# Patient Record
Sex: Male | Born: 1990 | Race: White | Hispanic: No | Marital: Married | Smoking: Current every day smoker
Health system: Southern US, Community
[De-identification: ages and names within clinical notes are randomized; demographics above are authoritative.]

## PROBLEM LIST (undated history)

## (undated) ENCOUNTER — Ambulatory Visit: Admission: EM | Payer: Medicaid Other | Source: Home / Self Care

## (undated) ENCOUNTER — Ambulatory Visit: Discharge status: Absent without leave | Payer: Self-pay

## (undated) DIAGNOSIS — M549 Dorsalgia, unspecified: Secondary | ICD-10-CM

## (undated) DIAGNOSIS — R0781 Pleurodynia: Secondary | ICD-10-CM

## (undated) DIAGNOSIS — F4310 Post-traumatic stress disorder, unspecified: Secondary | ICD-10-CM

## (undated) DIAGNOSIS — E119 Type 2 diabetes mellitus without complications: Secondary | ICD-10-CM

## (undated) DIAGNOSIS — J45909 Unspecified asthma, uncomplicated: Secondary | ICD-10-CM

## (undated) DIAGNOSIS — F419 Anxiety disorder, unspecified: Secondary | ICD-10-CM

## (undated) HISTORY — PX: TONSILLECTOMY: SUR1361

## (undated) HISTORY — PX: ADENOIDECTOMY: SUR15

---
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
## 2009-06-18 ENCOUNTER — Inpatient Hospital Stay (HOSPITAL_COMMUNITY): Admission: EM | Admit: 2009-06-18 | Discharge: 2009-06-20 | Payer: Self-pay | Admitting: Emergency Medicine

## 2009-10-10 ENCOUNTER — Emergency Department (HOSPITAL_COMMUNITY): Admission: EM | Admit: 2009-10-10 | Discharge: 2009-10-11 | Payer: Self-pay | Admitting: Emergency Medicine

## 2009-10-29 ENCOUNTER — Emergency Department (HOSPITAL_COMMUNITY): Admission: EM | Admit: 2009-10-29 | Discharge: 2009-10-29 | Payer: Self-pay | Admitting: Emergency Medicine

## 2010-04-08 ENCOUNTER — Emergency Department (HOSPITAL_COMMUNITY)
Admission: EM | Admit: 2010-04-08 | Discharge: 2010-04-08 | Payer: Self-pay | Source: Home / Self Care | Admitting: Emergency Medicine

## 2010-04-09 ENCOUNTER — Emergency Department (HOSPITAL_COMMUNITY): Admission: EM | Admit: 2010-04-09 | Discharge: 2010-03-17 | Payer: Self-pay | Admitting: Emergency Medicine

## 2010-04-09 ENCOUNTER — Emergency Department (HOSPITAL_COMMUNITY): Admission: EM | Admit: 2010-04-09 | Discharge: 2010-03-06 | Payer: Self-pay | Admitting: Emergency Medicine

## 2010-06-19 ENCOUNTER — Emergency Department (HOSPITAL_COMMUNITY)
Admission: EM | Admit: 2010-06-19 | Discharge: 2010-06-19 | Disposition: A | Discharge status: Home / Self Care | Payer: Self-pay | Attending: Emergency Medicine | Admitting: Emergency Medicine

## 2010-06-19 DIAGNOSIS — R197 Diarrhea, unspecified: Secondary | ICD-10-CM | POA: Insufficient documentation

## 2010-06-19 DIAGNOSIS — R112 Nausea with vomiting, unspecified: Secondary | ICD-10-CM | POA: Insufficient documentation

## 2010-06-24 ENCOUNTER — Emergency Department (HOSPITAL_COMMUNITY)
Admission: EM | Admit: 2010-06-24 | Discharge: 2010-06-24 | Disposition: A | Discharge status: Home / Self Care | Payer: Self-pay | Attending: Emergency Medicine | Admitting: Emergency Medicine

## 2010-06-24 DIAGNOSIS — W260XXA Contact with knife, initial encounter: Secondary | ICD-10-CM | POA: Insufficient documentation

## 2010-06-24 DIAGNOSIS — Y92009 Unspecified place in unspecified non-institutional (private) residence as the place of occurrence of the external cause: Secondary | ICD-10-CM | POA: Insufficient documentation

## 2010-06-24 DIAGNOSIS — S61409A Unspecified open wound of unspecified hand, initial encounter: Secondary | ICD-10-CM | POA: Insufficient documentation

## 2010-06-26 ENCOUNTER — Emergency Department (HOSPITAL_COMMUNITY)
Admission: EM | Admit: 2010-06-26 | Discharge: 2010-06-26 | Disposition: A | Discharge status: Home / Self Care | Payer: Self-pay | Attending: Emergency Medicine | Admitting: Emergency Medicine

## 2010-06-26 DIAGNOSIS — Z4801 Encounter for change or removal of surgical wound dressing: Secondary | ICD-10-CM | POA: Insufficient documentation

## 2010-07-10 ENCOUNTER — Emergency Department (HOSPITAL_COMMUNITY)
Admission: EM | Admit: 2010-07-10 | Discharge: 2010-07-10 | Discharge status: Against Medical Advice | Payer: Self-pay | Attending: Emergency Medicine | Admitting: Emergency Medicine

## 2010-07-10 DIAGNOSIS — M549 Dorsalgia, unspecified: Secondary | ICD-10-CM | POA: Insufficient documentation

## 2010-07-10 DIAGNOSIS — R109 Unspecified abdominal pain: Secondary | ICD-10-CM | POA: Insufficient documentation

## 2010-07-14 LAB — CBC
MCV: 89.3 fL (ref 78.0–100.0)
Platelets: 192 10*3/uL (ref 150–400)
RDW: 13.6 % (ref 11.5–15.5)
WBC: 9.5 10*3/uL (ref 4.0–10.5)

## 2010-07-14 LAB — BASIC METABOLIC PANEL
BUN: 9 mg/dL (ref 6–23)
Calcium: 9.5 mg/dL (ref 8.4–10.5)
Chloride: 104 mEq/L (ref 96–112)
Creatinine, Ser: 1 mg/dL (ref 0.4–1.5)
GFR calc non Af Amer: >60 mL/min (ref >60)

## 2010-07-14 LAB — URINALYSIS, ROUTINE W REFLEX MICROSCOPIC
Ketones, ur: NEGATIVE mg/dL
Nitrite: NEGATIVE
Protein, ur: NEGATIVE mg/dL
pH: 5.5 (ref 5.0–8.0)

## 2010-07-14 LAB — ETHANOL: Alcohol, Ethyl (B): <5 mg/dL (ref 0–10)

## 2010-07-14 LAB — DIFFERENTIAL
Basophils Absolute: 0 10*3/uL (ref 0.0–0.1)
Eosinophils Relative: 3 % (ref 0–5)
Lymphocytes Relative: 18 % (ref 12–46)
Neutrophils Relative %: 71 % (ref 43–77)

## 2010-07-14 LAB — RAPID URINE DRUG SCREEN, HOSP PERFORMED: Cocaine: NOT DETECTED

## 2010-07-20 LAB — URINALYSIS, ROUTINE W REFLEX MICROSCOPIC
Glucose, UA: NEGATIVE mg/dL
pH: 6 (ref 5.0–8.0)

## 2010-07-20 LAB — POCT I-STAT, CHEM 8
BUN: 12 mg/dL (ref 6–23)
Chloride: 105 mEq/L (ref 96–112)
Sodium: 141 mEq/L (ref 135–145)

## 2010-07-20 LAB — CK: Total CK: 118 U/L (ref 7–232)

## 2010-07-22 LAB — CULTURE, BLOOD (ROUTINE X 2): Report Status: 2212011

## 2010-07-22 LAB — CBC
HCT: 40.5 % (ref 39.0–52.0)
HCT: 46.8 % (ref 39.0–52.0)
Hemoglobin: 13.9 g/dL (ref 13.0–17.0)
MCHC: 34.2 g/dL (ref 30.0–36.0)
MCHC: 34.4 g/dL (ref 30.0–36.0)
MCV: 88.3 fL (ref 78.0–100.0)
MCV: 89.3 fL (ref 78.0–100.0)
Platelets: 224 10*3/uL (ref 150–400)
RBC: 4.59 MIL/uL (ref 4.22–5.81)
RBC: 5.25 MIL/uL (ref 4.22–5.81)
WBC: 14.4 10*3/uL — ABNORMAL HIGH (ref 4.0–10.5)

## 2010-07-22 LAB — URINALYSIS, ROUTINE W REFLEX MICROSCOPIC
Specific Gravity, Urine: 1.025 (ref 1.005–1.030)
Urobilinogen, UA: 0.2 mg/dL (ref 0.0–1.0)
pH: 5.5 (ref 5.0–8.0)

## 2010-07-22 LAB — DIFFERENTIAL
Basophils Relative: 0 % (ref 0–1)
Basophils Relative: 0 % (ref 0–1)
Eosinophils Absolute: 0.2 10*3/uL (ref 0.0–0.7)
Eosinophils Absolute: 0.4 10*3/uL (ref 0.0–0.7)
Eosinophils Relative: 1 % (ref 0–5)
Lymphs Abs: 1.2 10*3/uL (ref 0.7–4.0)
Monocytes Absolute: 1.3 10*3/uL — ABNORMAL HIGH (ref 0.1–1.0)
Monocytes Relative: 12 % (ref 3–12)
Monocytes Relative: 8 % (ref 3–12)

## 2010-07-22 LAB — COMPREHENSIVE METABOLIC PANEL
Alkaline Phosphatase: 84 U/L (ref 39–117)
BUN: 12 mg/dL (ref 6–23)
CO2: 29 mEq/L (ref 19–32)
Calcium: 8.9 mg/dL (ref 8.4–10.5)
GFR calc non Af Amer: >60 mL/min (ref >60)
Glucose, Bld: 111 mg/dL — ABNORMAL HIGH (ref 70–99)
Total Protein: 6.3 g/dL (ref 6.0–8.3)

## 2010-07-22 LAB — BASIC METABOLIC PANEL
BUN: 11 mg/dL (ref 6–23)
CO2: 30 mEq/L (ref 19–32)
Chloride: 96 mEq/L (ref 96–112)
Creatinine, Ser: 0.83 mg/dL (ref 0.4–1.5)
Potassium: 3.9 mEq/L (ref 3.5–5.1)

## 2010-07-22 LAB — VANCOMYCIN, TROUGH: Vancomycin Tr: 6.9 ug/mL — ABNORMAL LOW (ref 10.0–20.0)

## 2010-07-22 LAB — MAGNESIUM: Magnesium: 2.1 mg/dL (ref 1.5–2.5)

## 2010-07-22 LAB — URINE MICROSCOPIC-ADD ON
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
## 2010-11-10 ENCOUNTER — Encounter: Payer: Self-pay | Admitting: *Deleted

## 2010-11-10 ENCOUNTER — Emergency Department (HOSPITAL_COMMUNITY)
Admission: EM | Admit: 2010-11-10 | Discharge: 2010-11-10 | Disposition: A | Discharge status: Home / Self Care | Payer: Self-pay | Attending: Emergency Medicine | Admitting: Emergency Medicine

## 2010-11-10 ENCOUNTER — Other Ambulatory Visit: Payer: Self-pay

## 2010-11-10 DIAGNOSIS — F17200 Nicotine dependence, unspecified, uncomplicated: Secondary | ICD-10-CM | POA: Insufficient documentation

## 2010-11-10 DIAGNOSIS — R079 Chest pain, unspecified: Secondary | ICD-10-CM | POA: Insufficient documentation

## 2010-11-10 MED ORDER — IBUPROFEN 800 MG PO TABS: 800.0000 mg | ORAL_TABLET | Freq: Three times a day (TID) | ORAL | Status: AC

## 2010-11-10 MED ORDER — HYDROCODONE-ACETAMINOPHEN 5-325 MG PO TABS: 1.0000 | ORAL_TABLET | ORAL | Status: AC | PRN

## 2010-11-10 MED ORDER — HYDROCODONE-ACETAMINOPHEN 5-325 MG PO TABS
1.0000 | ORAL_TABLET | Freq: Once | ORAL | Status: AC
  Administered 2010-11-10: 1 via ORAL
  Filled 2010-11-10: qty 1

## 2010-11-10 MED ORDER — IBUPROFEN 800 MG PO TABS
800.0000 mg | ORAL_TABLET | Freq: Once | ORAL | Status: AC
  Administered 2010-11-10: 800 mg via ORAL
  Filled 2010-11-10: qty 1

## 2010-11-10 NOTE — ED Notes (Signed)
Pt was walking up 29 when started having a pressure in his chest. Some SOB & N/V

## 2010-11-10 NOTE — ED Provider Notes (Addendum)
History     Chief Complaint  Patient presents with  . Chest Pain  . Back Pain   HPI Comments: Patient was walking [place] on [address] when he began to experience sharp stabbing pains in the left side of his chest. He felt slightly nauseated. Pain seemed to increase as he walked.   Patient is a 20 y.o. male presenting with chest pain.  Chest Pain The chest pain began 3 - 5 hours ago. Chest pain occurs intermittently. The chest pain is improving. The pain is associated with exertion. At its most intense, the pain is at 7/10. The pain is currently at 6/10. The quality of the pain is described as aching, pleuritic and sharp. The pain does not radiate.  Associated symptoms include weakness.  Pertinent negatives for associated symptoms include no claudication, no diaphoresis and no near-syncope. He tried nothing for the symptoms.     History reviewed. No pertinent past medical history.  Past Surgical History  Procedure Date  . Tonsillectomy     History reviewed. No pertinent family history.  History  Substance Use Topics  . Smoking status: Current Everyday Smoker -- 0.5 packs/day for 10 years    Types: Cigarettes  . Smokeless tobacco: Current User    Types: Snuff  . Alcohol Use: Yes      Review of Systems  Constitutional: Negative for diaphoresis.  Cardiovascular: Positive for chest pain. Negative for claudication and near-syncope.  Neurological: Positive for weakness.  All other systems reviewed and are negative.    Physical Exam  BP 134/80  Pulse 80  Temp(Src) 98.5 F (36.9 C) (Oral)  Resp 16  Ht 5\' 7"  (1.702 m)  Wt 200 lb (90.719 kg)  BMI 31.32 kg/m2  SpO2 100%  Physical Exam  Constitutional: He is oriented to person, place, and time. He appears well-developed and well-nourished. No distress.  HENT:  Head: Normocephalic and atraumatic.  Right Ear: External ear normal.  Left Ear: External ear normal.  Eyes: Pupils are equal, round, and reactive to light.    Neck: Normal range of motion. Neck supple.  Cardiovascular: Normal rate, regular rhythm, normal heart sounds and intact distal pulses.   Pulmonary/Chest: Effort normal and breath sounds normal.  Musculoskeletal: Normal range of motion.  Neurological: He is alert and oriented to person, place, and time.  Skin: Skin is warm and dry.  Psychiatric: He has a normal mood and affect.     ED Course  Procedures EKG:   MDM  Date: 11/10/2010  0308  Rate:64  Rhythm: normal sinus rhythm and sinus arrhythmia  QRS Axis: normal  Intervals: normal  ST/T Wave abnormalities: normal  Conduction Disutrbances:none  Narrative Interpretation:   Old EKG Reviewed: changes noted compared with EKG of 03/06/05 non specific T wave changes no longer seen        Nicoletta Dress. Colon Branch, MD 11/10/10 0740  Nicoletta Dress. Colon Branch, MD 12/24/10 [id number]

## 2010-12-25 ENCOUNTER — Encounter (HOSPITAL_COMMUNITY): Payer: Self-pay

## 2010-12-25 ENCOUNTER — Emergency Department (HOSPITAL_COMMUNITY)
Admission: EM | Admit: 2010-12-25 | Discharge: 2010-12-25 | Disposition: A | Discharge status: Home / Self Care | Payer: Self-pay | Attending: Emergency Medicine | Admitting: Emergency Medicine

## 2010-12-25 ENCOUNTER — Emergency Department (HOSPITAL_COMMUNITY): Payer: Self-pay

## 2010-12-25 DIAGNOSIS — F17200 Nicotine dependence, unspecified, uncomplicated: Secondary | ICD-10-CM | POA: Insufficient documentation

## 2010-12-25 DIAGNOSIS — R531 Weakness: Secondary | ICD-10-CM

## 2010-12-25 DIAGNOSIS — R5383 Other fatigue: Secondary | ICD-10-CM | POA: Insufficient documentation

## 2010-12-25 DIAGNOSIS — R109 Unspecified abdominal pain: Secondary | ICD-10-CM | POA: Insufficient documentation

## 2010-12-25 DIAGNOSIS — R0789 Other chest pain: Secondary | ICD-10-CM | POA: Insufficient documentation

## 2010-12-25 DIAGNOSIS — M549 Dorsalgia, unspecified: Secondary | ICD-10-CM | POA: Insufficient documentation

## 2010-12-25 DIAGNOSIS — R51 Headache: Secondary | ICD-10-CM | POA: Insufficient documentation

## 2010-12-25 DIAGNOSIS — R5381 Other malaise: Secondary | ICD-10-CM | POA: Insufficient documentation

## 2010-12-25 DIAGNOSIS — I10 Essential (primary) hypertension: Secondary | ICD-10-CM | POA: Insufficient documentation

## 2010-12-25 DIAGNOSIS — R112 Nausea with vomiting, unspecified: Secondary | ICD-10-CM | POA: Insufficient documentation

## 2010-12-25 DIAGNOSIS — R55 Syncope and collapse: Secondary | ICD-10-CM | POA: Insufficient documentation

## 2010-12-25 HISTORY — DX: Pleurodynia: R07.81

## 2010-12-25 HISTORY — DX: Dorsalgia, unspecified: M54.9

## 2010-12-25 LAB — CBC
HCT: 47.5 % (ref 39.0–52.0)
Hemoglobin: 16.1 g/dL (ref 13.0–17.0)
MCH: 30.4 pg (ref 26.0–34.0)
MCV: 89.6 fL (ref 78.0–100.0)
Platelets: 164 10*3/uL (ref 150–400)
RBC: 5.3 MIL/uL (ref 4.22–5.81)
WBC: 11.6 10*3/uL — ABNORMAL HIGH (ref 4.0–10.5)

## 2010-12-25 LAB — COMPREHENSIVE METABOLIC PANEL
AST: 23 U/L (ref 0–37)
Albumin: 4.3 g/dL (ref 3.5–5.2)
Chloride: 105 mEq/L (ref 96–112)
Creatinine, Ser: 0.8 mg/dL (ref 0.50–1.35)
Potassium: 3.9 mEq/L (ref 3.5–5.1)
Total Bilirubin: 0.6 mg/dL (ref 0.3–1.2)
Total Protein: 7 g/dL (ref 6.0–8.3)

## 2010-12-25 LAB — DIFFERENTIAL
Eosinophils Absolute: 0.3 10*3/uL (ref 0.0–0.7)
Eosinophils Relative: 3 % (ref 0–5)
Lymphocytes Relative: 19 % (ref 12–46)
Lymphs Abs: 2.2 10*3/uL (ref 0.7–4.0)
Monocytes Absolute: 1.1 10*3/uL — ABNORMAL HIGH (ref 0.1–1.0)
Monocytes Relative: 9 % (ref 3–12)

## 2010-12-25 MED ORDER — SODIUM CHLORIDE 0.9 % IV SOLN
Freq: Once | INTRAVENOUS | Status: AC
  Administered 2010-12-25: 08:00:00 via INTRAVENOUS

## 2010-12-25 MED ORDER — SODIUM CHLORIDE 0.9 % IV BOLUS (SEPSIS)
1000.0000 mL | Freq: Once | INTRAVENOUS | Status: AC
  Administered 2010-12-25: 1000 mL via INTRAVENOUS

## 2010-12-25 MED ORDER — ONDANSETRON HCL 4 MG/2ML IJ SOLN
4.0000 mg | Freq: Once | INTRAMUSCULAR | Status: AC
  Administered 2010-12-25: 4 mg via INTRAVENOUS
  Filled 2010-12-25: qty 2
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
## 2010-12-25 NOTE — ED Notes (Signed)
Asked pt to give urine sample.  Pt says he will try.

## 2010-12-30 ENCOUNTER — Emergency Department (HOSPITAL_COMMUNITY)
Admission: EM | Admit: 2010-12-30 | Discharge: 2010-12-30 | Disposition: A | Discharge status: Home / Self Care | Payer: Self-pay | Attending: Emergency Medicine | Admitting: Emergency Medicine

## 2010-12-30 DIAGNOSIS — T6391XA Toxic effect of contact with unspecified venomous animal, accidental (unintentional), initial encounter: Secondary | ICD-10-CM | POA: Insufficient documentation

## 2010-12-30 DIAGNOSIS — T63461A Toxic effect of venom of wasps, accidental (unintentional), initial encounter: Secondary | ICD-10-CM | POA: Insufficient documentation

## 2010-12-31 ENCOUNTER — Emergency Department (HOSPITAL_BASED_OUTPATIENT_CLINIC_OR_DEPARTMENT_OTHER)
Admission: EM | Admit: 2010-12-31 | Discharge: 2010-12-31 | Disposition: A | Discharge status: Home / Self Care | Payer: Self-pay | Attending: Emergency Medicine | Admitting: Emergency Medicine

## 2010-12-31 ENCOUNTER — Encounter (HOSPITAL_BASED_OUTPATIENT_CLINIC_OR_DEPARTMENT_OTHER): Payer: Self-pay | Admitting: *Deleted

## 2010-12-31 DIAGNOSIS — M549 Dorsalgia, unspecified: Secondary | ICD-10-CM | POA: Insufficient documentation

## 2010-12-31 DIAGNOSIS — I10 Essential (primary) hypertension: Secondary | ICD-10-CM | POA: Insufficient documentation

## 2010-12-31 DIAGNOSIS — R079 Chest pain, unspecified: Secondary | ICD-10-CM | POA: Insufficient documentation

## 2010-12-31 MED ORDER — KETOROLAC TROMETHAMINE 30 MG/ML IJ SOLN
30.0000 mg | Freq: Once | INTRAMUSCULAR | Status: AC
  Administered 2010-12-31: 30 mg via INTRAMUSCULAR
  Filled 2010-12-31: qty 1
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
## 2011-01-01 ENCOUNTER — Encounter (HOSPITAL_COMMUNITY): Payer: Self-pay

## 2011-01-01 ENCOUNTER — Emergency Department (HOSPITAL_COMMUNITY)
Admission: EM | Admit: 2011-01-01 | Discharge: 2011-01-02 | Disposition: A | Discharge status: Home / Self Care | Payer: Self-pay | Attending: Emergency Medicine | Admitting: Emergency Medicine

## 2011-01-01 DIAGNOSIS — S0010XA Contusion of unspecified eyelid and periocular area, initial encounter: Secondary | ICD-10-CM | POA: Insufficient documentation

## 2011-01-01 DIAGNOSIS — I10 Essential (primary) hypertension: Secondary | ICD-10-CM | POA: Insufficient documentation

## 2011-01-01 DIAGNOSIS — M25552 Pain in left hip: Secondary | ICD-10-CM

## 2011-01-01 DIAGNOSIS — S0511XA Contusion of eyeball and orbital tissues, right eye, initial encounter: Secondary | ICD-10-CM

## 2011-01-01 DIAGNOSIS — M25559 Pain in unspecified hip: Secondary | ICD-10-CM | POA: Insufficient documentation

## 2011-01-01 DIAGNOSIS — R51 Headache: Secondary | ICD-10-CM | POA: Insufficient documentation

## 2011-01-01 DIAGNOSIS — S8390XA Sprain of unspecified site of unspecified knee, initial encounter: Secondary | ICD-10-CM

## 2011-01-01 DIAGNOSIS — IMO0002 Reserved for concepts with insufficient information to code with codable children: Secondary | ICD-10-CM | POA: Insufficient documentation

## 2011-01-01 DIAGNOSIS — F17200 Nicotine dependence, unspecified, uncomplicated: Secondary | ICD-10-CM | POA: Insufficient documentation
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
## 2011-01-02 ENCOUNTER — Emergency Department (HOSPITAL_COMMUNITY): Payer: Self-pay

## 2011-01-02 MED ORDER — HYDROCODONE-ACETAMINOPHEN 5-325 MG PO TABS: 2.0000 | ORAL_TABLET | ORAL | Status: AC | PRN

## 2011-01-02 MED ORDER — IBUPROFEN 800 MG PO TABS
800.0000 mg | ORAL_TABLET | Freq: Once | ORAL | Status: AC
  Administered 2011-01-02: 800 mg via ORAL
  Filled 2011-01-02: qty 1

## 2011-01-02 MED ORDER — IBUPROFEN 800 MG PO TABS: 800.0000 mg | ORAL_TABLET | Freq: Three times a day (TID) | ORAL | Status: AC | PRN

## 2011-01-02 MED ORDER — HYDROCODONE-ACETAMINOPHEN 5-325 MG PO TABS
2.0000 | ORAL_TABLET | Freq: Once | ORAL | Status: AC
  Administered 2011-01-02: 2 via ORAL
  Filled 2011-01-02: qty 2
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
## 2011-01-28 LAB — URINALYSIS, ROUTINE W REFLEX MICROSCOPIC
Bilirubin Urine: NEGATIVE
Glucose, UA: NEGATIVE
Hgb urine dipstick: NEGATIVE
Ketones, ur: NEGATIVE
Protein, ur: NEGATIVE

## 2011-01-28 LAB — RAPID URINE DRUG SCREEN, HOSP PERFORMED
Amphetamines: POSITIVE — AB
Barbiturates: NOT DETECTED
Benzodiazepines: NOT DETECTED
Tetrahydrocannabinol: NOT DETECTED

## 2012-10-09 ENCOUNTER — Encounter (HOSPITAL_COMMUNITY): Payer: Self-pay | Admitting: *Deleted

## 2012-10-09 ENCOUNTER — Emergency Department (HOSPITAL_COMMUNITY)
Admission: EM | Admit: 2012-10-09 | Discharge: 2012-10-10 | Disposition: A | Discharge status: Home / Self Care | Payer: Self-pay | Attending: Emergency Medicine | Admitting: Emergency Medicine

## 2012-10-09 DIAGNOSIS — L02419 Cutaneous abscess of limb, unspecified: Secondary | ICD-10-CM | POA: Insufficient documentation

## 2012-10-09 DIAGNOSIS — L089 Local infection of the skin and subcutaneous tissue, unspecified: Secondary | ICD-10-CM | POA: Insufficient documentation

## 2012-10-09 DIAGNOSIS — I10 Essential (primary) hypertension: Secondary | ICD-10-CM | POA: Insufficient documentation

## 2012-10-09 DIAGNOSIS — Y9319 Activity, other involving water and watercraft: Secondary | ICD-10-CM | POA: Insufficient documentation

## 2012-10-09 DIAGNOSIS — Z8739 Personal history of other diseases of the musculoskeletal system and connective tissue: Secondary | ICD-10-CM | POA: Insufficient documentation

## 2012-10-09 DIAGNOSIS — S80869A Insect bite (nonvenomous), unspecified lower leg, initial encounter: Secondary | ICD-10-CM | POA: Insufficient documentation

## 2012-10-09 DIAGNOSIS — Y929 Unspecified place or not applicable: Secondary | ICD-10-CM | POA: Insufficient documentation

## 2012-10-09 DIAGNOSIS — Z880 Allergy status to penicillin: Secondary | ICD-10-CM | POA: Insufficient documentation

## 2012-10-09 DIAGNOSIS — F17200 Nicotine dependence, unspecified, uncomplicated: Secondary | ICD-10-CM | POA: Insufficient documentation

## 2012-10-09 NOTE — ED Notes (Signed)
Pt states insect bite to the left calf, started feeling nausea tonight. Reports pain to the head also.

## 2012-10-10 MED ORDER — IBUPROFEN 800 MG PO TABS
800.0000 mg | ORAL_TABLET | Freq: Once | ORAL | Status: AC
  Administered 2012-10-10: 800 mg via ORAL
  Filled 2012-10-10: qty 1

## 2012-10-10 MED ORDER — SULFAMETHOXAZOLE-TRIMETHOPRIM 800-160 MG PO TABS: 1.0000 | ORAL_TABLET | Freq: Two times a day (BID) | ORAL | Status: DC

## 2012-10-10 MED ORDER — IBUPROFEN 600 MG PO TABS: 600.0000 mg | ORAL_TABLET | Freq: Three times a day (TID) | ORAL | Status: DC | PRN

## 2012-10-10 MED ORDER — LIDOCAINE HCL (PF) 1 % IJ SOLN
INTRAMUSCULAR | Status: AC
  Administered 2012-10-10: 01:00:00
  Filled 2012-10-10: qty 5

## 2012-10-10 MED ORDER — SULFAMETHOXAZOLE-TMP DS 800-160 MG PO TABS
1.0000 | ORAL_TABLET | Freq: Once | ORAL | Status: AC
  Administered 2012-10-10: 1 via ORAL
  Filled 2012-10-10: qty 1

## 2012-10-10 MED ORDER — ONDANSETRON 8 MG PO TBDP
8.0000 mg | ORAL_TABLET | Freq: Once | ORAL | Status: AC
  Administered 2012-10-10: 8 mg via ORAL
  Filled 2012-10-10: qty 1

## 2012-10-10 NOTE — ED Provider Notes (Signed)
History     CSN: [id number]  Arrival date & time 10/09/12  2253   First MD Initiated Contact with Patient 10/10/12 0024      Chief Complaint  Patient presents with  . Insect Bite    (Conside The history is provided by the patient.  suspected insect bite to left posterior calf several days ago. No with redness and was able to express a small amount of pus at home. Nausea and chills tonight. No abd pain. No fever. No hx of abscesses in the past. Symptoms are mild in severity. No other complaints  Past Medical History  Diagnosis Date  . Back pain   . Hypertension   . Rib pain     Past Surgical History  Procedure Laterality Date  . Tonsillectomy    . Adenoidectomy      Family History  Problem Relation Age of Onset  . Alcohol abuse Father   . Drug abuse Father     History  Substance Use Topics  . Smoking status: Current Every Day Smoker -- 0.50 packs/day for 10 years    Types: Cigarettes  . Smokeless tobacco: Current User    Types: Snuff  . Alcohol Use: No      Review of Systems  All other systems reviewed and are negative.    Allergies  Penicillins  Home Medications   Current Outpatient Rx  Name  Route  Sig  Dispense  Refill  . ibuprofen (ADVIL,MOTRIN) 600 MG tablet   Oral   Take 1 tablet (600 mg total) by mouth every 8 (eight) hours as needed for pain.   15 tablet   0   . sulfamethoxazole-trimethoprim (SEPTRA DS) 800-160 MG per tablet   Oral   Take 1 tablet by mouth every 12 (twelve) hours.   10 tablet   0     BP 145/97  Pulse 85  Temp(Src) 97.8 F (36.6 C) (Oral)  Resp 24  Ht 5\' 7"  (1.702 m)  Wt 229 lb 12.8 oz (104.237 kg)  BMI 35.98 kg/m2  SpO2 98%  Physical Exam  Nursing note and vitals reviewed. Constitutional: He is oriented to person, place, and time. He appears well-developed and well-nourished.  HENT:  Head: Normocephalic.  Eyes: EOM are normal.  Neck: Normal range of motion.  Pulmonary/Chest: Effort normal.  Abdominal: He  exhibits no distension.  Musculoskeletal: Normal range of motion.  Posterior left calf with small abscess and surrounding induration with cellulitis. 3cm in diameter. No drainage  Neurological: He is alert and oriented to person, place, and time.  Psychiatric: He has a normal mood and affect.    ED Course  Procedures (including critical care time) INCISION AND DRAINAGE Performed by: Lyanne Co Consent: Verbal consent obtained. Risks and benefits: risks, benefits and alternatives were discussed Time out performed prior to procedure Type: abscess Body area: left posterior calf Anesthesia: local infiltration Incision was made with a scalpel. Local anesthetic: lidocaine 1% without epinephrine Anesthetic total: 5 ml Complexity: complex Blunt dissection to break up loculations Drainage: purulent Drainage amount: small Packing material: none Patient tolerance: Patient tolerated the procedure well with no immediate complications.    Labs Reviewed - No data to display No results found.   1. Abscess of lower leg       MDM  Simple abscess with incision and drainage. abx for small surrounding cellulitis. Vitals normal        Lyanne Co, MD 10/10/12 [phone]

## 2012-10-10 NOTE — ED Notes (Signed)
Pt alert & oriented x4, stable gait. Patient given discharge instructions, paperwork & prescription(s). Patient  instructed to stop at the registration desk to finish any additional paperwork. Patient verbalized understanding. Pt left department w/ no further questions. 

## 2012-11-19 ENCOUNTER — Emergency Department (HOSPITAL_COMMUNITY)
Admission: EM | Admit: 2012-11-19 | Discharge: 2012-11-19 | Disposition: A | Discharge status: Home / Self Care | Payer: Self-pay | Attending: Emergency Medicine | Admitting: Emergency Medicine

## 2012-11-19 ENCOUNTER — Emergency Department (HOSPITAL_COMMUNITY): Payer: Self-pay

## 2012-11-19 ENCOUNTER — Encounter (HOSPITAL_COMMUNITY): Payer: Self-pay | Admitting: Emergency Medicine

## 2012-11-19 DIAGNOSIS — L0201 Cutaneous abscess of face: Secondary | ICD-10-CM | POA: Insufficient documentation

## 2012-11-19 DIAGNOSIS — Z8739 Personal history of other diseases of the musculoskeletal system and connective tissue: Secondary | ICD-10-CM | POA: Insufficient documentation

## 2012-11-19 DIAGNOSIS — M549 Dorsalgia, unspecified: Secondary | ICD-10-CM | POA: Insufficient documentation

## 2012-11-19 DIAGNOSIS — K047 Periapical abscess without sinus: Secondary | ICD-10-CM

## 2012-11-19 DIAGNOSIS — L03211 Cellulitis of face: Secondary | ICD-10-CM

## 2012-11-19 DIAGNOSIS — R000 Tachycardia, unspecified: Secondary | ICD-10-CM | POA: Insufficient documentation

## 2012-11-19 DIAGNOSIS — Z880 Allergy status to penicillin: Secondary | ICD-10-CM | POA: Insufficient documentation

## 2012-11-19 MED ORDER — FENTANYL CITRATE 0.05 MG/ML IJ SOLN
50.0000 ug | Freq: Once | INTRAMUSCULAR | Status: AC
  Administered 2012-11-19: 50 ug via INTRAVENOUS
  Filled 2012-11-19: qty 2

## 2012-11-19 MED ORDER — CLINDAMYCIN HCL 150 MG PO CAPS: ORAL_CAPSULE | ORAL | Status: DC

## 2012-11-19 MED ORDER — ONDANSETRON HCL 4 MG/2ML IJ SOLN
4.0000 mg | Freq: Once | INTRAMUSCULAR | Status: AC
  Administered 2012-11-19: 4 mg via INTRAVENOUS
  Filled 2012-11-19: qty 2

## 2012-11-19 MED ORDER — DEXTROSE 5 % IV SOLN: 900.0000 mg | Freq: Once | INTRAVENOUS | Status: DC

## 2012-11-19 MED ORDER — HYDROCODONE-ACETAMINOPHEN 7.5-325 MG PO TABS: 1.0000 | ORAL_TABLET | ORAL | Status: DC | PRN

## 2012-11-19 MED ORDER — SODIUM CHLORIDE 0.9 % IV SOLN
Freq: Once | INTRAVENOUS | Status: AC
  Administered 2012-11-19: 09:00:00 via INTRAVENOUS

## 2012-11-19 MED ORDER — CLINDAMYCIN PHOSPHATE 900 MG/50ML IV SOLN
900.0000 mg | Freq: Once | INTRAVENOUS | Status: AC
  Administered 2012-11-19: 900 mg via INTRAVENOUS
  Filled 2012-11-19: qty 50

## 2012-11-19 MED ORDER — MORPHINE SULFATE 4 MG/ML IJ SOLN
4.0000 mg | Freq: Once | INTRAMUSCULAR | Status: AC
  Administered 2012-11-19: 4 mg via INTRAVENOUS
  Filled 2012-11-19: qty 1

## 2012-11-19 MED ORDER — PROMETHAZINE HCL 25 MG/ML IJ SOLN
12.5000 mg | Freq: Once | INTRAMUSCULAR | Status: AC
  Administered 2012-11-19: 12.5 mg via INTRAVENOUS
  Filled 2012-11-19: qty 1

## 2012-11-19 MED ORDER — IOHEXOL 300 MG/ML  SOLN
80.0000 mL | Freq: Once | INTRAMUSCULAR | Status: AC | PRN
  Administered 2012-11-19: 80 mL via INTRAVENOUS

## 2012-11-19 NOTE — ED Provider Notes (Signed)
Medical screening examination/treatment/procedure(s) were performed by non-physician practitioner and as supervising physician I was immediately available for consultation/collaboration.   Benny Lennert, MD 11/19/12 [phone]

## 2012-11-19 NOTE — ED Notes (Signed)
Pt c/o dental pain, right side facial swelling that started this am,

## 2012-11-19 NOTE — ED Notes (Signed)
Pt states that the pain is starting to return, [place] PA notified,

## 2012-11-19 NOTE — ED Notes (Addendum)
Pt eating and broke upper right tooth into pieces 2 days ago. Swelling started yesterday. Pt awoke this am with right side facial swelling and had to open r eye with hand due to swelling. Moderate amount of swelling noted to r face from lower jaw to under right eye and slight swelling noted to top of right eye. Hard to swallow.

## 2012-11-19 NOTE — ED Provider Notes (Signed)
History    CSN: [id number] Arrival date & time 11/19/12  3474  First MD Initiated Contact with Patient 11/19/12 [phone]     Chief Complaint  Patient presents with  . Facial Swelling  . Dental Pain   (Consider location/radiation/quality/duration/timing/severity/associated sxs/prior Treatment) Patient is a 22 y.o. male presenting with tooth pain. The history is provided by the patient.  Dental Pain Location:  Upper Quality:  Aching, throbbing and shooting Severity:  Severe Onset quality:  Sudden Duration:  3 days Timing:  Constant Progression:  Worsening Chronicity:  New Context: dental caries, dental fracture and poor dentition   Relieved by:  Nothing Worsened by:  Nothing tried Ineffective treatments:  Acetaminophen and topical anesthetic gel Associated symptoms: facial pain and facial swelling   Associated symptoms: no neck pain, no neck swelling and no trismus   Risk factors: smoking   Risk factors comment:  Snuff use  Past Medical History  Diagnosis Date  . Back pain   . Rib pain    Past Surgical History  Procedure Laterality Date  . Tonsillectomy    . Adenoidectomy     Family History  Problem Relation Age of Onset  . Alcohol abuse Father   . Drug abuse Father    History  Substance Use Topics  . Smoking status: Current Every Day Smoker -- 0.50 packs/day for 10 years    Types: Cigarettes  . Smokeless tobacco: Current User    Types: Snuff  . Alcohol Use: No    Review of Systems  Constitutional: Negative for activity change.       All ROS Neg except as noted in HPI  HENT: Positive for facial swelling and dental problem. Negative for nosebleeds and neck pain.   Eyes: Negative for photophobia and discharge.  Respiratory: Negative for cough, shortness of breath and wheezing.   Cardiovascular: Negative for chest pain and palpitations.  Gastrointestinal: Negative for abdominal pain and blood in stool.  Genitourinary: Negative for dysuria, frequency and  hematuria.  Musculoskeletal: Positive for back pain. Negative for arthralgias.  Skin: Negative.   Neurological: Negative for dizziness, seizures and speech difficulty.  Psychiatric/Behavioral: Negative for hallucinations and confusion.    Allergies  Penicillins  Home Medications   Current Outpatient Rx  Name  Route  Sig  Dispense  Refill  . ibuprofen (ADVIL,MOTRIN) 600 MG tablet   Oral   Take 1 tablet (600 mg total) by mouth every 8 (eight) hours as needed for pain.   15 tablet   0   . sulfamethoxazole-trimethoprim (SEPTRA DS) 800-160 MG per tablet   Oral   Take 1 tablet by mouth every 12 (twelve) hours.   10 tablet   0    BP 133/74  Pulse 114  Temp(Src) 99.1 F (37.3 C) (Oral)  Resp 19  SpO2 96% Physical Exam  Nursing note and vitals reviewed. Constitutional: He is oriented to person, place, and time. He appears well-developed and well-nourished.  Non-toxic appearance.  HENT:  Head: Normocephalic.  Right Ear: Tympanic membrane and external ear normal.  Left Ear: Tympanic membrane and external ear normal.  There is swelling of the right face from the lower angle of the jaw to the right eye. There is tenderness to palpation around the upper and lower gums. Gums are swollen. There multiple dental caries appreciated. Right side more than left. There is no swelling under the tongue. The airway is patent. The patient speaks in complete sentences.  Eyes: EOM and lids are normal. Pupils are  equal, round, and reactive to light.  The upper and lower lid of the right eye are swollen the lower lid is swollen more than the upper lid. The extraocular movements are intact. The anterior chamber is clear.  Neck: Normal range of motion. Neck supple. Carotid bruit is not present.  Cardiovascular: Regular rhythm, normal heart sounds, intact distal pulses and normal pulses.  Tachycardia present.   Pulmonary/Chest: Breath sounds normal. No respiratory distress.  Abdominal: Soft. Bowel sounds  are normal. There is no tenderness. There is no guarding.  Musculoskeletal: Normal range of motion.  Lymphadenopathy:       Head (right side): No submandibular adenopathy present.       Head (left side): No submandibular adenopathy present.    He has no cervical adenopathy.  Neurological: He is alert and oriented to person, place, and time. He has normal strength. No cranial nerve deficit or sensory deficit.  Skin: Skin is warm and dry.  Psychiatric: He has a normal mood and affect. His speech is normal.    ED Course  Procedures (including critical care time) Labs Reviewed - No data to display No results found. No diagnosis found.  MDM  *I have reviewed nursing notes, vital signs, and all appropriate lab and imaging results for this patient.** Patient presents to the emergency department with a tachycardia of 114 beats a minute, right facial pain, and some mild temperature elevation of 99.1. He has swelling of the gums, and evidence of poor dentition.  A CT maxillofacial scan was obtained and reveals a small right upper second bicuspid abscess. It is also cellulitis involving the right face with marked right-sided facial swelling. The patient was treated in the emergency department with IV clindamycin. He was also treated for pain with fentanyl. The patient feels much better at the time of discharge.  Prescription given for clindamycin, ibuprofen 3 times daily, and Norco 7.5 mg one every 4 hours as needed for pain #20 tablets. Patient strongly encouraged to see a dentist as sone as possible. Dental resources packet when given to the patient.  Kathie Dike, PA-C 11/19/12 1236

## 2013-01-21 ENCOUNTER — Encounter (HOSPITAL_COMMUNITY): Payer: Self-pay | Admitting: *Deleted

## 2013-01-21 ENCOUNTER — Emergency Department (HOSPITAL_COMMUNITY)
Admission: EM | Admit: 2013-01-21 | Discharge: 2013-01-21 | Disposition: A | Discharge status: Home / Self Care | Payer: Self-pay | Attending: Emergency Medicine | Admitting: Emergency Medicine

## 2013-01-21 DIAGNOSIS — Y9389 Activity, other specified: Secondary | ICD-10-CM | POA: Insufficient documentation

## 2013-01-21 DIAGNOSIS — Y929 Unspecified place or not applicable: Secondary | ICD-10-CM | POA: Insufficient documentation

## 2013-01-21 DIAGNOSIS — L089 Local infection of the skin and subcutaneous tissue, unspecified: Secondary | ICD-10-CM | POA: Insufficient documentation

## 2013-01-21 DIAGNOSIS — Z23 Encounter for immunization: Secondary | ICD-10-CM | POA: Insufficient documentation

## 2013-01-21 DIAGNOSIS — T63391A Toxic effect of venom of other spider, accidental (unintentional), initial encounter: Secondary | ICD-10-CM | POA: Insufficient documentation

## 2013-01-21 DIAGNOSIS — F17200 Nicotine dependence, unspecified, uncomplicated: Secondary | ICD-10-CM | POA: Insufficient documentation

## 2013-01-21 DIAGNOSIS — IMO0002 Reserved for concepts with insufficient information to code with codable children: Secondary | ICD-10-CM | POA: Insufficient documentation

## 2013-01-21 DIAGNOSIS — Z880 Allergy status to penicillin: Secondary | ICD-10-CM | POA: Insufficient documentation

## 2013-01-21 MED ORDER — TETANUS-DIPHTH-ACELL PERTUSSIS 5-2.5-18.5 LF-MCG/0.5 IM SUSP
0.5000 mL | Freq: Once | INTRAMUSCULAR | Status: AC
  Administered 2013-01-21: 0.5 mL via INTRAMUSCULAR
  Filled 2013-01-21: qty 0.5

## 2013-01-21 MED ORDER — CIPROFLOXACIN HCL 250 MG PO TABS
750.0000 mg | ORAL_TABLET | Freq: Once | ORAL | Status: AC
  Administered 2013-01-21: 750 mg via ORAL
  Filled 2013-01-21: qty 3

## 2013-01-21 MED ORDER — HYDROCODONE-ACETAMINOPHEN 7.5-325 MG PO TABS: 1.0000 | ORAL_TABLET | ORAL | Status: DC | PRN

## 2013-01-21 MED ORDER — ONDANSETRON HCL 4 MG PO TABS
4.0000 mg | ORAL_TABLET | Freq: Once | ORAL | Status: AC
  Administered 2013-01-21: 4 mg via ORAL
  Filled 2013-01-21: qty 1

## 2013-01-21 MED ORDER — LIDOCAINE HCL (PF) 1 % IJ SOLN: 5.0000 mL | Freq: Once | INTRAMUSCULAR | Status: DC

## 2013-01-21 MED ORDER — LIDOCAINE HCL (PF) 1 % IJ SOLN
INTRAMUSCULAR | Status: AC
  Administered 2013-01-21: 17:00:00
  Filled 2013-01-21: qty 5

## 2013-01-21 MED ORDER — CIPROFLOXACIN HCL 500 MG PO TABS: 500.0000 mg | ORAL_TABLET | Freq: Two times a day (BID) | ORAL | Status: DC

## 2013-01-21 MED ORDER — KETOROLAC TROMETHAMINE 10 MG PO TABS
10.0000 mg | ORAL_TABLET | Freq: Once | ORAL | Status: AC
  Administered 2013-01-21: 10 mg via ORAL
  Filled 2013-01-21: qty 1

## 2013-01-21 NOTE — ED Notes (Signed)
Pt states that he was bitten by a spider while cleaning out an outside building, pt has open wound noted to left forearm area with redness noted around the area,.

## 2013-01-21 NOTE — ED Provider Notes (Signed)
CSN: [id number]     Arrival date & time 01/21/13  1437 History   First MD Initiated Contact with Patient 01/21/13 1555     Chief Complaint  Patient presents with  . Insect Bite   (Consider location/radiation/quality/duration/timing/severity/associated sxs/prior Treatment) HPI Comments: The patient states that on Monday, September 15 he was cleaning out a outside building when he felt a sustained, followed by seeing a spider trying to go up his arm. On the next day he noticed increased redness and some drainage from the center of the sting area. Since that time he's been noticing a scab in the center and drainage and increased redness around the scab. The problem seems to be getting progressively worse, and the patient is concerned for a brown recluse spider bite. The patient is unsure of the date of his last tetanus. He has not had any high fever. He states however he feels a fullness in his wrist and forearm when he flexes his fingers and wrist. Patient presents at this time for evaluation of this problem.  The history is provided by the patient.    Past Medical History  Diagnosis Date  . Back pain   . Rib pain    Past Surgical History  Procedure Laterality Date  . Tonsillectomy    . Adenoidectomy     Family History  Problem Relation Age of Onset  . Alcohol abuse Father   . Drug abuse Father    History  Substance Use Topics  . Smoking status: Current Every Day Smoker -- 0.50 packs/day for 10 years    Types: Cigarettes  . Smokeless tobacco: Current User    Types: Snuff  . Alcohol Use: No    Review of Systems  Constitutional: Negative for activity change.       All ROS Neg except as noted in HPI  HENT: Negative for nosebleeds and neck pain.   Eyes: Negative for photophobia and discharge.  Respiratory: Negative for cough, shortness of breath and wheezing.   Cardiovascular: Negative for chest pain and palpitations.  Gastrointestinal: Negative for abdominal pain and blood in  stool.  Genitourinary: Negative for dysuria, frequency and hematuria.  Musculoskeletal: Positive for back pain. Negative for arthralgias.  Skin: Positive for wound.  Neurological: Negative for dizziness, seizures and speech difficulty.  Psychiatric/Behavioral: Negative for hallucinations and confusion.    Allergies  Amoxicillin and Penicillins  Home Medications   Current Outpatient Rx  Name  Route  Sig  Dispense  Refill  . acetaminophen (TYLENOL) 500 MG tablet   Oral   Take 1,000 mg by mouth every 6 (six) hours as needed for pain.          BP 136/80  Pulse 86  Temp(Src) 98.2 F (36.8 C) (Oral)  Resp 20  Wt 227 lb 4 oz (103.08 kg)  BMI 35.58 kg/m2  SpO2 99% Physical Exam  Nursing note and vitals reviewed. Constitutional: He is oriented to person, place, and time. He appears well-developed and well-nourished.  Non-toxic appearance.  HENT:  Head: Normocephalic.  Right Ear: Tympanic membrane and external ear normal.  Left Ear: Tympanic membrane and external ear normal.  Eyes: EOM and lids are normal. Pupils are equal, round, and reactive to light.  Neck: Normal range of motion. Neck supple. Carotid bruit is not present.  Cardiovascular: Normal rate, regular rhythm, normal heart sounds, intact distal pulses and normal pulses.   Pulmonary/Chest: Breath sounds normal. No respiratory distress.  Abdominal: Soft. Bowel sounds are normal. There is no  tenderness. There is no guarding.  Musculoskeletal: Normal range of motion.  There is a 1 cm scabbed area of the ulnar surface of the left forearm. This is surrounded by increasing redness. There is minimal drainage from the scabbed area. There is no red streaking going up the arm. There is full range of motion of the fingers and wrist of the left hand. There is full range of motion of the elbow and shoulders of the left upper extremity. There no palpable nodes of the axilla area.  Lymphadenopathy:       Head (right side): No  submandibular adenopathy present.       Head (left side): No submandibular adenopathy present.    He has no cervical adenopathy.  Neurological: He is alert and oriented to person, place, and time. He has normal strength. No cranial nerve deficit or sensory deficit.  Skin: Skin is warm and dry.  Psychiatric: He has a normal mood and affect. His speech is normal.    ED Course  Procedures - DBRIDEMENT OF LEFT FOREARM WOUND - patient identified by arm band. Permission for the procedure given by the patient. Procedural time out taken before debridement of wound on the left forearm.  The procedure was explained to the patient in terms which he understands.  The upper portion of the left forearm was painted with Betadine. The affected site was infiltrated with 1% plain lidocaine. After adequate anesthetic, the patient was draped in the usual sterile manner. Using a 15 scalpel, the scabbed and the edges and at the inner aspect of the wound were scraped. Mild debris was removed. There was no necrotic tissue appreciated. There was minimal pus like material present. A culture was obtained and sent to the lab. The area was irrigated with sterile saline. Following this a sterile dressing was applied by me.  Following the procedure the patient had full range of motion of the fingers. Capillary refill is less than 2 seconds. Patient tolerated the procedure without problem.  Labs Review Labs Reviewed - No data to display Imaging Review No results found.  MDM  No diagnosis found. *I have reviewed nursing notes, vital signs, and all appropriate lab and imaging results for this patient.** Patient sustained what he believes to be a spider bite a few days ago and now he has a wound that seems to be getting deeper and also has a scab on it with some mild drainage present. The patient has increased redness around the wound site. He has some tight feeling when he moves his fingers and when he moves his wrists at  times.   a culture of the wound has been obtained. I have placed the patient on Cipro 500 mg twice a day, ibuprofen 3 times daily, and a prescription for Norco is also been given every 4 hours. The patient is to see his primary physician or return to the emergency department on Wednesday, September 24 for recheck of his wound.  Kathie Dike, PA-C 01/21/13 1722

## 2013-01-21 NOTE — ED Notes (Signed)
Pt says spider bite to lt wrist 6 days ago.area of redness size of .50 cent piece and ulceration in center

## 2013-01-22 NOTE — ED Provider Notes (Signed)
Medical screening examination/treatment/procedure(s) were performed by non-physician practitioner and as supervising physician I was immediately available for consultation/collaboration.   Hurman Horn, MD 01/22/13 [phone]

## 2013-01-24 ENCOUNTER — Encounter (HOSPITAL_COMMUNITY): Payer: Self-pay | Admitting: Emergency Medicine

## 2013-01-24 ENCOUNTER — Emergency Department (HOSPITAL_COMMUNITY)
Admission: EM | Admit: 2013-01-24 | Discharge: 2013-01-24 | Disposition: A | Discharge status: Home / Self Care | Payer: Self-pay | Attending: Emergency Medicine | Admitting: Emergency Medicine

## 2013-01-24 DIAGNOSIS — Z5189 Encounter for other specified aftercare: Secondary | ICD-10-CM

## 2013-01-24 DIAGNOSIS — Z4800 Encounter for change or removal of nonsurgical wound dressing: Secondary | ICD-10-CM | POA: Insufficient documentation

## 2013-01-24 DIAGNOSIS — Z792 Long term (current) use of antibiotics: Secondary | ICD-10-CM | POA: Insufficient documentation

## 2013-01-24 DIAGNOSIS — Z880 Allergy status to penicillin: Secondary | ICD-10-CM | POA: Insufficient documentation

## 2013-01-24 DIAGNOSIS — F17200 Nicotine dependence, unspecified, uncomplicated: Secondary | ICD-10-CM | POA: Insufficient documentation

## 2013-01-24 LAB — WOUND CULTURE

## 2013-01-24 NOTE — ED Provider Notes (Signed)
CSN: [id number]     Arrival date & time 01/24/13  7829 History   First MD Initiated Contact with Patient 01/24/13 [phone]     Chief Complaint  Patient presents with  . Wound Check   (Consider location/radiation/quality/duration/timing/severity/associated sxs/prior Treatment) HPI Comments: Charles [name] is a 22 y.o. Male presenting for a wound check.  He was bit by a brown recluse one week ago and was treated here for an ulcerated wound infection with ciprofloxacin which he is still taking. He reports vast improvement in the pain and swelling at the wound site.  He still has an ulceration which does drain a small amount of bloody discharge but is starting to get smaller.  He denies radiation of pain and has had no fevers, chills, nausea, abdominal pain or cramping.  He does report a prior history of mrsa.  His wound culture obtained last week grew mrsa which is sensitive to bactrim,  But also to levaquin.     The history is provided by the patient.    Past Medical History  Diagnosis Date  . Back pain   . Rib pain    Past Surgical History  Procedure Laterality Date  . Tonsillectomy    . Adenoidectomy     Family History  Problem Relation Age of Onset  . Alcohol abuse Father   . Drug abuse Father    History  Substance Use Topics  . Smoking status: Current Every Day Smoker -- 0.50 packs/day for 10 years    Types: Cigarettes  . Smokeless tobacco: Current User    Types: Snuff  . Alcohol Use: No    Review of Systems  Constitutional: Negative for fever and chills.  HENT: Negative for facial swelling.   Respiratory: Negative for shortness of breath and wheezing.   Skin: Positive for wound.  Neurological: Negative for numbness.    Allergies  Amoxicillin and Penicillins  Home Medications   Current Outpatient Rx  Name  Route  Sig  Dispense  Refill  . acetaminophen (TYLENOL) 500 MG tablet   Oral   Take 1,000 mg by mouth every 6 (six) hours as needed for pain.         .  ciprofloxacin (CIPRO) 500 MG tablet   Oral   Take 1 tablet (500 mg total) by mouth 2 (two) times daily.   14 tablet   0   . HYDROcodone-acetaminophen (NORCO) 7.5-325 MG per tablet   Oral   Take 1 tablet by mouth every 4 (four) hours as needed for pain.   20 tablet   0    BP 133/82  Pulse 79  Temp(Src) 98.6 F (37 C) (Oral)  Resp 16  Ht 5\' 7"  (1.702 m)  Wt 227 lb (102.967 kg)  BMI 35.55 kg/m2  SpO2 96% Physical Exam  Constitutional: He appears well-developed and well-nourished. No distress.  HENT:  Head: Normocephalic.  Neck: Neck supple.  Cardiovascular: Normal rate.   Pulmonary/Chest: Effort normal. He has no wheezes.  Musculoskeletal: Normal range of motion. He exhibits no edema.  Skin:  Quarter sized ulcer right forearm,  No surrounding edema or erythema.  Wound is clean with pink,  Margins,  Granulomatous wound edge changes.  No purulent drainage.  No red streaking.    ED Course  Procedures (including critical care time) Labs Review Labs Reviewed - No data to display Imaging Review No results found.  MDM   1. Encounter for wound re-check    Culture reviewed and mrsa sensitive to  quinolones.  No abx added as pt is improved.  Discussed with Dr Adriana Simas prior to dc.  Wash bid,  Keep covered.  Recheck one week if this does not continue to heal/ ulcer not getting smaller.    Burgess Amor, PA-C 01/24/13 1015

## 2013-01-24 NOTE — ED Provider Notes (Signed)
Medical screening examination/treatment/procedure(s) were conducted as a shared visit with non-physician practitioner(s) and myself.  I personally evaluated the patient during the encounter.  Good wound granulation tissue. Cellulitis is improving  Donnetta Hutching, MD 01/24/13 1521

## 2013-01-24 NOTE — ED Notes (Signed)
Patient here for recheck. Per patient bite by brown recluse on left forearm. Patient states he was seen here last week and told to come back today. Denies any fevers. Per patient area looks better-reports scant amount of blood with changing dressings.. Wound dressed with gauze wrap and medical tape.

## 2013-01-24 NOTE — ED Notes (Signed)
+   [facility]SA Patient treated with cipro-STS-Patient returned for recheck.

## 2013-01-26 ENCOUNTER — Telehealth (HOSPITAL_COMMUNITY): Payer: Self-pay | Admitting: Emergency Medicine

## 2013-01-26 NOTE — ED Notes (Signed)
Post ED Visit - Positive Culture Follow-up  Culture report reviewed by antimicrobial stewardship pharmacist: []  Wes Dulaney, Pharm.D., BCPS [x]  Celedonio Miyamoto, Pharm.D., BCPS []  Georgina Pillion, Pharm.D., BCPS []  [place], [place].D., BCPS, AAHIVP []  Estella Husk, Pharm.D., BCPS, AAHIVP  Positive wound culture Treated with Cipro, organism sensitive to the same and no further patient follow-up is required at this time.  Kylie A Holland 01/26/2013, 1:02 PM

## 2014-07-14 ENCOUNTER — Emergency Department (HOSPITAL_COMMUNITY)
Admission: EM | Admit: 2014-07-14 | Discharge: 2014-07-14 | Disposition: A | Discharge status: Home / Self Care | Payer: Self-pay | Attending: Emergency Medicine | Admitting: Emergency Medicine

## 2014-07-14 ENCOUNTER — Emergency Department (HOSPITAL_COMMUNITY): Payer: Self-pay

## 2014-07-14 ENCOUNTER — Encounter (HOSPITAL_COMMUNITY): Payer: Self-pay | Admitting: Emergency Medicine

## 2014-07-14 DIAGNOSIS — Z792 Long term (current) use of antibiotics: Secondary | ICD-10-CM | POA: Insufficient documentation

## 2014-07-14 DIAGNOSIS — Z880 Allergy status to penicillin: Secondary | ICD-10-CM | POA: Insufficient documentation

## 2014-07-14 DIAGNOSIS — Z720 Tobacco use: Secondary | ICD-10-CM | POA: Insufficient documentation

## 2014-07-14 DIAGNOSIS — L02221 Furuncle of abdominal wall: Secondary | ICD-10-CM | POA: Insufficient documentation

## 2014-07-14 DIAGNOSIS — R1031 Right lower quadrant pain: Secondary | ICD-10-CM | POA: Insufficient documentation

## 2014-07-14 LAB — COMPREHENSIVE METABOLIC PANEL
ALT: 35 U/L (ref 0–53)
ANION GAP: 6 (ref 5–15)
AST: 30 U/L (ref 0–37)
Albumin: 4.4 g/dL (ref 3.5–5.2)
Alkaline Phosphatase: 77 U/L (ref 39–117)
BUN: 17 mg/dL (ref 6–23)
CALCIUM: 9.4 mg/dL (ref 8.4–10.5)
CHLORIDE: 102 mmol/L (ref 96–112)
CO2: 29 mmol/L (ref 19–32)
CREATININE: 0.99 mg/dL (ref 0.50–1.35)
GFR calc non Af Amer: >90 mL/min (ref >90)
Glucose, Bld: 93 mg/dL (ref 70–99)
Potassium: 4.4 mmol/L (ref 3.5–5.1)
Sodium: 137 mmol/L (ref 135–145)
Total Bilirubin: 0.6 mg/dL (ref 0.3–1.2)
Total Protein: 7.5 g/dL (ref 6.0–8.3)

## 2014-07-14 LAB — CBC WITH DIFFERENTIAL/PLATELET
BASOS PCT: 0 % (ref 0–1)
Basophils Absolute: 0 10*3/uL (ref 0.0–0.1)
EOS ABS: 0.3 10*3/uL (ref 0.0–0.7)
Eosinophils Relative: 2 % (ref 0–5)
HCT: 48.3 % (ref 39.0–52.0)
HEMOGLOBIN: 16.7 g/dL (ref 13.0–17.0)
LYMPHS PCT: 4 % — AB (ref 12–46)
Lymphs Abs: 0.6 10*3/uL — ABNORMAL LOW (ref 0.7–4.0)
MCH: 31 pg (ref 26.0–34.0)
MCHC: 34.6 g/dL (ref 30.0–36.0)
MCV: 89.8 fL (ref 78.0–100.0)
MONO ABS: 0.8 10*3/uL (ref 0.1–1.0)
Monocytes Relative: 5 % (ref 3–12)
NEUTROS ABS: 15.1 10*3/uL — AB (ref 1.7–7.7)
Neutrophils Relative %: 89 % — ABNORMAL HIGH (ref 43–77)
PLATELETS: 177 10*3/uL (ref 150–400)
RBC: 5.38 MIL/uL (ref 4.22–5.81)
RDW: 12.6 % (ref 11.5–15.5)
WBC: 16.8 10*3/uL — AB (ref 4.0–10.5)

## 2014-07-14 LAB — URINALYSIS, ROUTINE W REFLEX MICROSCOPIC
Bilirubin Urine: NEGATIVE
Glucose, UA: NEGATIVE mg/dL
HGB URINE DIPSTICK: NEGATIVE
KETONES UR: NEGATIVE mg/dL
Leukocytes, UA: NEGATIVE
Nitrite: NEGATIVE
Protein, ur: NEGATIVE mg/dL
Specific Gravity, Urine: 1.025 (ref 1.005–1.030)
Urobilinogen, UA: 0.2 mg/dL (ref 0.0–1.0)
pH: 5.5 (ref 5.0–8.0)

## 2014-07-14 LAB — LACTIC ACID, PLASMA: Lactic Acid, Venous: 1.1 mmol/L (ref 0.5–2.0)

## 2014-07-14 MED ORDER — IBUPROFEN 400 MG PO TABS
600.0000 mg | ORAL_TABLET | Freq: Once | ORAL | Status: AC
  Administered 2014-07-14: 600 mg via ORAL
  Filled 2014-07-14: qty 2

## 2014-07-14 MED ORDER — IOHEXOL 300 MG/ML  SOLN
100.0000 mL | Freq: Once | INTRAMUSCULAR | Status: AC | PRN
  Administered 2014-07-14: 100 mL via INTRAVENOUS

## 2014-07-14 MED ORDER — MORPHINE SULFATE 4 MG/ML IJ SOLN
6.0000 mg | Freq: Once | INTRAMUSCULAR | Status: AC
  Administered 2014-07-14: 6 mg via INTRAVENOUS
  Filled 2014-07-14: qty 2

## 2014-07-14 MED ORDER — SODIUM CHLORIDE 0.9 % IV BOLUS (SEPSIS)
1000.0000 mL | Freq: Once | INTRAVENOUS | Status: AC
  Administered 2014-07-14: 1000 mL via INTRAVENOUS

## 2014-07-14 NOTE — ED Notes (Signed)
Pt c/o right lower quad abd pain that started this am, denies any n/v/d, last ate this morning chicken around 10-11am, Dr Jodi MourningZavitz in prior to RN, see edp assessment for further,

## 2014-07-14 NOTE — Discharge Instructions (Signed)
Take ibuprofen and Tylenol for pain. Return for fevers, worsening pain or new concerns. If you were given medicines take as directed.  If you are on coumadin or contraceptives realize their levels and effectiveness is altered by many different medicines.  If you have any reaction (rash, tongues swelling, other) to the medicines stop taking and see a physician.   Please follow up as directed and return to the ER or see a physician for new or worsening symptoms.  Thank you. Filed Vitals:   07/14/14 1305  BP: 140/86  Pulse: 97  Temp: 98.7 F (37.1 C)  TempSrc: Oral  Resp: 18  Height: 5\' 7"  (1.702 m)  Weight: 236 lb (107.049 kg)  SpO2: 100%

## 2014-07-14 NOTE — ED Notes (Signed)
Pt reports RLQ pain that started this am. NO emesis or diarrhea.

## 2014-07-14 NOTE — ED Notes (Addendum)
Pt requesting something for pain, Dr Jodi MourningZavitz notified, additional orders given

## 2014-07-14 NOTE — ED Provider Notes (Signed)
CSN: [id number]     Arrival date & time 07/14/14  1301 History  This chart was scribed for Blane Ohara, MD by Ronney Lion, ED Scribe. This patient was seen in room APA09/APA09 and the patient's care was started at 2:06 PM.    Chief Complaint  Patient presents with  . Abdominal Pain   Patient is a 24 y.o. male presenting with abdominal pain. The history is provided by the patient and a parent. No language interpreter was used.  Abdominal Pain Pain location:  RLQ Pain quality: sharp   Pain radiates to:  Does not radiate Pain severity:  Severe Onset quality:  Sudden Duration:  4 hours Timing:  Constant Progression:  Worsening Chronicity:  New Context: awakening from sleep   Relieved by:  Nothing Worsened by:  Nothing tried Ineffective treatments:  None tried Associated symptoms: no chest pain, no diarrhea, no shortness of breath and no vomiting      HPI Comments: Charles [name] is a 24 y.o. male who presents to the Emergency Department complaining of sudden onset, sharp abdominal pain that began when patient woke up about 4 hours ago. Patient endorses a subjective fever, but denies measuring an actual fever over 100.4. Patient's mom reports patient has boils on his abdomen and wonders if they may be causing his symptoms. Movement and bumps in the road exacerbate the pain.  Patient has a history of tonsillectomy and adenoidectomy. He denies vomiting or diarrhea.    Past Medical History  Diagnosis Date  . Back pain   . Rib pain    Past Surgical History  Procedure Laterality Date  . Tonsillectomy    . Adenoidectomy     Family History  Problem Relation Age of Onset  . Alcohol abuse Father   . Drug abuse Father    History  Substance Use Topics  . Smoking status: Current Every Day Smoker -- 0.25 packs/day for 10 years    Types: Cigarettes  . Smokeless tobacco: Former Neurosurgeon    Types: Snuff  . Alcohol Use: No    Review of Systems  Respiratory: Negative for shortness of breath.    Cardiovascular: Negative for chest pain.  Gastrointestinal: Positive for abdominal pain. Negative for vomiting and diarrhea.  All other systems reviewed and are negative.  Allergies  Amoxicillin and Penicillins  Home Medications   Prior to Admission medications   Medication Sig Start Date End Date Taking? Authorizing Provider  ciprofloxacin (CIPRO) 500 MG tablet Take 1 tablet (500 mg total) by mouth 2 (two) times daily. Patient not taking: Reported on 07/14/2014 01/21/13   Ivery Quale, PA-C  HYDROcodone-acetaminophen (NORCO) 7.5-325 MG per tablet Take 1 tablet by mouth every 4 (four) hours as needed for pain. 01/21/13   Ivery Quale, PA-C   BP 140/86 mmHg  Pulse 97  Temp(Src) 98.7 F (37.1 C) (Oral)  Resp 18  Ht  (1.702 m)  Wt 236 lb (107.049 kg)  BMI 36.95 kg/m2  SpO2 100% Physical Exam  Constitutional: He is oriented to person, place, and time. He appears well-developed and well-nourished. No distress.  HENT:  Head: Normocephalic and atraumatic.  Mild dry mm.  Eyes: Conjunctivae and EOM are normal. Pupils are equal, round, and reactive to light.  No scleral icterus.  Neck: Neck supple. No tracheal deviation present.  Cardiovascular: Normal rate.   Pulmonary/Chest: Effort normal and breath sounds normal. No respiratory distress.  Abdominal: There is tenderness.  RLQ tenderness. 0.5 cm superficial boil in mid abdomen. No  surround induration, warmth, or erythema.  Musculoskeletal: Normal range of motion.  Neurological: He is alert and oriented to person, place, and time.  Skin: Skin is warm and dry.  Psychiatric: He has a normal mood and affect. His behavior is normal.  Nursing note and vitals reviewed.   ED Course  Procedures (including critical care time)  DIAGNOSTIC STUDIES: Oxygen Saturation is 100% on room air, normal by my interpretation.    COORDINATION OF CARE: 2:08 PM - Discussed treatment plan with pt at bedside which includes test for appendicitis  and pain medication, and pt agreed to plan.   Labs Review Labs Reviewed  CBC WITH DIFFERENTIAL/PLATELET - Abnormal; Notable for the following:    WBC 16.8 (*)    Neutrophils Relative % 89 (*)    Neutro Abs 15.1 (*)    Lymphocytes Relative 4 (*)    Lymphs Abs 0.6 (*)    All other components within normal limits  COMPREHENSIVE METABOLIC PANEL  LACTIC ACID, PLASMA  URINALYSIS, ROUTINE W REFLEX MICROSCOPIC  LACTIC ACID, PLASMA    Imaging Review Ct Abdomen Pelvis W Contrast  07/14/2014   CLINICAL DATA:  Right lower quadrant pain for hours. No vomiting or diarrhea.  EXAM: CT ABDOMEN AND PELVIS WITH CONTRAST  TECHNIQUE: Multidetector CT imaging of the abdomen and pelvis was performed using the standard protocol following bolus administration of intravenous contrast.  CONTRAST:  100mL OMNIPAQUE IOHEXOL 300 MG/ML  SOLN  COMPARISON:  Acute abdominal series 01/27/2011  FINDINGS: Lung bases demonstrate several small bilateral pulmonary nodules the largest which measures 7 mm over the right lower lobe. Small calcified node over the right hilar region.  Abdominal images demonstrate a normal liver, spleen, pancreas, gallbladder and adrenal glands. Kidneys and ureters are normal. The appendix is normal. Vascular structures and mesentery are normal. Large small bowel are normal.  Pelvic images demonstrate the bladder, prostate and rectum to be normal. Remaining bones and soft tissues are unremarkable.  IMPRESSION: No acute findings in the abdomen/pelvis.  Several small bilateral pulmonary nodules within the lung bases with the largest measuring 7 mm over the right lower lobe. Small calcified right hilar lymph node. 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 Recommend a follow-up noncontrast chest CT in 3 months. This recommendation follows the consensus statement: Guidelines for Management of Small Pulmonary Nodules Detected on CT Scans: A Statement from the Fleischner Society as published in Radiology 2005;  237:395-400. Online at: DietDisorder.[email or web address]://[email or web address].[employer].edu/rad/res/Fleischner-nodule.htm.   Electronically Signed   By: Elberta Fortisaniel  Boyle M.D.   On: 07/14/2014 15:39     EKG Interpretation None      MDM   Final diagnoses:  Right lower quadrant abdominal pain   I personally performed the services described in this documentation, which was scribed in my presence. The recorded information has been reviewed and is accurate. Very small boil, too small to lance at this time, no cellulitis.  Patient presents with gradually worsening right lower quadrant abdominal pain, discussed differential diagnosis including colitis, appendicitis, enteritis, other.  No peritonitis, CT scan results reviewed appendix normal per radiology.  Pain improved in ER, discussed close outpatient follow-up.  Results and differential diagnosis were discussed with the patient/parent/guardian. Close follow up outpatient was discussed, comfortable with the plan.   Medications  sodium chloride 0.9 % bolus 1,000 mL (1,000 mLs Intravenous New Bag/Given 07/14/14 1434)  morphine 4 MG/ML injection 6 mg (6 mg Intravenous Given 07/14/14 1434)  iohexol (OMNIPAQUE) 300 MG/ML solution 100 mL (100 mLs Intravenous Contrast Given  07/14/14 1511)    Filed Vitals:   07/14/14 1305  BP: 140/86  Pulse: 97  Temp: 98.7 F (37.1 C)  TempSrc: Oral  Resp: 18  Height:  (1.702 m)  Weight: 236 lb (107.049 kg)  SpO2: 100%    Final diagnoses:  Right lower quadrant abdominal pain      Blane Ohara, MD 07/14/14 1549

## 2014-07-19 ENCOUNTER — Encounter (HOSPITAL_COMMUNITY): Payer: Self-pay | Admitting: Emergency Medicine

## 2014-07-19 ENCOUNTER — Emergency Department (HOSPITAL_COMMUNITY)
Admission: EM | Admit: 2014-07-19 | Discharge: 2014-07-19 | Disposition: A | Discharge status: Home / Self Care | Payer: Self-pay | Attending: Emergency Medicine | Admitting: Emergency Medicine

## 2014-07-19 DIAGNOSIS — Z720 Tobacco use: Secondary | ICD-10-CM | POA: Insufficient documentation

## 2014-07-19 DIAGNOSIS — K047 Periapical abscess without sinus: Secondary | ICD-10-CM | POA: Insufficient documentation

## 2014-07-19 DIAGNOSIS — Z8739 Personal history of other diseases of the musculoskeletal system and connective tissue: Secondary | ICD-10-CM | POA: Insufficient documentation

## 2014-07-19 MED ORDER — CLINDAMYCIN HCL 150 MG PO CAPS
300.0000 mg | ORAL_CAPSULE | Freq: Once | ORAL | Status: AC
  Administered 2014-07-19: 300 mg via ORAL
  Filled 2014-07-19: qty 2

## 2014-07-19 MED ORDER — IBUPROFEN 400 MG PO TABS
600.0000 mg | ORAL_TABLET | Freq: Once | ORAL | Status: AC
  Administered 2014-07-19: 600 mg via ORAL
  Filled 2014-07-19: qty 2

## 2014-07-19 MED ORDER — IBUPROFEN 600 MG PO TABS: 600.0000 mg | ORAL_TABLET | Freq: Four times a day (QID) | ORAL | Status: DC | PRN

## 2014-07-19 NOTE — Discharge Instructions (Signed)

## 2014-07-19 NOTE — ED Provider Notes (Signed)
CSN: [id number]     Arrival date & time 07/19/14  1010 History  This chart was scribed for Shon Batonourtney F Horton, MD by Tonye RoyaltyJoshua Chen, ED Scribe. This patient was seen in room APA04/APA04 and the patient's care was started at 10:42 AM.    Chief Complaint  Patient presents with  . Wound Infection   The history is provided by the patient. No language interpreter was used.    HPI Comments: Charles [name] is a 24 y.o. male who presents to the Emergency Department for left dental problem, referred here from his dentist. He states he saw his dentist this morning where he had cleaning and x-rays, then it was noted that he might have an abscess or cellulitis in his mouth and states he was referred here for IV antibiotics, though he was also prescribed antibiotics. He states pain is intermittent. He denies fever or difficulty swallowing.  Past Medical History  Diagnosis Date  . Back pain   . Rib pain    Past Surgical History  Procedure Laterality Date  . Tonsillectomy    . Adenoidectomy     Family History  Problem Relation Age of Onset  . Alcohol abuse Father   . Drug abuse Father    History  Substance Use Topics  . Smoking status: Current Every Day Smoker -- 0.25 packs/day for 10 years    Types: Cigarettes  . Smokeless tobacco: Former NeurosurgeonUser    Types: Snuff  . Alcohol Use: No    Review of Systems  Constitutional: Negative for fever.  HENT: Positive for dental problem. Negative for sore throat and trouble swallowing.   All other systems reviewed and are negative.     Allergies  Amoxicillin and Penicillins  Home Medications   Prior to Admission medications   Medication Sig Start Date End Date Taking? Authorizing Provider  ciprofloxacin (CIPRO) 500 MG tablet Take 1 tablet (500 mg total) by mouth 2 (two) times daily. Patient not taking: Reported on 07/14/2014 01/21/13   Ivery QualeHobson Bryant, PA-C  HYDROcodone-acetaminophen (NORCO) 7.5-325 MG per tablet Take 1 tablet by mouth every 4 (four)  hours as needed for pain. Patient not taking: Reported on 07/14/2014 01/21/13   Ivery QualeHobson Bryant, PA-C  ibuprofen (ADVIL,MOTRIN) 600 MG tablet Take 1 tablet (600 mg total) by mouth every 6 (six) hours as needed. 07/19/14   Shon Batonourtney F Horton, MD   BP 133/77 mmHg  Pulse 65  Temp(Src) 98.9 F (37.2 C) (Oral)  Resp 20  Ht 5\' 7"  (1.702 m)  Wt 234 lb 12.8 oz (106.505 kg)  BMI 36.77 kg/m2  SpO2 98% Physical Exam  Constitutional: He is oriented to person, place, and time. He appears well-developed and well-nourished. No distress.  HENT:  Head: Normocephalic and atraumatic.  Tenderness to palpation over tooth 14 at the gumline, no obvious abscess, no facial swelling or tenderness noted, no trismus  Neck: Neck supple.  Cardiovascular: Normal rate and regular rhythm.   Pulmonary/Chest: Effort normal. No respiratory distress.  Neurological: He is alert and oriented to person, place, and time.  Skin: Skin is warm and dry.  Psychiatric: He has a normal mood and affect.  Nursing note and vitals reviewed.   ED Course  Procedures (including critical care time)  DIAGNOSTIC STUDIES: Oxygen Saturation is 98% on room air, normal by my interpretation.    COORDINATION OF CARE: 10:45 AM Discussed treatment plan with patient at beside, the patient agrees with the plan and has no further questions at this time.  Labs Review Labs Reviewed - No data to display  Imaging Review No results found.   EKG Interpretation None      MDM   Final diagnoses:  Dental abscess    Patient presents following a referral from his dentist with concern for dental abscess. On exam there is no drainable abscess but patient does have tenderness to palpation over the gumline and tooth 14. Patient states that he was referred here for IV antibiotics. There are no skin changes over the cheek and no obvious swelling of the face. Discussed with dentist, E.  Demorais, who states that she was concerned that he may need IV  antibiotics. I discussed with her that this is not standard of care for possible dental infections. He has no signs of Ludwigs or deep space infection.  Patient to begin course of clindamycin prescribed by dentist. Given ibuprofen 600 mg every 6 hours for pain.  After history, exam, and medical workup I feel the patient has been appropriately medically screened and is safe for discharge home. Pertinent diagnoses were discussed with the patient. Patient was given return precautions.  I personally performed the services described in this documentation, which was scribed in my presence. The recorded information has been reviewed and is accurate.   Shon Baton, MD 07/19/14 1106

## 2014-07-19 NOTE — ED Notes (Signed)
Pt reports he went to the dentist this am, was sent to ED for eval for possible cellulitis.

## 2015-12-10 ENCOUNTER — Emergency Department (HOSPITAL_COMMUNITY)
Admission: EM | Admit: 2015-12-10 | Discharge: 2015-12-10 | Disposition: A | Discharge status: Home / Self Care | Payer: Medicaid Other | Attending: Dermatology | Admitting: Dermatology

## 2015-12-10 ENCOUNTER — Encounter (HOSPITAL_COMMUNITY): Payer: Self-pay

## 2015-12-10 DIAGNOSIS — F17210 Nicotine dependence, cigarettes, uncomplicated: Secondary | ICD-10-CM | POA: Diagnosis not present

## 2015-12-10 DIAGNOSIS — Y939 Activity, unspecified: Secondary | ICD-10-CM | POA: Diagnosis not present

## 2015-12-10 DIAGNOSIS — Y92009 Unspecified place in unspecified non-institutional (private) residence as the place of occurrence of the external cause: Secondary | ICD-10-CM | POA: Diagnosis not present

## 2015-12-10 DIAGNOSIS — S91312A Laceration without foreign body, left foot, initial encounter: Secondary | ICD-10-CM | POA: Insufficient documentation

## 2015-12-10 DIAGNOSIS — Z5321 Procedure and treatment not carried out due to patient leaving prior to being seen by health care provider: Secondary | ICD-10-CM | POA: Diagnosis not present

## 2015-12-10 DIAGNOSIS — Y999 Unspecified external cause status: Secondary | ICD-10-CM | POA: Diagnosis not present

## 2015-12-10 DIAGNOSIS — W228XXA Striking against or struck by other objects, initial encounter: Secondary | ICD-10-CM | POA: Insufficient documentation

## 2015-12-10 NOTE — ED Notes (Signed)
Pt sts bleeding has stopped and he is heading home. Does not want to be evaluated by provider. Pt. In NAD. Risks and benefits discussed with patient.

## 2015-12-10 NOTE — ED Triage Notes (Addendum)
Per Pt, Pt is coming from house where he stepped on some metal that went through his flip flop and into her left foot. Bleeding is decently controlled. Pt reports some tingling noted in his toes and foot. Pulses noted to be stable, and cap refill to be noted to be <3 Seconds.

## 2015-12-26 ENCOUNTER — Encounter (HOSPITAL_COMMUNITY): Payer: Self-pay | Admitting: Emergency Medicine

## 2015-12-26 ENCOUNTER — Emergency Department (HOSPITAL_COMMUNITY)
Admission: EM | Admit: 2015-12-26 | Discharge: 2015-12-26 | Disposition: A | Discharge status: Home / Self Care | Payer: Medicaid Other | Attending: Emergency Medicine | Admitting: Emergency Medicine

## 2015-12-26 DIAGNOSIS — K047 Periapical abscess without sinus: Secondary | ICD-10-CM | POA: Diagnosis not present

## 2015-12-26 DIAGNOSIS — F17210 Nicotine dependence, cigarettes, uncomplicated: Secondary | ICD-10-CM | POA: Insufficient documentation

## 2015-12-26 DIAGNOSIS — Z791 Long term (current) use of non-steroidal anti-inflammatories (NSAID): Secondary | ICD-10-CM | POA: Diagnosis not present

## 2015-12-26 DIAGNOSIS — K0889 Other specified disorders of teeth and supporting structures: Secondary | ICD-10-CM | POA: Diagnosis present

## 2015-12-26 MED ORDER — IBUPROFEN 600 MG PO TABS: 600.0000 mg | ORAL_TABLET | Freq: Four times a day (QID) | ORAL | 0 refills | Status: DC | PRN

## 2015-12-26 MED ORDER — CLINDAMYCIN HCL 150 MG PO CAPS
150.0000 mg | ORAL_CAPSULE | Freq: Once | ORAL | Status: AC
  Administered 2015-12-26: 150 mg via ORAL
  Filled 2015-12-26: qty 1

## 2015-12-26 MED ORDER — TRAMADOL HCL 50 MG PO TABS
50.0000 mg | ORAL_TABLET | Freq: Once | ORAL | Status: AC
  Administered 2015-12-26: 50 mg via ORAL
  Filled 2015-12-26: qty 1

## 2015-12-26 MED ORDER — CLINDAMYCIN HCL 150 MG PO CAPS: 150.0000 mg | ORAL_CAPSULE | Freq: Four times a day (QID) | ORAL | 0 refills | Status: DC

## 2015-12-26 MED ORDER — TRAMADOL HCL 50 MG PO TABS: 50.0000 mg | ORAL_TABLET | Freq: Four times a day (QID) | ORAL | 0 refills | Status: DC | PRN

## 2015-12-26 MED ORDER — IBUPROFEN 200 MG PO TABS: 200.0000 mg | ORAL_TABLET | Freq: Four times a day (QID) | ORAL | 0 refills | Status: DC | PRN

## 2015-12-26 NOTE — ED Triage Notes (Signed)
PT c/o right lower dental recurrent pain that started this am.

## 2015-12-26 NOTE — Discharge Instructions (Signed)
Complete your entire course of antibiotics as prescribed.  You  may use the tramadol for pain relief but do not drive within 4 hours of taking as this will make you drowsy.  Avoid applying heat or ice to this abscess area which can worsen your symptoms.  You may use warm salt water swish and spit treatment or half peroxide and water swish and spit after meals to keep this area clean as discussed.  Call the dentist listed above for further management of your symptoms. ° ° °

## 2015-12-26 NOTE — ED Provider Notes (Signed)
[facility]-EMERGENCY DEPT Provider Note   CSN: [id number] Arrival date & time: 12/26/15  1553     History   Chief Complaint Chief Complaint  Patient presents with  . Dental Pain    HPI Charles [name] is a 25 y.o. male presenting with a one day history of dental pain and gingival swelling.   The patient has a history of a fractured tooth which broke in April but has not caused him any pain or other problems until today.  There has been no fevers, chills, nausea or vomiting, also no complaint of difficulty swallowing, although chewing makes pain worse.  The patient has tried tylenol without relief of symptoms.      The history is provided by the patient.    Past Medical History:  Diagnosis Date  . Back pain   . Rib pain     There are no active problems to display for this patient.   Past Surgical History:  Procedure Laterality Date  . ADENOIDECTOMY    . TONSILLECTOMY         Home Medications    Prior to Admission medications   Medication Sig Start Date End Date Taking? Authorizing Provider  ibuprofen (ADVIL,MOTRIN) 200 MG tablet Take 200 mg by mouth every 6 (six) hours as needed for mild pain or moderate pain.   Yes Historical Provider, MD    Family History Family History  Problem Relation Age of Onset  . Alcohol abuse Father   . Drug abuse Father     Social History Social History  Substance Use Topics  . Smoking status: Current Every Day Smoker    Packs/day: 0.25    Years: 10.00    Types: Cigarettes  . Smokeless tobacco: Former NeurosurgeonUser    Types: Snuff  . Alcohol use No     Allergies   Amoxicillin and Penicillins   Review of Systems Review of Systems  Constitutional: Negative for fever.  HENT: Positive for dental problem. Negative for facial swelling and sore throat.   Respiratory: Negative for shortness of breath.   Musculoskeletal: Negative for neck pain and neck stiffness.     Physical Exam Updated Vital Signs BP 148/91 (BP Location: Left Arm)    Pulse 72   Temp 98.7 F (37.1 C) (Oral)   Resp 20   Ht 5\' 5"  (1.651 m)   Wt 102.1 kg   SpO2 100%   BMI 37.44 kg/m   Physical Exam  Constitutional: He is oriented to person, place, and time. He appears well-developed and well-nourished. No distress.  HENT:  Head: Normocephalic and atraumatic.  Right Ear: Tympanic membrane and external ear normal.  Left Ear: Tympanic membrane and external ear normal.  Mouth/Throat: Oropharynx is clear and moist and mucous membranes are normal. No oral lesions. No trismus in the jaw. Dental abscesses present. No uvula swelling.    Eyes: Conjunctivae are normal.  Neck: Normal range of motion. Neck supple.  Cardiovascular: Normal rate and normal heart sounds.   Pulmonary/Chest: Effort normal.  Abdominal: He exhibits no distension.  Musculoskeletal: Normal range of motion.  Lymphadenopathy:    He has no cervical adenopathy.  Neurological: He is alert and oriented to person, place, and time.  Skin: Skin is warm and dry. No erythema.  Psychiatric: He has a normal mood and affect.     ED Treatments / Results  Labs (all labs ordered are listed, but only abnormal results are displayed) Labs Reviewed - No data to display  EKG  EKG  Interpretation None       Radiology No results found.  Procedures Procedures (including critical care time)  Medications Ordered in ED Medications - No data to display   Initial Impression / Assessment and Plan / ED Course  I have reviewed the triage vital signs and the nursing notes.  Pertinent labs & imaging results that were available during my care of the patient were reviewed by me and considered in my medical decision making (see chart for details).  Clinical Course    Clindamycin, tramadol, dental referrals given.  No dental absess at this time.   Final Clinical Impressions(s) / ED Diagnoses   Final diagnoses:  None    New Prescriptions New Prescriptions   No medications on file       Burgess Amor, PA-C 12/26/15 1718    Blane Ohara, MD 12/27/15 [phone]

## 2016-04-10 ENCOUNTER — Encounter (HOSPITAL_COMMUNITY): Payer: Self-pay | Admitting: *Deleted

## 2016-04-10 ENCOUNTER — Emergency Department (HOSPITAL_COMMUNITY)
Admission: EM | Admit: 2016-04-10 | Discharge: 2016-04-11 | Disposition: A | Discharge status: Home / Self Care | Payer: Medicaid Other | Attending: Emergency Medicine | Admitting: Emergency Medicine

## 2016-04-10 DIAGNOSIS — Y9389 Activity, other specified: Secondary | ICD-10-CM | POA: Insufficient documentation

## 2016-04-10 DIAGNOSIS — M549 Dorsalgia, unspecified: Secondary | ICD-10-CM | POA: Insufficient documentation

## 2016-04-10 DIAGNOSIS — S20212A Contusion of left front wall of thorax, initial encounter: Secondary | ICD-10-CM

## 2016-04-10 DIAGNOSIS — Z791 Long term (current) use of non-steroidal anti-inflammatories (NSAID): Secondary | ICD-10-CM | POA: Insufficient documentation

## 2016-04-10 DIAGNOSIS — S299XXA Unspecified injury of thorax, initial encounter: Secondary | ICD-10-CM | POA: Diagnosis present

## 2016-04-10 DIAGNOSIS — F17210 Nicotine dependence, cigarettes, uncomplicated: Secondary | ICD-10-CM | POA: Diagnosis not present

## 2016-04-10 DIAGNOSIS — W2107XA Struck by softball, initial encounter: Secondary | ICD-10-CM | POA: Insufficient documentation

## 2016-04-10 DIAGNOSIS — Y929 Unspecified place or not applicable: Secondary | ICD-10-CM | POA: Insufficient documentation

## 2016-04-10 DIAGNOSIS — Y999 Unspecified external cause status: Secondary | ICD-10-CM | POA: Diagnosis not present
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
## 2016-04-11 ENCOUNTER — Emergency Department (HOSPITAL_COMMUNITY): Payer: Medicaid Other

## 2016-04-11 DIAGNOSIS — F17210 Nicotine dependence, cigarettes, uncomplicated: Secondary | ICD-10-CM | POA: Diagnosis not present

## 2016-04-11 DIAGNOSIS — M549 Dorsalgia, unspecified: Secondary | ICD-10-CM | POA: Diagnosis not present

## 2016-04-11 DIAGNOSIS — S299XXA Unspecified injury of thorax, initial encounter: Secondary | ICD-10-CM | POA: Diagnosis not present

## 2016-04-11 DIAGNOSIS — Z791 Long term (current) use of non-steroidal anti-inflammatories (NSAID): Secondary | ICD-10-CM | POA: Diagnosis not present

## 2016-04-11 DIAGNOSIS — S20212A Contusion of left front wall of thorax, initial encounter: Secondary | ICD-10-CM | POA: Diagnosis not present

## 2016-04-11 LAB — URINALYSIS, ROUTINE W REFLEX MICROSCOPIC
Bilirubin Urine: NEGATIVE
Glucose, UA: NEGATIVE mg/dL
Hgb urine dipstick: NEGATIVE
KETONES UR: NEGATIVE mg/dL
LEUKOCYTES UA: NEGATIVE
NITRITE: NEGATIVE
PROTEIN: NEGATIVE mg/dL
Specific Gravity, Urine: 1.019 (ref 1.005–1.030)
pH: 6 (ref 5.0–8.0)

## 2016-04-11 MED ORDER — METHOCARBAMOL 500 MG PO TABS
1000.0000 mg | ORAL_TABLET | Freq: Three times a day (TID) | ORAL | Status: DC
  Administered 2016-04-11: 1000 mg via ORAL
  Filled 2016-04-11: qty 2

## 2016-04-11 MED ORDER — CYCLOBENZAPRINE HCL 5 MG PO TABS: 5.0000 mg | ORAL_TABLET | Freq: Three times a day (TID) | ORAL | 0 refills | Status: DC | PRN

## 2016-04-11 MED ORDER — NAPROXEN 500 MG PO TABS: ORAL_TABLET | ORAL | 0 refills | Status: DC

## 2016-04-11 MED ORDER — KETOROLAC TROMETHAMINE 30 MG/ML IJ SOLN
60.0000 mg | Freq: Once | INTRAMUSCULAR | Status: AC
  Administered 2016-04-11: 60 mg via INTRAMUSCULAR
  Filled 2016-04-11: qty 2

## 2016-04-11 NOTE — Discharge Instructions (Signed)
Use ice packs over the painful area. Take the medications as prescribed. Recheck if you get a fever, cough, get short of breath or the pain seems to be getting worse.

## 2016-06-19 ENCOUNTER — Emergency Department (HOSPITAL_COMMUNITY)
Admission: EM | Admit: 2016-06-19 | Discharge: 2016-06-19 | Disposition: A | Discharge status: Home / Self Care | Payer: Medicaid Other | Attending: Emergency Medicine | Admitting: Emergency Medicine

## 2016-06-19 NOTE — ED Notes (Signed)
No answer in lobby.

## 2016-07-08 ENCOUNTER — Encounter (HOSPITAL_COMMUNITY): Payer: Self-pay | Admitting: Emergency Medicine

## 2016-07-08 ENCOUNTER — Emergency Department (HOSPITAL_COMMUNITY)
Admission: EM | Admit: 2016-07-08 | Discharge: 2016-07-08 | Disposition: A | Discharge status: Home / Self Care | Payer: Medicaid Other | Attending: Emergency Medicine | Admitting: Emergency Medicine

## 2016-07-08 ENCOUNTER — Emergency Department (HOSPITAL_COMMUNITY): Payer: Medicaid Other

## 2016-07-08 DIAGNOSIS — Z791 Long term (current) use of non-steroidal anti-inflammatories (NSAID): Secondary | ICD-10-CM | POA: Diagnosis not present

## 2016-07-08 DIAGNOSIS — Y9389 Activity, other specified: Secondary | ICD-10-CM | POA: Insufficient documentation

## 2016-07-08 DIAGNOSIS — F17210 Nicotine dependence, cigarettes, uncomplicated: Secondary | ICD-10-CM | POA: Insufficient documentation

## 2016-07-08 DIAGNOSIS — S161XXA Strain of muscle, fascia and tendon at neck level, initial encounter: Secondary | ICD-10-CM | POA: Insufficient documentation

## 2016-07-08 DIAGNOSIS — Y929 Unspecified place or not applicable: Secondary | ICD-10-CM | POA: Diagnosis not present

## 2016-07-08 DIAGNOSIS — W1809XA Striking against other object with subsequent fall, initial encounter: Secondary | ICD-10-CM | POA: Diagnosis not present

## 2016-07-08 DIAGNOSIS — S0990XA Unspecified injury of head, initial encounter: Secondary | ICD-10-CM

## 2016-07-08 DIAGNOSIS — Y999 Unspecified external cause status: Secondary | ICD-10-CM | POA: Insufficient documentation

## 2016-07-08 MED ORDER — DIPHENHYDRAMINE HCL 25 MG PO CAPS
25.0000 mg | ORAL_CAPSULE | Freq: Once | ORAL | Status: AC
  Administered 2016-07-08: 25 mg via ORAL
  Filled 2016-07-08: qty 1

## 2016-07-08 MED ORDER — TRAMADOL HCL 50 MG PO TABS
100.0000 mg | ORAL_TABLET | Freq: Once | ORAL | Status: AC
  Administered 2016-07-08: 100 mg via ORAL
  Filled 2016-07-08: qty 2

## 2016-07-08 MED ORDER — IBUPROFEN 600 MG PO TABS: 600.0000 mg | ORAL_TABLET | Freq: Four times a day (QID) | ORAL | 0 refills | Status: DC | PRN

## 2016-07-08 MED ORDER — BUTALBITAL-APAP-CAFFEINE 50-325-40 MG PO TABS: 1.0000 | ORAL_TABLET | Freq: Four times a day (QID) | ORAL | 0 refills | Status: DC | PRN

## 2016-07-08 MED ORDER — PROMETHAZINE HCL 12.5 MG PO TABS
25.0000 mg | ORAL_TABLET | Freq: Once | ORAL | Status: AC
  Administered 2016-07-08: 25 mg via ORAL
  Filled 2016-07-08: qty 2

## 2016-07-08 NOTE — Discharge Instructions (Signed)
Your vital signs have been reviewed. Your oxygen level is 95% on room air. The CT scan of your head is negative for skull fracture, injury to your brain, or other acute problem. The CT scan of your cervical spine is negative for fracture or dislocation. There does appear to be some mild spasming involving your muscles with in the neck or cervical spine. Please monitor your symptoms for change in headache, balance problems, memory problems, dizziness that will not resolve, excessive drowsiness, and difficulty concentrating. These could be signs of a mild concussion. Please discuss these with your Medicaid access physician or your primary care physician. Please return to the emergency department if any emergent changes, problems, or concerns.Use Fioricet and 600mg  of ibuprofen for headache if needed. This medication may cause drowsiness, please do not drive, operate machinery, or participated in activities requiring concentration when taking this medication.
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
## 2016-07-14 ENCOUNTER — Encounter (HOSPITAL_COMMUNITY): Payer: Self-pay | Admitting: Emergency Medicine

## 2016-07-14 ENCOUNTER — Emergency Department (HOSPITAL_COMMUNITY): Payer: Medicaid Other

## 2016-07-14 ENCOUNTER — Emergency Department (HOSPITAL_COMMUNITY)
Admission: EM | Admit: 2016-07-14 | Discharge: 2016-07-14 | Disposition: A | Discharge status: Home / Self Care | Payer: Medicaid Other | Attending: Emergency Medicine | Admitting: Emergency Medicine

## 2016-07-14 DIAGNOSIS — R0789 Other chest pain: Secondary | ICD-10-CM

## 2016-07-14 DIAGNOSIS — Z791 Long term (current) use of non-steroidal anti-inflammatories (NSAID): Secondary | ICD-10-CM | POA: Insufficient documentation

## 2016-07-14 DIAGNOSIS — Z79899 Other long term (current) drug therapy: Secondary | ICD-10-CM | POA: Insufficient documentation

## 2016-07-14 DIAGNOSIS — F17210 Nicotine dependence, cigarettes, uncomplicated: Secondary | ICD-10-CM | POA: Diagnosis not present

## 2016-07-14 DIAGNOSIS — R079 Chest pain, unspecified: Secondary | ICD-10-CM | POA: Diagnosis present

## 2016-07-14 DIAGNOSIS — F430 Acute stress reaction: Secondary | ICD-10-CM

## 2016-07-14 DIAGNOSIS — F439 Reaction to severe stress, unspecified: Secondary | ICD-10-CM | POA: Diagnosis not present

## 2016-07-14 LAB — BASIC METABOLIC PANEL
Anion gap: 6 (ref 5–15)
BUN: 10 mg/dL (ref 6–20)
CHLORIDE: 105 mmol/L (ref 101–111)
CO2: 26 mmol/L (ref 22–32)
Calcium: 9.2 mg/dL (ref 8.9–10.3)
Creatinine, Ser: 0.87 mg/dL (ref 0.61–1.24)
GFR calc non Af Amer: >60 mL/min (ref >60)
Glucose, Bld: 111 mg/dL — ABNORMAL HIGH (ref 65–99)
POTASSIUM: 3.2 mmol/L — AB (ref 3.5–5.1)
Sodium: 137 mmol/L (ref 135–145)

## 2016-07-14 LAB — I-STAT TROPONIN, ED: TROPONIN I, POC: 0 ng/mL (ref 0.00–0.08)

## 2016-07-14 LAB — CBC WITH DIFFERENTIAL/PLATELET
BASOS PCT: 0 %
Basophils Absolute: 0 10*3/uL (ref 0.0–0.1)
EOS ABS: 0.2 10*3/uL (ref 0.0–0.7)
Eosinophils Relative: 3 %
HEMATOCRIT: 47.1 % (ref 39.0–52.0)
HEMOGLOBIN: 16.6 g/dL (ref 13.0–17.0)
LYMPHS PCT: 24 %
Lymphs Abs: 2 10*3/uL (ref 0.7–4.0)
MCH: 30.9 pg (ref 26.0–34.0)
MCHC: 35.2 g/dL (ref 30.0–36.0)
MCV: 87.7 fL (ref 78.0–100.0)
Monocytes Absolute: 0.7 10*3/uL (ref 0.1–1.0)
Monocytes Relative: 9 %
NEUTROS PCT: 64 %
Neutro Abs: 5.4 10*3/uL (ref 1.7–7.7)
Platelets: 200 10*3/uL (ref 150–400)
RBC: 5.37 MIL/uL (ref 4.22–5.81)
RDW: 12.8 % (ref 11.5–15.5)
WBC: 8.4 10*3/uL (ref 4.0–10.5)

## 2016-07-14 MED ORDER — LORAZEPAM 1 MG PO TABS
1.0000 mg | ORAL_TABLET | Freq: Once | ORAL | Status: DC
  Filled 2016-07-14: qty 1

## 2016-07-14 NOTE — ED Notes (Signed)
Went in to give Ativan as ordered.  Pt says he is very drowsy and has gotten that way in last 10-15 minutes.  Dr Joni FearsLui informed, instructed to hold Ativan.  When asked if pt has been drinking or taking any medication that would cause him to be drowsy, pt and visitor says "no".
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
## 2016-07-14 NOTE — ED Provider Notes (Signed)
[facility] DEPT Provider Note   CSN: [id number] Arrival date & time: 07/14/16  1552     History   Chief Complaint Chief Complaint  Patient presents with  . Chest Pain    HPI Charles [name] is a 26 y.o. male.  The history is provided by the patient.  Chest Pain   This is a new problem. The current episode started 1 to 2 hours ago. The problem occurs rarely. The problem has not changed since onset.The pain is associated with an emotional upset. The pain is present in the lateral region. The pain is severe. The quality of the pain is described as sharp. Radiates to: across chest. Exacerbated by: nothing. Associated symptoms include cough, nausea, syncope and vomiting. Pertinent negatives include no leg pain and no lower extremity edema. He has tried nothing for the symptoms. The treatment provided no relief. Risk factors include male gender and smoking/tobacco exposure.  Pertinent negatives for past medical history include no DVT and no PE.  Pertinent negatives for family medical history include: no CAD and no PE.   Presents with chest pain, cough, n/v/d. Cough and n/v/d over past few days w/ subjective fever and chills. Today under significant emotional stress an hour prior to arrival. He states he is married, but got another girl pregnant, and is sleeping with the girl's mother. States chest pain, sharp, shooting, a/w shortness of breath, palpitations. He laid down and thinks he may have blacked out. States he has had many panick attacks in the past that states that this is the same.   Past Medical History:  Diagnosis Date  . Back pain   . Rib pain     There are no active problems to display for this patient.   Past Surgical History:  Procedure Laterality Date  . ADENOIDECTOMY    . TONSILLECTOMY         Home Medications    Prior to Admission medications   Medication Sig Start Date End Date Taking? Authorizing Provider  butalbital-acetaminophen-caffeine (FIORICET,  ESGIC) 50-325-40 MG tablet Take 1-2 tablets by mouth every 6 (six) hours as needed for headache. 07/08/16   Ivery Quale, PA-C  clindamycin (CLEOCIN) 150 MG capsule Take 1 capsule (150 mg total) by mouth every 6 (six) hours. 12/26/15   Burgess Amor, PA-C  cyclobenzaprine (FLEXERIL) 5 MG tablet Take 1 tablet (5 mg total) by mouth 3 (three) times daily as needed. 04/11/16   Devoria Albe, MD  ibuprofen (ADVIL,MOTRIN) 600 MG tablet Take 1 tablet (600 mg total) by mouth every 6 (six) hours as needed. 07/08/16   Ivery Quale, PA-C  naproxen (NAPROSYN) 500 MG tablet Take 1 po BID with food prn pain 04/11/16   Devoria Albe, MD  traMADol (ULTRAM) 50 MG tablet Take 1 tablet (50 mg total) by mouth every 6 (six) hours as needed. 12/26/15   Burgess Amor, PA-C    Family History Family History  Problem Relation Age of Onset  . Alcohol abuse Father   . Drug abuse Father     Social History Social History  Substance Use Topics  . Smoking status: Current Every Day Smoker    Packs/day: 1.00    Years: 10.00    Types: Cigarettes  . Smokeless tobacco: Former Neurosurgeon    Types: Snuff  . Alcohol use Yes     Comment: occ     Allergies   Amoxicillin; Codeine; Penicillins; and Latex   Review of Systems Review of Systems  Constitutional: Positive for fatigue.  HENT: Negative for congestion.   Respiratory: Positive for cough.   Cardiovascular: Positive for chest pain and syncope.  Gastrointestinal: Positive for nausea and vomiting.  Genitourinary: Negative for difficulty urinating.  Allergic/Immunologic: Negative for immunocompromised state.  Neurological: Positive for light-headedness.  Hematological: Does not bruise/bleed easily.  Psychiatric/Behavioral: Negative for confusion.     Physical Exam Updated Vital Signs BP 141/92   Pulse 71   Temp 97.6 F (36.4 C) (Oral)   Resp 25   Ht 5\' 5"  (1.651 m)   Wt 210 lb (95.3 kg)   SpO2 96%   BMI 34.95 kg/m   Physical Exam Physical Exam  Nursing note and vitals  reviewed. Constitutional: Well developed, well nourished, non-toxic, anxious appearing, hyperventilating Head: Normocephalic and atraumatic.  Mouth/Throat: Oropharynx is clear and moist.  Neck: Normal range of motion. Neck supple.  Cardiovascular: Normal rate and regular rhythm.   Pulmonary/Chest: Hyperventilating, breath sounds normal.  Abdominal: Soft. There is no tenderness. There is no rebound and no guarding.  Musculoskeletal: Normal range of motion. no edema or calf tenderness. Neurological: Alert, no facial droop, fluent speech, moves all extremities symmetrically Skin: Skin is warm and dry.  Psychiatric: Cooperative   ED Treatments / Results  Labs (all labs ordered are listed, but only abnormal results are displayed) Labs Reviewed  BASIC METABOLIC PANEL - Abnormal; Notable for the following:       Result Value   Potassium 3.2 (*)    Glucose, Bld 111 (*)    All other components within normal limits  CBC WITH DIFFERENTIAL/PLATELET  Rosezena Sensor, ED    EKG  EKG Interpretation  Date/Time:  Wednesday July 14 2016 15:58:57 EDT Ventricular Rate:  86 PR Interval:    QRS Duration: 86 QT Interval:  357 QTC Calculation: 427 R Axis:   29 Text Interpretation:  normal sinus rhythm  ST elev, probable normal early repol pattern Baseline wander in lead(s) V6 similar to prior EKg  Confirmed by Isabelle Matt MD, Dallen Bunte [phone]) on 07/14/2016 4:10:21 PM       Radiology Dg Chest 2 View  Result Date: 07/14/2016 CLINICAL DATA:  Sharp chest pains and sob today. He said that he has been having some for about 3 days but worse today. Cough and congestion. He said that he has been really stressed and sick. EXAM: CHEST  2 VIEW COMPARISON:  04/11/2016 FINDINGS: The heart size and mediastinal contours are within normal limits. Both lungs are clear. No pleural effusion or pneumothorax. The visualized skeletal structures are unremarkable. IMPRESSION: Normal chest radiographs. Electronically Signed   By:  Amie Portland M.D.   On: 07/14/2016 16:30    Procedures Procedures (including critical care time)  Medications Ordered in ED Medications  LORazepam (ATIVAN) tablet 1 mg (1 mg Oral Not Given 07/14/16 1805)     Initial Impression / Assessment and Plan / ED Course  I have reviewed the triage vital signs and the nursing notes.  Pertinent labs & imaging results that were available during my care of the patient were reviewed by me and considered in my medical decision making (see chart for details).     Presented with atypical chest pain, which is felt to be due to severe stress reaction. He is very anxious and emotional on my evaluation initially. Vital signs are stable. His EKG is nonischemic and without stigmata of arrhythmia. Chest x-ray visualized and shows no acute cardiopulmonary processes. History is concerning for PE, dissection, ACS or other serious intrathoracic or cardiopulmonary processes. He  is low risk for these processes. Patient on reevaluation has been able to calm down, and states that he feels significantly better and now back to baseline. He is requesting discharge home.  Strict return and follow-up instructions reviewed. He expressed understanding of all discharge instructions and felt comfortable with the plan of care.   Final Clinical Impressions(s) / ED Diagnoses   Final diagnoses:  Atypical chest pain  Stress reaction    New Prescriptions New Prescriptions   No medications on file     Lavera Guiseana Duo Deona Novitski, MD 07/14/16 [phone]

## 2016-07-14 NOTE — Discharge Instructions (Signed)
Your work-up today is reassuring. Return for worsening symptoms, including worsening pain, recurrent passing out, difficulty breathing or any other symptoms concerning to you

## 2016-07-14 NOTE — ED Notes (Addendum)
Pt requesting to leave AMA.   Pt says, "I feel a lot better and I want to leave against medical advice."  Dr Joni FearsLui informed. Lui to d/c pt home.

## 2016-07-16 ENCOUNTER — Emergency Department (HOSPITAL_COMMUNITY): Payer: Medicaid Other

## 2016-07-16 ENCOUNTER — Encounter (HOSPITAL_COMMUNITY): Payer: Self-pay

## 2016-07-16 DIAGNOSIS — F17210 Nicotine dependence, cigarettes, uncomplicated: Secondary | ICD-10-CM | POA: Diagnosis not present

## 2016-07-16 DIAGNOSIS — M79641 Pain in right hand: Secondary | ICD-10-CM | POA: Diagnosis not present

## 2016-07-16 DIAGNOSIS — W1839XA Other fall on same level, initial encounter: Secondary | ICD-10-CM | POA: Diagnosis not present

## 2016-07-16 DIAGNOSIS — S60221A Contusion of right hand, initial encounter: Secondary | ICD-10-CM | POA: Diagnosis not present

## 2016-07-16 DIAGNOSIS — Y9372 Activity, wrestling: Secondary | ICD-10-CM | POA: Insufficient documentation

## 2016-07-16 DIAGNOSIS — Y999 Unspecified external cause status: Secondary | ICD-10-CM | POA: Diagnosis not present

## 2016-07-16 DIAGNOSIS — Y929 Unspecified place or not applicable: Secondary | ICD-10-CM | POA: Insufficient documentation

## 2016-07-16 DIAGNOSIS — S6991XA Unspecified injury of right wrist, hand and finger(s), initial encounter: Secondary | ICD-10-CM | POA: Diagnosis present

## 2016-07-16 NOTE — ED Triage Notes (Signed)
I was wrestling with my brother on the porch and fell.  I injured my right hand when trying to catch myself when I was falling.

## 2016-07-17 ENCOUNTER — Emergency Department (HOSPITAL_COMMUNITY)
Admission: EM | Admit: 2016-07-17 | Discharge: 2016-07-17 | Disposition: A | Discharge status: Home / Self Care | Payer: Medicaid Other | Attending: Emergency Medicine | Admitting: Emergency Medicine

## 2016-07-17 DIAGNOSIS — S60221A Contusion of right hand, initial encounter: Secondary | ICD-10-CM

## 2016-07-17 MED ORDER — DICLOFENAC SODIUM 50 MG PO TBEC: 50.0000 mg | DELAYED_RELEASE_TABLET | Freq: Two times a day (BID) | ORAL | 0 refills | Status: DC

## 2016-07-17 NOTE — ED Provider Notes (Signed)
[facility] Provider Note   CSN: [id number] Arrival date & time: 07/16/16  2218     History   Chief Complaint Chief Complaint  Patient presents with  . Hand Injury    HPI Charles [name] is a 26 y.o. male who presents to the ED with right hand pain and is right hand dominant. He reports that he was playing around with his brother and fell off the porch onto his right hand. He complains of pain and swelling to the dorsum of the hand. He denies numbness of tingling, no decreased strength. He denies any other injuries.   HPI  Past Medical History:  Diagnosis Date  . Back pain   . Rib pain     There are no active problems to display for this patient.   Past Surgical History:  Procedure Laterality Date  . ADENOIDECTOMY    . TONSILLECTOMY         Home Medications    Prior to Admission medications   Medication Sig Start Date End Date Taking? Authorizing Provider  butalbital-acetaminophen-caffeine (FIORICET, ESGIC) 50-325-40 MG tablet Take 1-2 tablets by mouth every 6 (six) hours as needed for headache. 07/08/16   Ivery QualeHobson Bryant, PA-C  clindamycin (CLEOCIN) 150 MG capsule Take 1 capsule (150 mg total) by mouth every 6 (six) hours. 12/26/15   Burgess AmorJulie Idol, PA-C  cyclobenzaprine (FLEXERIL) 5 MG tablet Take 1 tablet (5 mg total) by mouth 3 (three) times daily as needed. 04/11/16   Devoria AlbeIva Knapp, MD  diclofenac (VOLTAREN) 50 MG EC tablet Take 1 tablet (50 mg total) by mouth 2 (two) times daily. 07/17/16   Nashley Cordoba Orlene OchM Delynda Sepulveda, NP  ibuprofen (ADVIL,MOTRIN) 600 MG tablet Take 1 tablet (600 mg total) by mouth every 6 (six) hours as needed. 07/08/16   Ivery QualeHobson Bryant, PA-C  naproxen (NAPROSYN) 500 MG tablet Take 1 po BID with food prn pain 04/11/16   Devoria AlbeIva Knapp, MD  traMADol (ULTRAM) 50 MG tablet Take 1 tablet (50 mg total) by mouth every 6 (six) hours as needed. 12/26/15   Burgess AmorJulie Idol, PA-C    Family History Family History  Problem Relation Age of Onset  . Alcohol abuse Father   . Drug abuse  Father     Social History Social History  Substance Use Topics  . Smoking status: Current Every Day Smoker    Packs/day: 1.00    Years: 10.00    Types: Cigarettes  . Smokeless tobacco: Former NeurosurgeonUser    Types: Snuff  . Alcohol use Yes     Comment: occ     Allergies   Amoxicillin; Codeine; Penicillins; and Latex   Review of Systems Review of Systems Negative except as stated in HPI  Physical Exam Updated Vital Signs BP 114/76 (BP Location: Left Arm)   Pulse 84   Temp 98.2 F (36.8 C) (Oral)   Resp 16   Ht 5\' 5"  (1.651 m)   Wt 97.3 kg   SpO2 98%   BMI 35.68 kg/m   Physical Exam  Constitutional: He appears well-developed and well-nourished. No distress.  HENT:  Head: Normocephalic and atraumatic.  Eyes: EOM are normal.  Neck: Neck supple.  Cardiovascular: Normal rate.   Pulmonary/Chest: Effort normal.  Musculoskeletal:       Right hand: He exhibits swelling. He exhibits normal range of motion, normal capillary refill, no deformity and no laceration. Normal sensation noted. Normal strength noted.  Right hand tender on palpation of the dorsum of the right hand.   Neurological:  He is alert.  Skin: Skin is warm and dry.  Psychiatric: He has a normal mood and affect. His behavior is normal.  Nursing note and vitals reviewed.    ED Treatments / Results  Labs (all labs ordered are listed, but only abnormal results are displayed) Labs Reviewed - No data to display   Radiology Dg Hand Complete Right  Result Date: 07/16/2016 CLINICAL DATA:  Right hand pain at the fourth and fifth digits after a fall. Previous history of hand injury. EXAM: RIGHT HAND - COMPLETE 3+ VIEW COMPARISON:  Right wrist 06/23/2007 FINDINGS: There is no evidence of fracture or dislocation. There is no evidence of arthropathy or other focal bone abnormality. Soft tissues are unremarkable. IMPRESSION: Negative. Electronically Signed   By: Burman Nieves M.D.   On: 07/16/2016 23:42     Procedures Procedures (including critical care time)  Medications Ordered in ED Medications - No data to display   Initial Impression / Assessment and Plan / ED Course  I have reviewed the triage vital signs and the nursing notes.  Pertinent imaging results that were available during my care of the patient were reviewed by me and considered in my medical decision making (see chart for details).   Final Clinical Impressions(s) / ED Diagnoses  26 y.o. male with pain to the dorsum of the right hand s/p fall stable for d/c without acute findings on x-ray and no focal neuro deficits. I applied ace wrap to the hand and ice pack.  Rx for NSAIDS. He will f/u with orth if symptoms persist.   Final diagnoses:  Contusion of right hand, initial encounter    New Prescriptions New Prescriptions   DICLOFENAC (VOLTAREN) 50 MG EC TABLET    Take 1 tablet (50 mg total) by mouth 2 (two) times daily.     [address] [place], [place] 07/17/16 4098    Glynn Octave, MD 07/17/16 [phone]

## 2016-07-17 NOTE — ED Notes (Signed)
Patient brought in to triage to be seen by Kerrie BuffaloHope Neese, NP

## 2016-07-17 NOTE — ED Notes (Signed)
Ace wrap and ice pack applied by Kerrie BuffaloHope Neese, NP

## 2016-07-29 ENCOUNTER — Emergency Department (HOSPITAL_COMMUNITY)
Admission: EM | Admit: 2016-07-29 | Discharge: 2016-07-29 | Disposition: A | Discharge status: Home / Self Care | Payer: Medicaid Other | Attending: Emergency Medicine | Admitting: Emergency Medicine

## 2016-07-29 ENCOUNTER — Emergency Department (HOSPITAL_COMMUNITY): Payer: Medicaid Other

## 2016-07-29 ENCOUNTER — Encounter (HOSPITAL_COMMUNITY): Payer: Self-pay | Admitting: Emergency Medicine

## 2016-07-29 DIAGNOSIS — S6991XA Unspecified injury of right wrist, hand and finger(s), initial encounter: Secondary | ICD-10-CM | POA: Diagnosis not present

## 2016-07-29 DIAGNOSIS — Y939 Activity, unspecified: Secondary | ICD-10-CM | POA: Insufficient documentation

## 2016-07-29 DIAGNOSIS — S60221A Contusion of right hand, initial encounter: Secondary | ICD-10-CM | POA: Insufficient documentation

## 2016-07-29 DIAGNOSIS — F17210 Nicotine dependence, cigarettes, uncomplicated: Secondary | ICD-10-CM | POA: Insufficient documentation

## 2016-07-29 DIAGNOSIS — Y929 Unspecified place or not applicable: Secondary | ICD-10-CM | POA: Diagnosis not present

## 2016-07-29 DIAGNOSIS — Y999 Unspecified external cause status: Secondary | ICD-10-CM | POA: Insufficient documentation

## 2016-07-29 DIAGNOSIS — M79631 Pain in right forearm: Secondary | ICD-10-CM | POA: Diagnosis not present

## 2016-07-29 DIAGNOSIS — S59911A Unspecified injury of right forearm, initial encounter: Secondary | ICD-10-CM | POA: Diagnosis not present

## 2016-07-29 DIAGNOSIS — Z791 Long term (current) use of non-steroidal anti-inflammatories (NSAID): Secondary | ICD-10-CM | POA: Insufficient documentation

## 2016-07-29 DIAGNOSIS — Z79899 Other long term (current) drug therapy: Secondary | ICD-10-CM | POA: Insufficient documentation

## 2016-07-29 DIAGNOSIS — M25531 Pain in right wrist: Secondary | ICD-10-CM | POA: Diagnosis not present

## 2016-07-29 DIAGNOSIS — W2201XA Walked into wall, initial encounter: Secondary | ICD-10-CM | POA: Insufficient documentation

## 2016-07-29 NOTE — ED Notes (Signed)
Patient was given a ice pack for swelling to right hand.

## 2016-07-29 NOTE — Discharge Instructions (Signed)
Your Xrays are negative. Use ibuprofen as needed for pain and swelling. Follow up with your doctor. Return to the ED if you develop new or worsening symptoms.
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
## 2016-08-02 ENCOUNTER — Other Ambulatory Visit: Payer: Self-pay | Admitting: *Deleted

## 2016-08-02 NOTE — Patient Outreach (Signed)
[facility]) Care Management  08/02/2016  Charles [name] 1991-04-25 [id number]  Subjective: Telephone call to patient's home number, no answer, message states invalid number, and unable to leave a message. Telephone call to patient's emergency contact Charles [name]) home number, no answer, left HIPAA compliant voicemail message for Charles [name] (patient), and requested call back.   Objective: Per chart review, patient had ED visits on the following dates: 07/29/16 for right hand contusion, 07/17/16 for status post fall with right hand contusion, 07/14/16 for atypical chest pain, 07/08/16 for head injury, and 04/10/16 for rib contusion.      Assessment: Received  [facility] ED Census report referral on 07/30/16.  Referral states patient has had 6 or more ED visits in the past 6 months. ED follow up pending patient contact.   Plan: RNCM will call patient for 2nd telephone outreach attempt, ED utilization  follow up, within 10 business days if no return call.   Charles [name], BSN, CCM [facility] Care Management [facility] Telephonic CM Phone: [phone] Fax: [phone]

## 2016-08-03 ENCOUNTER — Other Ambulatory Visit: Payer: Self-pay | Admitting: *Deleted

## 2016-08-03 ENCOUNTER — Encounter: Payer: Self-pay | Admitting: *Deleted

## 2016-08-03 ENCOUNTER — Other Ambulatory Visit: Payer: Self-pay

## 2016-08-03 NOTE — Patient Outreach (Addendum)
[facility]) Care Management  08/03/2016  Charles [name] May 07, 1990 [id number]  Subjective: Telephone call to patient's home number, no answer, message states invalid number, and unable to leave a message. Telephone call to patient's emergency contact [name]) home number, spoke with patient, and HIPAA verified.  Patient's home number updated in chart.   Discussed [facility] Care Management [facility] ED utilization follow up. Patient states he is doing fine.  States he does not have a primary MD, does not want a primary MD, or need assistance with finding a primary MD.   He does not want any education sent.     States he no longer works of [employer] since January of 2018 and choose not to continue with [employer].   States he now has Medicaid.    Call disconnected. Telephone call to patient's updated home number, left HIPAA compliant voicemail message, and requested call back.    Objective: Per chart review, patient had ED visits on the following dates: 07/29/16 for right hand contusion, 07/17/16 for status post fall with right hand contusion, 07/14/16 for atypical chest pain, 07/08/16 for head injury, and 04/10/16 for rib contusion.      Assessment: Received  [facility] ED Census report referral on 07/30/16.  Referral states patient has had 6 or more ED visits in the past 6 months. ED follow up completed, no care management needs at this time, and will proceed with case closure.     Plan: RNCM will send patient successful outreach letter, [facility] pamphlet, and magnet. RNCM will send case closure due to follow up completed / no care management needs request to Iverson Alamin at [facility] Care Management.   Ariah Mower H. Gardiner Barefoot, BSN, CCM [facility] Care Management [facility] Telephonic CM Phone: [phone] Fax: [phone]

## 2016-08-28 ENCOUNTER — Encounter (HOSPITAL_COMMUNITY): Payer: Self-pay | Admitting: *Deleted

## 2016-08-28 ENCOUNTER — Emergency Department (HOSPITAL_COMMUNITY)
Admission: EM | Admit: 2016-08-28 | Discharge: 2016-08-28 | Disposition: A | Discharge status: Home / Self Care | Payer: Medicaid Other | Attending: Emergency Medicine | Admitting: Emergency Medicine

## 2016-08-28 DIAGNOSIS — L02415 Cutaneous abscess of right lower limb: Secondary | ICD-10-CM | POA: Diagnosis not present

## 2016-08-28 DIAGNOSIS — F17210 Nicotine dependence, cigarettes, uncomplicated: Secondary | ICD-10-CM | POA: Insufficient documentation

## 2016-08-28 DIAGNOSIS — T63301A Toxic effect of unspecified spider venom, accidental (unintentional), initial encounter: Secondary | ICD-10-CM | POA: Diagnosis present

## 2016-08-28 DIAGNOSIS — L0291 Cutaneous abscess, unspecified: Secondary | ICD-10-CM

## 2016-08-28 MED ORDER — SULFAMETHOXAZOLE-TRIMETHOPRIM 800-160 MG PO TABS
1.0000 | ORAL_TABLET | Freq: Once | ORAL | Status: AC
  Administered 2016-08-28: 1 via ORAL
  Filled 2016-08-28: qty 1

## 2016-08-28 MED ORDER — MUPIROCIN CALCIUM 2 % NA OINT: TOPICAL_OINTMENT | NASAL | 0 refills | Status: DC

## 2016-08-28 MED ORDER — LIDOCAINE HCL (PF) 1 % IJ SOLN
INTRAMUSCULAR | Status: AC
  Filled 2016-08-28: qty 5

## 2016-08-28 MED ORDER — SULFAMETHOXAZOLE-TRIMETHOPRIM 800-160 MG PO TABS: 1.0000 | ORAL_TABLET | Freq: Two times a day (BID) | ORAL | 0 refills | Status: AC

## 2016-08-28 NOTE — ED Notes (Signed)
ED Provider at bedside. 

## 2016-08-28 NOTE — ED Triage Notes (Signed)
Pt states he had a bump on the right upper thing 3 days ago tats gotten worse.

## 2016-08-28 NOTE — Discharge Instructions (Signed)
Apply warm wet compresses or soaks to the area.  Avoid squeezing it.  Follow-up with your doctor or return here for any worsening symptoms

## 2016-08-29 NOTE — ED Provider Notes (Signed)
[facility]-EMERGENCY DEPT Provider Note   CSN: [id number] Arrival date & time: 08/28/16  2253     History   Chief Complaint Chief Complaint  Patient presents with  . Abscess    HPI Charles [name] is a 26 y.o. male.  HPI   Charles [name] is a 26 y.o. male who presents to the Emergency Department complaining of "bump" to the right upper thigh for 3 days.  He states that he has been bitten by spiders recently and noticed several small "bumps" to his abdomen that have improved, but admits to squeezing the one to his thigh one day prior and notes some green drainage from it.  He describes pain to the area with palpation and movement of the extremity.  He denies surrounding redness, fever, chills, and groin or abdominal pain.  Past Medical History:  Diagnosis Date  . Back pain   . Rib pain     There are no active problems to display for this patient.   Past Surgical History:  Procedure Laterality Date  . ADENOIDECTOMY    . TONSILLECTOMY         Home Medications    Prior to Admission medications   Medication Sig Start Date End Date Taking? Authorizing Provider  ibuprofen (ADVIL,MOTRIN) 600 MG tablet Take 1 tablet (600 mg total) by mouth every 6 (six) hours as needed. 07/08/16  Yes Ivery Quale, PA-C  butalbital-acetaminophen-caffeine (FIORICET, ESGIC) 50-325-40 MG tablet Take 1-2 tablets by mouth every 6 (six) hours as needed for headache. 07/08/16   Ivery Quale, PA-C  clindamycin (CLEOCIN) 150 MG capsule Take 1 capsule (150 mg total) by mouth every 6 (six) hours. 12/26/15   Burgess Amor, PA-C  cyclobenzaprine (FLEXERIL) 5 MG tablet Take 1 tablet (5 mg total) by mouth 3 (three) times daily as needed. 04/11/16   Devoria Albe, MD  diclofenac (VOLTAREN) 50 MG EC tablet Take 1 tablet (50 mg total) by mouth 2 (two) times daily. 07/17/16   Hope Orlene Och, NP  mupirocin nasal ointment (BACTROBAN) 2 % Apply to the affected areas TID x 10 days 08/28/16   Tremane Spurgeon, PA-C  naproxen (NAPROSYN) 500  MG tablet Take 1 po BID with food prn pain 04/11/16   Devoria Albe, MD  sulfamethoxazole-trimethoprim (BACTRIM DS,SEPTRA DS) 800-160 MG tablet Take 1 tablet by mouth 2 (two) times daily. 08/28/16 09/04/16  Siyana Erney, PA-C  traMADol (ULTRAM) 50 MG tablet Take 1 tablet (50 mg total) by mouth every 6 (six) hours as needed. 12/26/15   Burgess Amor, PA-C    Family History Family History  Problem Relation Age of Onset  . Alcohol abuse Father   . Drug abuse Father     Social History Social History  Substance Use Topics  . Smoking status: Current Every Day Smoker    Packs/day: 1.00    Years: 10.00    Types: Cigarettes  . Smokeless tobacco: Former Neurosurgeon    Types: Snuff  . Alcohol use Yes     Comment: occ     Allergies   Adhesive [tape]; Amoxicillin; Codeine; Penicillins; and Latex   Review of Systems Review of Systems  Constitutional: Negative for chills and fever.  Gastrointestinal: Negative for abdominal pain, nausea and vomiting.  Musculoskeletal: Negative for arthralgias and joint swelling.  Skin: Negative for color change.       Pain and "bump" to right thigh  Neurological: Negative for weakness.  Hematological: Negative for adenopathy.  All other systems reviewed and are negative.  Physical Exam Updated Vital Signs BP 115/65   Pulse 85   Temp 97.9 F (36.6 C) (Oral)   Resp 17   Ht  (1.651 m)   Wt 97.1 kg   SpO2 97%   BMI 35.61 kg/m   Physical Exam  Constitutional: He is oriented to person, place, and time. He appears well-developed and well-nourished. No distress.  HENT:  Head: Normocephalic and atraumatic.  Cardiovascular: Normal rate, regular rhythm and normal heart sounds.   No murmur heard. Pulmonary/Chest: Effort normal and breath sounds normal. No respiratory distress.  Abdominal: Soft. He exhibits no distension. There is no tenderness. There is no guarding.  Musculoskeletal: Normal range of motion.  Neurological: He is alert and oriented to  person, place, and time. He exhibits normal muscle tone. Coordination normal.  Skin: Skin is warm and dry. There is erythema.  1 cm area of induration to the right upper thigh without fluctuance or surrounding erythema.    Nursing note and vitals reviewed.    ED Treatments / Results  Labs (all labs ordered are listed, but only abnormal results are displayed) Labs Reviewed - No data to display  EKG  EKG Interpretation None       Radiology No results found.  Procedures Procedures (including critical care time)  Medications Ordered in ED Medications  sulfamethoxazole-trimethoprim (BACTRIM DS,SEPTRA DS) 800-160 MG per tablet 1 tablet (1 tablet Oral Given 08/28/16 2343)     Initial Impression / Assessment and Plan / ED Course  I have reviewed the triage vital signs and the nursing notes.  Pertinent labs & imaging results that were available during my care of the patient were reviewed by me and considered in my medical decision making (see chart for details).     Pt well appearing.  Small, focal area of induration to the right upper thigh.  No definite abscess.  Area likely indurated secondary to trauma from squeezing.  No drainable abscess.  Pt agrees to warm compresses or soaks, rx written for bactrim and Bactroban.  Return precautions discussed.   Final Clinical Impressions(s) / ED Diagnoses   Final diagnoses:  Abscess    New Prescriptions Discharge Medication List as of 08/28/2016 11:44 PM    START taking these medications   Details  mupirocin nasal ointment (BACTROBAN) 2 % Apply to the affected areas TID x 10 days, Print    sulfamethoxazole-trimethoprim (BACTRIM DS,SEPTRA DS) 800-160 MG tablet Take 1 tablet by mouth 2 (two) times daily., Starting Sat 08/28/2016, Until Sat 09/04/2016, Print         Havannah Streat Between, PA-C 08/29/16 1610    Glynn Octave, MD 08/29/16 [phone]

## 2016-08-31 ENCOUNTER — Encounter: Payer: Self-pay | Admitting: *Deleted

## 2016-08-31 ENCOUNTER — Other Ambulatory Visit: Payer: Self-pay | Admitting: *Deleted

## 2016-08-31 NOTE — Patient Outreach (Addendum)
[facility]) Care Management  08/31/2016  Charles [name] 11-29-1990 [id number]  Subjective: Telephone call to patient's home number, spoke with patient, and HIPAA verified.  Discussed [facility] Care Management [facility] ED utilization follow up, patient voiced understanding, and is in agreement to follow up.   Patient states he is feeling better, no longer has [employer], has Medicaid as [employer], does not have a primary MD, and is not interested in obtaining primary.    Discussed [facility] may be an option for primary care services, patient voiced understanding of education, and in is agreement that he is aware of the facilities location. Patient states he does not have any transition of care, care coordination, disease management, disease monitoring, transportation, community resource, or pharmacy needs at this time.  States he is appreciative of the follow up and is in agreement to receive [facility] Care Management information.  Telephone call to Tomasita Crumble at [facility] Care Management, advised patient no longer has [employer].    Objective: Per chart review, patient had ED visits on the following dates:08/28/16 for abscess, 07/29/16 for right hand contusion, 07/17/16 for status post fall with right hand contusion, 07/14/16 for atypical chest pain, 07/08/16 for head injury, and 04/10/16 for rib contusion.  [facility] Care Management completed ED follow up call on 08/03/16.    Assessment: Received [facility] ED Census report referral on 08/31/16. Referral states patient has had 6 or more ED visits in the past 6 months. ED follow up completed, no care management needs at this time, and will proceed with case closure.     Plan: RNCM will send patient successful outreach letter, [facility] pamphlet, and magnet. RNCM will send case closure due to follow up completed / no care management needs request to Iverson Alamin at [facility] Care Management.   Charlye Spare H. Gardiner Barefoot, BSN,  CCM [facility] Care Management [facility] Telephonic CM Phone: [phone] Fax: [phone]

## 2016-09-12 ENCOUNTER — Emergency Department (HOSPITAL_COMMUNITY): Payer: Medicaid Other

## 2016-09-12 ENCOUNTER — Emergency Department (HOSPITAL_COMMUNITY)
Admission: EM | Admit: 2016-09-12 | Discharge: 2016-09-12 | Disposition: A | Discharge status: Home / Self Care | Payer: Medicaid Other | Attending: Emergency Medicine | Admitting: Emergency Medicine

## 2016-09-12 ENCOUNTER — Encounter (HOSPITAL_COMMUNITY): Payer: Self-pay | Admitting: *Deleted

## 2016-09-12 DIAGNOSIS — M25461 Effusion, right knee: Secondary | ICD-10-CM | POA: Insufficient documentation

## 2016-09-12 DIAGNOSIS — M25561 Pain in right knee: Secondary | ICD-10-CM

## 2016-09-12 DIAGNOSIS — F17210 Nicotine dependence, cigarettes, uncomplicated: Secondary | ICD-10-CM | POA: Insufficient documentation

## 2016-09-12 MED ORDER — IBUPROFEN 800 MG PO TABS
800.0000 mg | ORAL_TABLET | Freq: Once | ORAL | Status: AC
  Administered 2016-09-12: 800 mg via ORAL
  Filled 2016-09-12: qty 1

## 2016-09-12 MED ORDER — NAPROXEN 500 MG PO TABS: 500.0000 mg | ORAL_TABLET | Freq: Two times a day (BID) | ORAL | 0 refills | Status: DC

## 2016-09-12 NOTE — Discharge Instructions (Signed)
Wear your knee brace, use the crutches as needed. Apply ice several times a day.

## 2016-09-12 NOTE — ED Notes (Signed)
Pt has Crutches at home

## 2016-09-12 NOTE — ED Triage Notes (Signed)
Pt states he went to get up & right knee gave way. Says he felt a pop.

## 2016-09-12 NOTE — ED Provider Notes (Signed)
[facility]-EM[facility]Y DEPT Provider Note   CSN: [id number] Arrival date & time: 09/12/16  0005  By signing my name below, I, Modena Jansky, attest that this documentation has been prepared under the direction and in the presence of Dione Booze, MD. Electronically Signed: Modena Jansky, Scribe. 09/12/2016. 12:57 AM.  History   Chief Complaint Chief Complaint  Patient presents with  . Knee Pain   The history is provided by the patient. No language interpreter was used.    HPI Comments: Charles [name] is a 26 y.o. male who presents to the Emergency Department complaining of constant moderate right knee pain that started about 1.5 hours ago. He states his right knee gave out and made a pop sound before pain onset. No treatment PTA. His pain is exacerbated by RUE movement. He reports associated swelling. He admits to a prior hx of right knee injury from highschool football. Denies any other complaints at this time.    [facility]: [facility]  Past Medical History:  Diagnosis Date  . Back pain   . Rib pain     There are no active problems to display for this patient.   Past Surgical History:  Procedure Laterality Date  . ADENOIDECTOMY    . TONSILLECTOMY         Home Medications    Prior to Admission medications   Medication Sig Start Date End Date Taking? Authorizing Provider  butalbital-acetaminophen-caffeine (FIORICET, ESGIC) 50-325-40 MG tablet Take 1-2 tablets by mouth every 6 (six) hours as needed for headache. 07/08/16  Yes Ivery Quale, PA-C  clindamycin (CLEOCIN) 150 MG capsule Take 1 capsule (150 mg total) by mouth every 6 (six) hours. 12/26/15  Yes Idol, Raynelle Fanning, PA-C  cyclobenzaprine (FLEXERIL) 5 MG tablet Take 1 tablet (5 mg total) by mouth 3 (three) times daily as needed. 04/11/16  Yes Devoria Albe, MD  diclofenac (VOLTAREN) 50 MG EC tablet Take 1 tablet (50 mg total) by mouth 2 (two) times daily. 07/17/16  Yes Neese, Hope M, NP  ibuprofen (ADVIL,MOTRIN) 600 MG  tablet Take 1 tablet (600 mg total) by mouth every 6 (six) hours as needed. 07/08/16  Yes Ivery Quale, PA-C  mupirocin nasal ointment (BACTROBAN) 2 % Apply to the affected areas TID x 10 days 08/28/16  Yes Triplett, Tammy, PA-C  naproxen (NAPROSYN) 500 MG tablet Take 1 po BID with food prn pain 04/11/16  Yes Devoria Albe, MD  traMADol (ULTRAM) 50 MG tablet Take 1 tablet (50 mg total) by mouth every 6 (six) hours as needed. 12/26/15  Yes Burgess Amor, PA-C    Family History Family History  Problem Relation Age of Onset  . Alcohol abuse Father   . Drug abuse Father     Social History Social History  Substance Use Topics  . Smoking status: Current Every Day Smoker    Packs/day: 1.00    Years: 10.00    Types: Cigarettes  . Smokeless tobacco: Former Neurosurgeon    Types: Snuff  . Alcohol use Yes     Comment: occ     Allergies   Adhesive [tape]; Amoxicillin; Codeine; Penicillins; and Latex   Review of Systems Review of Systems  Constitutional: Negative for fever.  Musculoskeletal: Positive for arthralgias (Right knee), joint swelling (Right knee) and myalgias (Right knee).  All other systems reviewed and are negative.    Physical Exam Updated Vital Signs BP (!) 142/88 (BP Location: Right Arm)   Pulse (!) 102   Temp 98.1 F (36.7 C) (Oral)  Resp 20   Ht 5\' 5"  (1.651 m)   Wt 214 lb (97.1 kg)   SpO2 98%   BMI 35.61 kg/m   Physical Exam  Constitutional: He is oriented to person, place, and time. He appears well-developed and well-nourished.  HENT:  Head: Normocephalic and atraumatic.  Eyes: EOM are normal. Pupils are equal, round, and reactive to light.  Neck: Normal range of motion. Neck supple. No JVD present.  Cardiovascular: Normal rate, regular rhythm and normal heart sounds.   No murmur heard. Pulmonary/Chest: Effort normal and breath sounds normal. He has no wheezes. He has no rales. He exhibits no tenderness.  Abdominal: Soft. Bowel sounds are normal. He exhibits no  distension and no mass. There is no tenderness.  Musculoskeletal: Normal range of motion. He exhibits no edema.  Small to moderate effusion to the right knee. TTP medially and laterally. No instability. Lockman's and McMurray's tests are negative.   Lymphadenopathy:    He has no cervical adenopathy.  Neurological: He is alert and oriented to person, place, and time. No cranial nerve deficit. He exhibits normal muscle tone. Coordination normal.  Skin: Skin is warm and dry. No rash noted.  Psychiatric: He has a normal mood and affect. His behavior is normal. Judgment and thought content normal.  Nursing note and vitals reviewed.    ED Treatments / Results  DIAGNOSTIC STUDIES: Oxygen Saturation is 98% on RA, normal by my interpretation.    COORDINATION OF CARE: 1:03 AM- Pt advised of plan for treatment and pt agrees.  Radiology Dg Knee Complete 4 Views Right  Result Date: 09/12/2016 CLINICAL DATA:  Right knee gave way and patient felt a pop. EXAM: RIGHT KNEE - COMPLETE 4+ VIEW COMPARISON:  None. FINDINGS: No evidence of fracture, dislocation, or joint effusion. No evidence of arthropathy or other focal bone abnormality. Soft tissues are unremarkable. IMPRESSION: Negative. Electronically Signed   By: Burman NievesWilliam  Stevens M.D.   On: 09/12/2016 01:17    Procedures Procedures (including critical care time)  Medications Ordered in ED Medications  ibuprofen (ADVIL,MOTRIN) tablet 800 mg (not administered)     Initial Impression / Assessment and Plan / ED Course  I have reviewed the triage vital signs and the nursing notes.  Pertinent imaging results that were available during my care of the patient were reviewed by me and considered in my medical decision making (see chart for details).  Right knee pain with knee spontaneously giving way. This is suspicious for meniscus tear. No evidence of instability noted on exam. X-ray show no acute injury. He is placed in a knee immobilizer and given  crutches and referred to orthopedics for follow-up.  Final Clinical Impressions(s) / ED Diagnoses   Final diagnoses:  Acute pain of right knee    New Prescriptions New Prescriptions   NAPROXEN (NAPROSYN) 500 MG TABLET    Take 1 tablet (500 mg total) by mouth 2 (two) times daily.   I personally performed the services described in this documentation, which was scribed in my presence. The recorded information has been reviewed and is accurate.       Dione BoozeGlick, Bobetta Korf, MD 09/12/16 [phone]

## 2016-10-03 ENCOUNTER — Emergency Department (HOSPITAL_COMMUNITY): Payer: Self-pay

## 2016-10-03 ENCOUNTER — Emergency Department (HOSPITAL_COMMUNITY)
Admission: EM | Admit: 2016-10-03 | Discharge: 2016-10-03 | Disposition: A | Discharge status: Home / Self Care | Payer: Self-pay | Attending: Emergency Medicine | Admitting: Emergency Medicine

## 2016-10-03 ENCOUNTER — Encounter (HOSPITAL_COMMUNITY): Payer: Self-pay | Admitting: Emergency Medicine

## 2016-10-03 DIAGNOSIS — N200 Calculus of kidney: Secondary | ICD-10-CM

## 2016-10-03 DIAGNOSIS — N201 Calculus of ureter: Secondary | ICD-10-CM | POA: Insufficient documentation

## 2016-10-03 DIAGNOSIS — Z91040 Latex allergy status: Secondary | ICD-10-CM | POA: Insufficient documentation

## 2016-10-03 DIAGNOSIS — Z7982 Long term (current) use of aspirin: Secondary | ICD-10-CM | POA: Insufficient documentation

## 2016-10-03 DIAGNOSIS — F17210 Nicotine dependence, cigarettes, uncomplicated: Secondary | ICD-10-CM | POA: Insufficient documentation

## 2016-10-03 LAB — COMPREHENSIVE METABOLIC PANEL
ALT: 12 U/L — ABNORMAL LOW (ref 17–63)
ANION GAP: 5 (ref 5–15)
AST: 18 U/L (ref 15–41)
Albumin: 4 g/dL (ref 3.5–5.0)
Alkaline Phosphatase: 76 U/L (ref 38–126)
BUN: 11 mg/dL (ref 6–20)
CHLORIDE: 106 mmol/L (ref 101–111)
CO2: 28 mmol/L (ref 22–32)
Calcium: 9.1 mg/dL (ref 8.9–10.3)
Creatinine, Ser: 0.94 mg/dL (ref 0.61–1.24)
Glucose, Bld: 103 mg/dL — ABNORMAL HIGH (ref 65–99)
POTASSIUM: 3.7 mmol/L (ref 3.5–5.1)
SODIUM: 139 mmol/L (ref 135–145)
Total Bilirubin: 0.5 mg/dL (ref 0.3–1.2)
Total Protein: 6.4 g/dL — ABNORMAL LOW (ref 6.5–8.1)

## 2016-10-03 LAB — URINALYSIS, ROUTINE W REFLEX MICROSCOPIC
BACTERIA UA: NONE SEEN
GLUCOSE, UA: NEGATIVE mg/dL
HGB URINE DIPSTICK: NEGATIVE
KETONES UR: NEGATIVE mg/dL
NITRITE: NEGATIVE
Protein, ur: 30 mg/dL — AB
Specific Gravity, Urine: 1.033 — ABNORMAL HIGH (ref 1.005–1.030)
pH: 5 (ref 5.0–8.0)

## 2016-10-03 LAB — CBC
HEMATOCRIT: 42.8 % (ref 39.0–52.0)
HEMOGLOBIN: 15 g/dL (ref 13.0–17.0)
MCH: 30.6 pg (ref 26.0–34.0)
MCHC: 35 g/dL (ref 30.0–36.0)
MCV: 87.3 fL (ref 78.0–100.0)
Platelets: 168 10*3/uL (ref 150–400)
RBC: 4.9 MIL/uL (ref 4.22–5.81)
RDW: 12.7 % (ref 11.5–15.5)
WBC: 9.8 10*3/uL (ref 4.0–10.5)

## 2016-10-03 MED ORDER — PROMETHAZINE HCL 25 MG PO TABS: 25.0000 mg | ORAL_TABLET | Freq: Four times a day (QID) | ORAL | 0 refills | Status: DC | PRN

## 2016-10-03 MED ORDER — OXYCODONE-ACETAMINOPHEN 5-325 MG PO TABS: 1.0000 | ORAL_TABLET | ORAL | 0 refills | Status: DC | PRN

## 2016-10-03 MED ORDER — IBUPROFEN 800 MG PO TABS: 800.0000 mg | ORAL_TABLET | Freq: Three times a day (TID) | ORAL | 0 refills | Status: DC

## 2016-10-03 MED ORDER — KETOROLAC TROMETHAMINE 30 MG/ML IJ SOLN
30.0000 mg | Freq: Once | INTRAMUSCULAR | Status: AC
  Administered 2016-10-03: 30 mg via INTRAVENOUS
  Filled 2016-10-03: qty 1

## 2016-10-03 MED ORDER — TAMSULOSIN HCL 0.4 MG PO CAPS: 0.4000 mg | ORAL_CAPSULE | Freq: Two times a day (BID) | ORAL | 0 refills | Status: DC

## 2016-10-03 NOTE — ED Triage Notes (Signed)
Pt states he got a stabbing pain to RLQ around 1530 today. States it "doubled him over" and then "eased up" and it started back about 30 mins ago and he went to use the bathroom and "started peeing blood".

## 2016-10-03 NOTE — ED Provider Notes (Signed)
[facility]-EM[facility]Y DEPT Provider Note   CSN: [id number] Arrival date & time: 10/03/16  2018     History   Chief Complaint Chief Complaint  Patient presents with  . Abdominal Pain    HPI Charles [name] is a 26 y.o. male.  HPI  The patient is a 26 year old male who presents with a complaint of right lower quadrant pain which started at 3:00, it has been intermittent, spontaneously improved and then came back later this afternoon. There is no radiation of this pain to the scrotum or the testicles, there is no radiation of the back. Nothing seems to make it better or worse, he has no appetite but is not vomiting. No fevers or chills, no sweating. He did have some visible gross hematuria today. He does think that he had a kidney stone in the past that he passed spontaneously without any evaluation.  Past Medical History:  Diagnosis Date  . Back pain   . Rib pain     There are no active problems to display for this patient.   Past Surgical History:  Procedure Laterality Date  . ADENOIDECTOMY    . TONSILLECTOMY         Home Medications    Prior to Admission medications   Medication Sig Start Date End Date Taking? Authorizing Provider  acetaminophen (TYLENOL) 500 MG tablet Take 1,000 mg by mouth every 6 (six) hours as needed for mild pain, moderate pain, fever or headache.   Yes [provider]  ibuprofen (ADVIL,MOTRIN) 800 MG tablet Take 1 tablet (800 mg total) by mouth 3 (three) times daily. 10/03/16   Eber Hong, MD  oxyCODONE-acetaminophen (PERCOCET) 5-325 MG tablet Take 1 tablet by mouth every 4 (four) hours as needed. 10/03/16   Eber Hong, MD  promethazine (PHENERGAN) 25 MG tablet Take 1 tablet (25 mg total) by mouth every 6 (six) hours as needed for nausea or vomiting. 10/03/16   Eber Hong, MD  tamsulosin (FLOMAX) 0.4 MG CAPS capsule Take 1 capsule (0.4 mg total) by mouth 2 (two) times daily. 10/03/16   Eber Hong, MD    Family History Family History    Problem Relation Age of Onset  . Alcohol abuse Father   . Drug abuse Father     Social History Social History  Substance Use Topics  . Smoking status: Current Every Day Smoker    Packs/day: 1.00    Years: 10.00    Types: Cigarettes  . Smokeless tobacco: Former Neurosurgeon    Types: Snuff  . Alcohol use Yes     Comment: occ     Allergies   Adhesive [tape]; Amoxicillin; Codeine; Penicillins; and Latex   Review of Systems Review of Systems  All other systems reviewed and are negative.    Physical Exam Updated Vital Signs BP 120/71 (BP Location: Left Arm)   Pulse 89   Temp 98.3 F (36.8 C) (Oral)   Resp 18   Ht 5\' 5"  (1.651 m)   Wt 95.4 kg (210 lb 4 oz)   SpO2 97%   BMI 34.99 kg/m   Physical Exam  Constitutional: He appears well-developed and well-nourished. No distress.  HENT:  Head: Normocephalic and atraumatic.  Mouth/Throat: Oropharynx is clear and moist. No oropharyngeal exudate.  Eyes: Conjunctivae and EOM are normal. Pupils are equal, round, and reactive to light. Right eye exhibits no discharge. Left eye exhibits no discharge. No scleral icterus.  Neck: Normal range of motion. Neck supple. No JVD present. No thyromegaly present.  Cardiovascular: Normal rate, regular rhythm, normal heart sounds and intact distal pulses.  Exam reveals no gallop and no friction rub.   No murmur heard. Pulmonary/Chest: Effort normal and breath sounds normal. No respiratory distress. He has no wheezes. He has no rales.  Abdominal: Soft. Bowel sounds are normal. He exhibits no distension and no mass. There is no tenderness.  + mild R CVA ttp  Musculoskeletal: Normal range of motion. He exhibits no edema or tenderness.  Lymphadenopathy:    He has no cervical adenopathy.  Neurological: He is alert. Coordination normal.  Skin: Skin is warm and dry. No rash noted. No erythema.  Psychiatric: He has a normal mood and affect. His behavior is normal.  Nursing note and vitals  reviewed.    ED Treatments / Results  Labs (all labs ordered are listed, but only abnormal results are displayed) Labs Reviewed  URINALYSIS, ROUTINE W REFLEX MICROSCOPIC - Abnormal; Notable for the following:       Result Value   Color, Urine AMBER (*)    APPearance HAZY (*)    Specific Gravity, Urine 1.033 (*)    Bilirubin Urine SMALL (*)    Protein, ur 30 (*)    Leukocytes, UA TRACE (*)    Squamous Epithelial / LPF 0-5 (*)    All other components within normal limits  COMPREHENSIVE METABOLIC PANEL - Abnormal; Notable for the following:    Glucose, Bld 103 (*)    Total Protein 6.4 (*)    ALT 12 (*)    All other components within normal limits  CBC     Radiology Ct Renal Stone Study  Result Date: 10/03/2016 CLINICAL DATA:  Patient with right flank pain and hematuria. EXAM: CT ABDOMEN AND PELVIS WITHOUT CONTRAST TECHNIQUE: Multidetector CT imaging of the abdomen and pelvis was performed following the standard protocol without IV contrast. COMPARISON:  CT abdomen pelvis 07/14/2014 FINDINGS: Lower chest: Normal heart size. Stable 5 mm right lower lobe pulmonary nodule and adjacent 4 mm right lower lobe pulmonary nodule (image 6 and image 9 ; series 4). Given stability over time these are favored to be benign etiology. Left basilar atelectasis atelectasis within the left lower lobe. No pleural effusion. Hepatobiliary: The liver is normal in size and contour. Gallbladder is unremarkable. Pancreas: Unremarkable Spleen: Unremarkable Adrenals/Urinary Tract: Adrenal glands are normal. Mild right pelviectasis. 4 mm stone within the proximal right ureter (image 54; series 2). Urinary bladder is unremarkable. Additional punctate 1- 2 mm stone inferior pole right kidney (image 58; series 5). Stomach/Bowel: No abnormal bowel wall thickening or evidence for bowel obstruction. No free fluid or free intraperitoneal air. Normal appendix. Normal morphology of the stomach. Vascular/Lymphatic: Normal caliber  abdominal aorta. No retroperitoneal lymphadenopathy. Reproductive: Prostate is unremarkable. Other: None. Musculoskeletal: No aggressive or acute appearing osseous lesions. IMPRESSION: There is a 4 mm stone within the proximal right ureter resulting in mild right pelviectasis. Electronically Signed   By: Annia Beltrew  Davis M.D.   On: 10/03/2016 22:01    Procedures Procedures (including critical care time)  Medications Ordered in ED Medications  ketorolac (TORADOL) 30 MG/ML injection 30 mg (30 mg Intravenous Given 10/03/16 2129)     Initial Impression / Assessment and Plan / ED Course  I have reviewed the triage vital signs and the nursing notes.  Pertinent labs & imaging results that were available during my care of the patient were reviewed by me and considered in my medical decision making (see chart for details).  The patient has no reproducible tenderness making appendicitis much less likely. He does have some CVA tenderness making a kidney stone more likely as well as the hematuria which also lends credence to this thought. Toradol given, CT renal stone protocol ordered.  CT shows 4mm prox stone UA with hematuria - no infection CBC and CMP without sig findings Informed of findings  Stable for d/c.  Final Clinical Impressions(s) / ED Diagnoses   Final diagnoses:  Kidney stone on right side    New Prescriptions New Prescriptions   IBUPROFEN (ADVIL,MOTRIN) 800 MG TABLET    Take 1 tablet (800 mg total) by mouth 3 (three) times daily.   OXYCODONE-ACETAMINOPHEN (PERCOCET) 5-325 MG TABLET    Take 1 tablet by mouth every 4 (four) hours as needed.   PROMETHAZINE (PHENERGAN) 25 MG TABLET    Take 1 tablet (25 mg total) by mouth every 6 (six) hours as needed for nausea or vomiting.   TAMSULOSIN (FLOMAX) 0.4 MG CAPS CAPSULE    Take 1 capsule (0.4 mg total) by mouth 2 (two) times daily.     Eber Hong, MD 10/03/16 [phone]

## 2016-10-03 NOTE — Discharge Instructions (Signed)

## 2016-10-04 ENCOUNTER — Emergency Department (HOSPITAL_COMMUNITY)
Admission: EM | Admit: 2016-10-04 | Discharge: 2016-10-04 | Disposition: A | Discharge status: Home / Self Care | Payer: Self-pay | Attending: Emergency Medicine | Admitting: Emergency Medicine

## 2016-10-04 DIAGNOSIS — N23 Unspecified renal colic: Secondary | ICD-10-CM | POA: Insufficient documentation

## 2016-10-04 DIAGNOSIS — F17210 Nicotine dependence, cigarettes, uncomplicated: Secondary | ICD-10-CM | POA: Insufficient documentation

## 2016-10-04 MED ORDER — KETOROLAC TROMETHAMINE 60 MG/2ML IM SOLN
60.0000 mg | Freq: Once | INTRAMUSCULAR | Status: AC
  Administered 2016-10-04: 60 mg via INTRAMUSCULAR
  Filled 2016-10-04: qty 2

## 2016-10-04 MED ORDER — ONDANSETRON 8 MG PO TBDP
8.0000 mg | ORAL_TABLET | Freq: Once | ORAL | Status: AC
  Administered 2016-10-04: 8 mg via ORAL
  Filled 2016-10-04: qty 1

## 2016-10-04 MED ORDER — OXYCODONE-ACETAMINOPHEN 5-325 MG PO TABS: 1.0000 | ORAL_TABLET | ORAL | 0 refills | Status: DC | PRN

## 2016-10-04 MED ORDER — HYDROMORPHONE HCL 1 MG/ML IJ SOLN
1.0000 mg | Freq: Once | INTRAMUSCULAR | Status: AC
  Administered 2016-10-04: 1 mg via INTRAMUSCULAR
  Filled 2016-10-04: qty 1

## 2016-10-04 MED FILL — Oxycodone w/ Acetaminophen Tab 5-325 MG: ORAL | Qty: 6 | Status: AC

## 2016-10-04 NOTE — ED Notes (Signed)
Patient was given a prepackage of Percocet quantity six and instructions on use.

## 2016-10-04 NOTE — ED Triage Notes (Signed)
Pt c/o right side abdominal pain; pt states he was diagnosed with a kidney stone yesterday and the pain is not getting any better

## 2016-10-04 NOTE — ED Provider Notes (Signed)
[facility] DEPT Provider Note   CSN: [id number] Arrival date & time: 10/04/16  4098     History   Chief Complaint Chief Complaint  Patient presents with  . Abdominal Pain    HPI Charles [name] is a 26 y.o. male.  Patient presents to the emergency department for evaluation of right flank pain. Patient was seen earlier tonight and diagnosed with a kidney stone. He achieved pain relief with medication in the ER, but since going home the pain is returning now worsened. He reports dry heaves, constant, severe pain in the right lower abdomen and back.      Past Medical History:  Diagnosis Date  . Back pain   . Rib pain     There are no active problems to display for this patient.   Past Surgical History:  Procedure Laterality Date  . ADENOIDECTOMY    . TONSILLECTOMY         Home Medications    Prior to Admission medications   Medication Sig Start Date End Date Taking? Authorizing Provider  acetaminophen (TYLENOL) 500 MG tablet Take 1,000 mg by mouth every 6 (six) hours as needed for mild pain, moderate pain, fever or headache.    [provider]  ibuprofen (ADVIL,MOTRIN) 800 MG tablet Take 1 tablet (800 mg total) by mouth 3 (three) times daily. 10/03/16   Eber Hong, MD  oxyCODONE-acetaminophen (PERCOCET) 5-325 MG tablet Take 1 tablet by mouth every 4 (four) hours as needed. 10/03/16   Eber Hong, MD  promethazine (PHENERGAN) 25 MG tablet Take 1 tablet (25 mg total) by mouth every 6 (six) hours as needed for nausea or vomiting. 10/03/16   Eber Hong, MD  tamsulosin (FLOMAX) 0.4 MG CAPS capsule Take 1 capsule (0.4 mg total) by mouth 2 (two) times daily. 10/03/16   Eber Hong, MD    Family History Family History  Problem Relation Age of Onset  . Alcohol abuse Father   . Drug abuse Father     Social History Social History  Substance Use Topics  . Smoking status: Current Every Day Smoker    Packs/day: 1.00    Years: 10.00    Types: Cigarettes  .  Smokeless tobacco: Former Neurosurgeon    Types: Snuff  . Alcohol use Yes     Comment: occ     Allergies   Adhesive [tape]; Amoxicillin; Codeine; Penicillins; and Latex   Review of Systems Review of Systems  Gastrointestinal: Positive for nausea and vomiting.  Genitourinary: Positive for flank pain.  All other systems reviewed and are negative.    Physical Exam Updated Vital Signs BP 128/89 (BP Location: Left Arm)   Pulse 76   Temp 97.5 F (36.4 C) (Oral)   Resp 20   Ht 5\' 5"  (1.651 m)   Wt 95.3 kg (210 lb)   SpO2 96%   BMI 34.95 kg/m   Physical Exam  Constitutional: He is oriented to person, place, and time. He appears well-developed and well-nourished. He appears distressed.  HENT:  Head: Normocephalic and atraumatic.  Right Ear: Hearing normal.  Left Ear: Hearing normal.  Nose: Nose normal.  Mouth/Throat: Oropharynx is clear and moist and mucous membranes are normal.  Eyes: Conjunctivae and EOM are normal. Pupils are equal, round, and reactive to light.  Neck: Normal range of motion. Neck supple.  Cardiovascular: Regular rhythm, S1 normal and S2 normal.  Exam reveals no gallop and no friction rub.   No murmur heard. Pulmonary/Chest: Effort normal and breath sounds  normal. No respiratory distress. He exhibits no tenderness.  Abdominal: Soft. Normal appearance and bowel sounds are normal. There is no hepatosplenomegaly. There is no tenderness. There is no rebound, no guarding, no tenderness at McBurney's point and negative Murphy's sign. No hernia.  Musculoskeletal: Normal range of motion.  Neurological: He is alert and oriented to person, place, and time. He has normal strength. No cranial nerve deficit or sensory deficit. Coordination normal. GCS eye subscore is 4. GCS verbal subscore is 5. GCS motor subscore is 6.  Skin: Skin is warm, dry and intact. No rash noted. No cyanosis.  Psychiatric: He has a normal mood and affect. His speech is normal and behavior is normal.  Thought content normal.  Nursing note and vitals reviewed.    ED Treatments / Results  Labs (all labs ordered are listed, but only abnormal results are displayed) Labs Reviewed - No data to display  EKG  EKG Interpretation None       Radiology Ct Renal Stone Study  Result Date: 10/03/2016 CLINICAL DATA:  Patient with right flank pain and hematuria. EXAM: CT ABDOMEN AND PELVIS WITHOUT CONTRAST TECHNIQUE: Multidetector CT imaging of the abdomen and pelvis was performed following the standard protocol without IV contrast. COMPARISON:  CT abdomen pelvis 07/14/2014 FINDINGS: Lower chest: Normal heart size. Stable 5 mm right lower lobe pulmonary nodule and adjacent 4 mm right lower lobe pulmonary nodule (image 6 and image 9 ; series 4). Given stability over time these are favored to be benign etiology. Left basilar atelectasis atelectasis within the left lower lobe. No pleural effusion. Hepatobiliary: The liver is normal in size and contour. Gallbladder is unremarkable. Pancreas: Unremarkable Spleen: Unremarkable Adrenals/Urinary Tract: Adrenal glands are normal. Mild right pelviectasis. 4 mm stone within the proximal right ureter (image 54; series 2). Urinary bladder is unremarkable. Additional punctate 1- 2 mm stone inferior pole right kidney (image 58; series 5). Stomach/Bowel: No abnormal bowel wall thickening or evidence for bowel obstruction. No free fluid or free intraperitoneal air. Normal appendix. Normal morphology of the stomach. Vascular/Lymphatic: Normal caliber abdominal aorta. No retroperitoneal lymphadenopathy. Reproductive: Prostate is unremarkable. Other: None. Musculoskeletal: No aggressive or acute appearing osseous lesions. IMPRESSION: There is a 4 mm stone within the proximal right ureter resulting in mild right pelviectasis. Electronically Signed   By: Annia Beltrew  Davis M.D.   On: 10/03/2016 22:01    Procedures Procedures (including critical care time)  Medications Ordered in  ED Medications  HYDROmorphone (DILAUDID) injection 1 mg (not administered)  ondansetron (ZOFRAN-ODT) disintegrating tablet 8 mg (not administered)  ketorolac (TORADOL) injection 60 mg (not administered)     Initial Impression / Assessment and Plan / ED Course  I have reviewed the triage vital signs and the nursing notes.  Pertinent labs & imaging results that were available during my care of the patient were reviewed by me and considered in my medical decision making (see chart for details).     Patient returns with renal colic. He had a 4 mm proximal ureteral stone diagnosed earlier tonight. He was unable to fill prescriptions because of the late hour. Pain was improved at time of discharge but has returned and he did not have anything to take at home. Patient redosed with analgesia.  Final Clinical Impressions(s) / ED Diagnoses   Final diagnoses:  Renal colic on right side    New Prescriptions New Prescriptions   No medications on file     Gilda CreasePollina, Ceci Taliaferro J, MD 10/04/16 [phone]

## 2016-10-13 ENCOUNTER — Emergency Department (HOSPITAL_COMMUNITY)
Admission: EM | Admit: 2016-10-13 | Discharge: 2016-10-13 | Disposition: A | Discharge status: Home / Self Care | Payer: Self-pay | Attending: Emergency Medicine | Admitting: Emergency Medicine

## 2016-10-13 ENCOUNTER — Encounter (HOSPITAL_COMMUNITY): Payer: Self-pay | Admitting: *Deleted

## 2016-10-13 ENCOUNTER — Emergency Department (HOSPITAL_COMMUNITY): Payer: Self-pay

## 2016-10-13 DIAGNOSIS — J329 Chronic sinusitis, unspecified: Secondary | ICD-10-CM

## 2016-10-13 DIAGNOSIS — J019 Acute sinusitis, unspecified: Secondary | ICD-10-CM | POA: Insufficient documentation

## 2016-10-13 DIAGNOSIS — Z791 Long term (current) use of non-steroidal anti-inflammatories (NSAID): Secondary | ICD-10-CM | POA: Insufficient documentation

## 2016-10-13 DIAGNOSIS — R51 Headache: Secondary | ICD-10-CM | POA: Insufficient documentation

## 2016-10-13 DIAGNOSIS — Z79899 Other long term (current) drug therapy: Secondary | ICD-10-CM | POA: Insufficient documentation

## 2016-10-13 DIAGNOSIS — M62838 Other muscle spasm: Secondary | ICD-10-CM | POA: Insufficient documentation

## 2016-10-13 LAB — BASIC METABOLIC PANEL
Anion gap: 9 (ref 5–15)
BUN: 17 mg/dL (ref 6–20)
CHLORIDE: 104 mmol/L (ref 101–111)
CO2: 26 mmol/L (ref 22–32)
CREATININE: 1.15 mg/dL (ref 0.61–1.24)
Calcium: 9.1 mg/dL (ref 8.9–10.3)
GFR calc non Af Amer: >60 mL/min (ref >60)
Glucose, Bld: 85 mg/dL (ref 65–99)
Potassium: 3.5 mmol/L (ref 3.5–5.1)
SODIUM: 139 mmol/L (ref 135–145)

## 2016-10-13 LAB — CBC WITH DIFFERENTIAL/PLATELET
BASOS PCT: 0 %
Basophils Absolute: 0 10*3/uL (ref 0.0–0.1)
EOS ABS: 0.4 10*3/uL (ref 0.0–0.7)
Eosinophils Relative: 3 %
HEMATOCRIT: 45.9 % (ref 39.0–52.0)
Hemoglobin: 16 g/dL (ref 13.0–17.0)
LYMPHS ABS: 2.8 10*3/uL (ref 0.7–4.0)
Lymphocytes Relative: 27 %
MCH: 30.8 pg (ref 26.0–34.0)
MCHC: 34.9 g/dL (ref 30.0–36.0)
MCV: 88.3 fL (ref 78.0–100.0)
MONO ABS: 1 10*3/uL (ref 0.1–1.0)
MONOS PCT: 9 %
Neutro Abs: 6.4 10*3/uL (ref 1.7–7.7)
Neutrophils Relative %: 61 %
Platelets: 170 10*3/uL (ref 150–400)
RBC: 5.2 MIL/uL (ref 4.22–5.81)
RDW: 12.8 % (ref 11.5–15.5)
WBC: 10.6 10*3/uL — ABNORMAL HIGH (ref 4.0–10.5)

## 2016-10-13 MED ORDER — LORAZEPAM 2 MG/ML IJ SOLN
1.0000 mg | Freq: Once | INTRAMUSCULAR | Status: AC
  Administered 2016-10-13: 1 mg via INTRAVENOUS
  Filled 2016-10-13: qty 1

## 2016-10-13 MED ORDER — CYCLOBENZAPRINE HCL 10 MG PO TABS: 10.0000 mg | ORAL_TABLET | Freq: Three times a day (TID) | ORAL | 0 refills | Status: DC

## 2016-10-13 MED ORDER — LORATADINE-PSEUDOEPHEDRINE ER 5-120 MG PO TB12
1.0000 | ORAL_TABLET | Freq: Two times a day (BID) | ORAL | 0 refills | Status: DC
Start: 2016-10-13 — End: 2017-02-15

## 2016-10-13 MED ORDER — METHYLPREDNISOLONE SODIUM SUCC 125 MG IJ SOLR
125.0000 mg | Freq: Once | INTRAMUSCULAR | Status: AC
  Administered 2016-10-13: 125 mg via INTRAVENOUS
  Filled 2016-10-13: qty 2

## 2016-10-13 MED ORDER — DICLOFENAC SODIUM 75 MG PO TBEC: 75.0000 mg | DELAYED_RELEASE_TABLET | Freq: Two times a day (BID) | ORAL | 0 refills | Status: DC

## 2016-10-13 MED ORDER — KETOROLAC TROMETHAMINE 30 MG/ML IJ SOLN
30.0000 mg | Freq: Once | INTRAMUSCULAR | Status: AC
  Administered 2016-10-13: 30 mg via INTRAVENOUS
  Filled 2016-10-13: qty 1

## 2016-10-13 MED ORDER — CLINDAMYCIN HCL 150 MG PO CAPS: 150.0000 mg | ORAL_CAPSULE | Freq: Four times a day (QID) | ORAL | 0 refills | Status: DC

## 2016-10-13 NOTE — ED Triage Notes (Signed)
Pt c/o neck pain that radiates down back area with n/v that started today, denies any fever

## 2016-10-13 NOTE — Discharge Instructions (Signed)
The CT scan of your cervical spine suggest muscle spasms. There no fractures, no dislocations on. The CT scan of your brain and head are negative for acute changes, but does show a significant sinusitis. Please use Claritin-D and clindamycin on a daily. Please use Flexeril 3 times daily for spasm pain and diclofenac 2 times daily with a meal. Flexeril may cause drowsiness, please do not drive a vehicle, operating machinery, alcohol, or participate in activities requiring concentration when taking this medication. [facility] if not improving.

## 2016-10-15 NOTE — ED Provider Notes (Signed)
[facility] Provider Note   CSN: [id number] Arrival date & time: 10/13/16  2042     History   Chief Complaint Chief Complaint  Patient presents with  . Neck Pain    HPI Charles [name] is a 26 y.o. male.  Patient is a 26 year old male who presents to the emergency department with a complaint of neck pain that goes down to his back.  Patient states that earlier today he had pain from his neck and shoulder area that radiates down into his back, particularly with certain motion. He has not had any injury to his neck or back. He has intermittent headache. He has not had any high fever or unusual rash. He's had nausea and occasional episode of vomiting. His been no recent tick bites to be reported. It is of note that he has a history of back problems. He states that this pain feels different. He has not had any loss of bowel or bladder function. He is ambulatory without problem. He presents to the emergency department for additional evaluation.      Past Medical History:  Diagnosis Date  . Back pain   . Rib pain     There are no active problems to display for this patient.   Past Surgical History:  Procedure Laterality Date  . ADENOIDECTOMY    . TONSILLECTOMY         Home Medications    Prior to Admission medications   Medication Sig Start Date End Date Taking? Authorizing Provider  acetaminophen (TYLENOL) 500 MG tablet Take 1,000 mg by mouth every 6 (six) hours as needed for mild pain, moderate pain, fever or headache.    [provider]  clindamycin (CLEOCIN) 150 MG capsule Take 1 capsule (150 mg total) by mouth every 6 (six) hours. 10/13/16   Ivery Quale, PA-C  cyclobenzaprine (FLEXERIL) 10 MG tablet Take 1 tablet (10 mg total) by mouth 3 (three) times daily. 10/13/16   Ivery Quale, PA-C  diclofenac (VOLTAREN) 75 MG EC tablet Take 1 tablet (75 mg total) by mouth 2 (two) times daily. 10/13/16   Ivery Quale, PA-C  ibuprofen (ADVIL,MOTRIN) 800 MG  tablet Take 1 tablet (800 mg total) by mouth 3 (three) times daily. 10/03/16   Eber Hong, MD  loratadine-pseudoephedrine (CLARITIN-D 12 HOUR) 5-120 MG tablet Take 1 tablet by mouth 2 (two) times daily. 10/13/16   Ivery Quale, PA-C  oxyCODONE-acetaminophen (PERCOCET) 5-325 MG tablet Take 1 tablet by mouth every 4 (four) hours as needed. 10/03/16   Eber Hong, MD  oxyCODONE-acetaminophen (PERCOCET) 5-325 MG tablet Take 1-2 tablets by mouth every 4 (four) hours as needed. 10/04/16   Gilda Crease, MD  promethazine (PHENERGAN) 25 MG tablet Take 1 tablet (25 mg total) by mouth every 6 (six) hours as needed for nausea or vomiting. 10/03/16   Eber Hong, MD  tamsulosin (FLOMAX) 0.4 MG CAPS capsule Take 1 capsule (0.4 mg total) by mouth 2 (two) times daily. 10/03/16   Eber Hong, MD    Family History Family History  Problem Relation Age of Onset  . Alcohol abuse Father   . Drug abuse Father     Social History Social History  Substance Use Topics  . Smoking status: Current Every Day Smoker    Packs/day: 1.00    Years: 10.00    Types: Cigarettes  . Smokeless tobacco: Former Neurosurgeon    Types: Snuff  . Alcohol use Yes     Comment: occ     Allergies  Adhesive [tape]; Amoxicillin; Codeine; Penicillins; and Latex   Review of Systems Review of Systems  Constitutional: Negative for activity change and fever.       All ROS Neg except as noted in HPI  HENT: Positive for congestion and sinus pressure. Negative for nosebleeds.   Eyes: Negative for photophobia and discharge.  Respiratory: Negative for cough, shortness of breath and wheezing.   Cardiovascular: Negative for chest pain and palpitations.  Gastrointestinal: Positive for nausea and vomiting. Negative for abdominal pain and blood in stool.  Genitourinary: Negative for dysuria, frequency and hematuria.  Musculoskeletal: Positive for back pain and neck pain. Negative for arthralgias.  Skin: Negative.   Neurological:  Positive for headaches. Negative for dizziness, seizures and speech difficulty.  Psychiatric/Behavioral: Negative for confusion and hallucinations.     Physical Exam Updated Vital Signs BP 116/68   Pulse 83   Temp 98.5 F (36.9 C) (Oral)   Resp 20   Ht 5\' 5"  (1.651 m)   Wt 93.7 kg (206 lb 8 oz)   SpO2 98%   BMI 34.36 kg/m   Physical Exam  Constitutional: Vital signs are normal. He appears well-developed and well-nourished. He is active.  HENT:  Head: Normocephalic and atraumatic.  Right Ear: Tympanic membrane, external ear and ear canal normal.  Left Ear: Tympanic membrane, external ear and ear canal normal.  Nose: Nose normal.  Mouth/Throat: Uvula is midline, oropharynx is clear and moist and mucous membranes are normal.  Eyes: Conjunctivae, EOM and lids are normal. Pupils are equal, round, and reactive to light.  Neck: Trachea normal, normal range of motion and phonation normal. Neck supple. Carotid bruit is not present.  Cardiovascular: Normal rate, regular rhythm and normal pulses.   Abdominal: Soft. Normal appearance and bowel sounds are normal.  Musculoskeletal:       Cervical back: He exhibits pain and spasm.       Back:  Lymphadenopathy:       Head (right side): No submental, no preauricular and no posterior auricular adenopathy present.       Head (left side): No submental, no preauricular and no posterior auricular adenopathy present.    He has no cervical adenopathy.  Neurological: He is alert. He has normal strength. No cranial nerve deficit or sensory deficit. Coordination and gait normal. GCS eye subscore is 4. GCS verbal subscore is 5. GCS motor subscore is 6.  Skin: Skin is warm and dry.  Psychiatric: His speech is normal.     ED Treatments / Results  Labs (all labs ordered are listed, but only abnormal results are displayed) Labs Reviewed  CBC WITH DIFFERENTIAL/PLATELET - Abnormal; Notable for the following:       Result Value   WBC 10.6 (*)    All  other components within normal limits  BASIC METABOLIC PANEL    EKG  EKG Interpretation None       Radiology Ct Head Wo Contrast  Result Date: 10/13/2016 CLINICAL DATA:  Neck and shoulder pain extending to the back with intermittent headache starting at 8:30 a.m. today. EXAM: CT HEAD WITHOUT CONTRAST CT CERVICAL SPINE WITHOUT CONTRAST TECHNIQUE: Multidetector CT imaging of the head and cervical spine was performed following the standard protocol without intravenous contrast. Multiplanar CT image reconstructions of the cervical spine were also generated. COMPARISON:  07/08/2016 FINDINGS: CT HEAD FINDINGS Brain: The brainstem, cerebellum, cerebral peduncles, thalami, basal ganglia, basilar cisterns, and ventricular system appear within normal limits. No intracranial hemorrhage, mass lesion, or acute CVA. Vascular:  Unremarkable Skull: Unremarkable Sinuses/Orbits: Chronic ethmoid, maxillary, left sphenoid, and frontal sinusitis. Effusion of the right mastoid air cells. There is complete opacification of some of the ethmoid air cells, in the upper margins of the mastoid air cells are completely opacified although only the upper margins are included. Visualized orbits unremarkable. Other: No supplemental non-categorized findings. CT CERVICAL SPINE FINDINGS Alignment: There is straightening of the normal cervical lordosis without malalignment. Skull base and vertebrae: No cervical spine fracture or acute bony findings. No significant bony lesions are observed. Soft tissues and spinal canal: No prevertebral soft tissue swelling. Overall unremarkable. Disc levels:  No individual level impingement identified. Upper chest: Unremarkable Other: No supplemental non-categorized findings. IMPRESSION: 1. Considerable paranasal sinusitis. Right mastoid effusion. The right mastoid effusion is slightly worsened compared to prior but otherwise the sinusitis appears similar. 2. No acute intracranial findings or acute  cervical spine findings. 3. Straightening of the normal cervical lordosis, similar to prior. No fracture or subluxation. No obvious signs of impingement. Electronically Signed   By: Gaylyn RongWalter  Liebkemann M.D.   On: 10/13/2016 23:16   Ct Cervical Spine Wo Contrast  Result Date: 10/13/2016 CLINICAL DATA:  Neck and shoulder pain extending to the back with intermittent headache starting at 8:30 a.m. today. EXAM: CT HEAD WITHOUT CONTRAST CT CERVICAL SPINE WITHOUT CONTRAST TECHNIQUE: Multidetector CT imaging of the head and cervical spine was performed following the standard protocol without intravenous contrast. Multiplanar CT image reconstructions of the cervical spine were also generated. COMPARISON:  07/08/2016 FINDINGS: CT HEAD FINDINGS Brain: The brainstem, cerebellum, cerebral peduncles, thalami, basal ganglia, basilar cisterns, and ventricular system appear within normal limits. No intracranial hemorrhage, mass lesion, or acute CVA. Vascular: Unremarkable Skull: Unremarkable Sinuses/Orbits: Chronic ethmoid, maxillary, left sphenoid, and frontal sinusitis. Effusion of the right mastoid air cells. There is complete opacification of some of the ethmoid air cells, in the upper margins of the mastoid air cells are completely opacified although only the upper margins are included. Visualized orbits unremarkable. Other: No supplemental non-categorized findings. CT CERVICAL SPINE FINDINGS Alignment: There is straightening of the normal cervical lordosis without malalignment. Skull base and vertebrae: No cervical spine fracture or acute bony findings. No significant bony lesions are observed. Soft tissues and spinal canal: No prevertebral soft tissue swelling. Overall unremarkable. Disc levels:  No individual level impingement identified. Upper chest: Unremarkable Other: No supplemental non-categorized findings. IMPRESSION: 1. Considerable paranasal sinusitis. Right mastoid effusion. The right mastoid effusion is slightly  worsened compared to prior but otherwise the sinusitis appears similar. 2. No acute intracranial findings or acute cervical spine findings. 3. Straightening of the normal cervical lordosis, similar to prior. No fracture or subluxation. No obvious signs of impingement. Electronically Signed   By: Gaylyn RongWalter  Liebkemann M.D.   On: 10/13/2016 23:16    Procedures Procedures (including critical care time)  Medications Ordered in ED Medications  LORazepam (ATIVAN) injection 1 mg (1 mg Intravenous Given 10/13/16 2238)  methylPREDNISolone sodium succinate (SOLU-MEDROL) 125 mg/2 mL injection 125 mg (125 mg Intravenous Given 10/13/16 2238)  ketorolac (TORADOL) 30 MG/ML injection 30 mg (30 mg Intravenous Given 10/13/16 2238)     Initial Impression / Assessment and Plan / ED Course  I have reviewed the triage vital signs and the nursing notes.  Pertinent labs & imaging results that were available during my care of the patient were reviewed by me and considered in my medical decision making (see chart for details).       Final Clinical Impressions(s) / ED  Diagnoses MDM Vital signs reviewed. Pulse oximetry is 98% on room air. The patient does not appear in acute distress, but has pain with movement of his neck and shoulders. The white blood cell count slightly elevated at 10.6 there is no shift to the left. The basic metabolic panel is well within normal limits. The CT scan of the head is negative for acute intracranial hemorrhage, mass, or CVA. CT scan of the cervical spine reveals loss of the lordotic curve cervical spine area. There is no fracture and there is no subluxation appreciated. The pain can easily be reproduced with certain motion of the neck and shoulders. Is no high fever, and no hot joints appreciated there is no unusual rash is no history of any mental confusion is no reported light sensitivity. The patient is exiting on his phone with lights on without any problem. He is ambulatory without  problem. Have a very low suspicion for meningitis.  The plan at this time is for the patient be treated with clindamycin and Claritin-D. The patient will also be treated with Flexeril and neck. Passive to apply heating pad to his neck and back area. The patient is to follow-up with his physicians at the [facility]. He will return to the emergency department if any changes, problems, or concerns.    Final diagnoses:  Trapezius muscle spasm  Sinusitis, unspecified chronicity, unspecified location    New Prescriptions Discharge Medication List as of 10/13/2016 11:48 PM    START taking these medications   Details  clindamycin (CLEOCIN) 150 MG capsule Take 1 capsule (150 mg total) by mouth every 6 (six) hours., Starting Wed 10/13/2016, Print    cyclobenzaprine (FLEXERIL) 10 MG tablet Take 1 tablet (10 mg total) by mouth 3 (three) times daily., Starting Wed 10/13/2016, Print    diclofenac (VOLTAREN) 75 MG EC tablet Take 1 tablet (75 mg total) by mouth 2 (two) times daily., Starting Wed 10/13/2016, Print    loratadine-pseudoephedrine (CLARITIN-D 12 HOUR) 5-120 MG tablet Take 1 tablet by mouth 2 (two) times daily., Starting Wed 10/13/2016, Print         Ivery Quale, PA-C 10/15/16 1151    Bethann Berkshire, MD 10/15/16 2318

## 2016-10-18 ENCOUNTER — Emergency Department (HOSPITAL_COMMUNITY)
Admission: EM | Admit: 2016-10-18 | Discharge: 2016-10-18 | Discharge status: Against Medical Advice | Payer: Self-pay | Attending: Emergency Medicine | Admitting: Emergency Medicine

## 2016-10-18 ENCOUNTER — Emergency Department (HOSPITAL_COMMUNITY): Payer: Self-pay

## 2016-10-18 DIAGNOSIS — R1031 Right lower quadrant pain: Secondary | ICD-10-CM | POA: Insufficient documentation

## 2016-10-18 DIAGNOSIS — R109 Unspecified abdominal pain: Secondary | ICD-10-CM

## 2016-10-18 DIAGNOSIS — F17210 Nicotine dependence, cigarettes, uncomplicated: Secondary | ICD-10-CM | POA: Insufficient documentation

## 2016-10-18 DIAGNOSIS — Z91040 Latex allergy status: Secondary | ICD-10-CM | POA: Insufficient documentation

## 2016-10-18 DIAGNOSIS — Z79899 Other long term (current) drug therapy: Secondary | ICD-10-CM | POA: Insufficient documentation

## 2016-10-18 LAB — URINALYSIS, ROUTINE W REFLEX MICROSCOPIC
BILIRUBIN URINE: NEGATIVE
GLUCOSE, UA: NEGATIVE mg/dL
KETONES UR: NEGATIVE mg/dL
LEUKOCYTES UA: NEGATIVE
NITRITE: NEGATIVE
PH: 5 (ref 5.0–8.0)
Protein, ur: NEGATIVE mg/dL
SPECIFIC GRAVITY, URINE: 1.026 (ref 1.005–1.030)
Squamous Epithelial / LPF: NONE SEEN

## 2016-10-18 MED ORDER — HYDROMORPHONE HCL 1 MG/ML IJ SOLN
1.0000 mg | Freq: Once | INTRAMUSCULAR | Status: AC
  Administered 2016-10-18: 1 mg via INTRAMUSCULAR
  Filled 2016-10-18: qty 1

## 2016-10-18 MED ORDER — KETOROLAC TROMETHAMINE 60 MG/2ML IM SOLN
60.0000 mg | Freq: Once | INTRAMUSCULAR | Status: AC
  Administered 2016-10-18: 60 mg via INTRAMUSCULAR
  Filled 2016-10-18: qty 2

## 2016-10-18 NOTE — ED Provider Notes (Signed)
[facility] Provider Note   CSN: [id number] Arrival date & time: 10/18/16  1700     History   Chief Complaint Chief Complaint  Patient presents with  . Flank Pain    HPI Charles [name] is a 26 y.o. male.  The history is provided by the patient. No language interpreter was used.  Flank Pain  This is a new problem. The current episode started yesterday. The problem occurs constantly. The problem has been gradually worsening. Nothing aggravates the symptoms. Nothing relieves the symptoms. He has tried nothing for the symptoms.  Pt complains of right sided flank pain.  Pt reports he was diagnosed with a kidney stone 2 weeks ago.  Pt reports pain is the same as previous pain.  Pt reports pain stopped for several days but started again last night.  Past Medical History:  Diagnosis Date  . Back pain   . Rib pain     There are no active problems to display for this patient.   Past Surgical History:  Procedure Laterality Date  . ADENOIDECTOMY    . TONSILLECTOMY         Home Medications    Prior to Admission medications   Medication Sig Start Date End Date Taking? Authorizing Provider  acetaminophen (TYLENOL) 500 MG tablet Take 1,000 mg by mouth every 6 (six) hours as needed for mild pain, moderate pain, fever or headache.    [provider]  clindamycin (CLEOCIN) 150 MG capsule Take 1 capsule (150 mg total) by mouth every 6 (six) hours. 10/13/16   Ivery Quale, PA-C  cyclobenzaprine (FLEXERIL) 10 MG tablet Take 1 tablet (10 mg total) by mouth 3 (three) times daily. 10/13/16   Ivery Quale, PA-C  diclofenac (VOLTAREN) 75 MG EC tablet Take 1 tablet (75 mg total) by mouth 2 (two) times daily. 10/13/16   Ivery Quale, PA-C  ibuprofen (ADVIL,MOTRIN) 800 MG tablet Take 1 tablet (800 mg total) by mouth 3 (three) times daily. 10/03/16   Eber Hong, MD  loratadine-pseudoephedrine (CLARITIN-D 12 HOUR) 5-120 MG tablet Take 1 tablet by mouth 2 (two) times daily.  10/13/16   Ivery Quale, PA-C  oxyCODONE-acetaminophen (PERCOCET) 5-325 MG tablet Take 1 tablet by mouth every 4 (four) hours as needed. 10/03/16   Eber Hong, MD  oxyCODONE-acetaminophen (PERCOCET) 5-325 MG tablet Take 1-2 tablets by mouth every 4 (four) hours as needed. 10/04/16   Gilda Crease, MD  promethazine (PHENERGAN) 25 MG tablet Take 1 tablet (25 mg total) by mouth every 6 (six) hours as needed for nausea or vomiting. 10/03/16   Eber Hong, MD  tamsulosin (FLOMAX) 0.4 MG CAPS capsule Take 1 capsule (0.4 mg total) by mouth 2 (two) times daily. 10/03/16   Eber Hong, MD    Family History Family History  Problem Relation Age of Onset  . Alcohol abuse Father   . Drug abuse Father     Social History Social History  Substance Use Topics  . Smoking status: Current Every Day Smoker    Packs/day: 1.00    Years: 10.00    Types: Cigarettes  . Smokeless tobacco: Former Neurosurgeon    Types: Snuff  . Alcohol use Yes     Comment: occ     Allergies   Adhesive [tape]; Amoxicillin; Codeine; Penicillins; and Latex   Review of Systems Review of Systems  Genitourinary: Positive for flank pain.  All other systems reviewed and are negative.    Physical Exam Updated Vital Signs BP (!) 136/93 (BP  Location: Right Arm)   Pulse (!) 59   Temp 98 F (36.7 C) (Oral)   Resp 18   Ht 5\' 5"  (1.651 m)   Wt 92.5 kg (204 lb)   SpO2 99%   BMI 33.95 kg/m   Physical Exam  Constitutional: He appears well-developed and well-nourished.  HENT:  Head: Normocephalic and atraumatic.  Eyes: Conjunctivae are normal.  Neck: Neck supple.  Cardiovascular: Normal rate and regular rhythm.   No murmur heard. Pulmonary/Chest: Effort normal and breath sounds normal. No respiratory distress.  Abdominal: Soft. There is no tenderness.  Musculoskeletal: He exhibits no edema.  Neurological: He is alert.  Skin: Skin is warm and dry.  Psychiatric: He has a normal mood and affect.  Nursing note and  vitals reviewed.    ED Treatments / Results  Labs (all labs ordered are listed, but only abnormal results are displayed) Labs Reviewed  URINALYSIS, ROUTINE W REFLEX MICROSCOPIC - Abnormal; Notable for the following:       Result Value   APPearance CLOUDY (*)    Hgb urine dipstick MODERATE (*)    Bacteria, UA RARE (*)    All other components within normal limits    EKG  EKG Interpretation None       Radiology No results found.  Procedures Procedures (including critical care time)  Medications Ordered in ED Medications  ketorolac (TORADOL) injection 60 mg (60 mg Intramuscular Given 10/18/16 1806)  HYDROmorphone (DILAUDID) injection 1 mg (1 mg Intramuscular Given 10/18/16 1805)     Initial Impression / Assessment and Plan / ED Course  I have reviewed the triage vital signs and the nursing notes.  Pertinent labs & imaging results that were available during my care of the patient were reviewed by me and considered in my medical decision making (see chart for details).      Pt has blood in urine.  Pt had some relief with torodol and dilaudid.  Ct scan ordered.    Pt had prolonged wait for Ct scan.  Pt left before scan.   Final Clinical Impressions(s) / ED Diagnoses   Final diagnoses:  Right flank pain    New Prescriptions New Prescriptions   No medications on file     Osie CheeksSofia, Jereme Loren K, PA-C 10/18/16 2247    Elson AreasSofia, Laxmi Choung K, PA-C 10/18/16 2248    Margarita Grizzleay, Danielle, MD 10/27/16 [phone]

## 2016-10-18 NOTE — ED Notes (Signed)
Pt did not want to stay for the CT said he had to work in the morning and could not wait any longer.

## 2016-10-18 NOTE — ED Triage Notes (Signed)
Pt states that he is having flank pain on the right side that began at 11:30 pm last night.  Pt states that he has a history of flank pain and "this is worse than what I had two weeks ago" hx of kidney stones.

## 2016-10-18 NOTE — ED Notes (Signed)
Pt unable to give urine specimen at this time 

## 2016-10-19 ENCOUNTER — Ambulatory Visit (HOSPITAL_COMMUNITY): Admission: RE | Admit: 2016-10-19 | Payer: MEDICAID | Source: Ambulatory Visit

## 2016-10-19 ENCOUNTER — Emergency Department (HOSPITAL_COMMUNITY)
Admission: EM | Admit: 2016-10-19 | Discharge: 2016-10-19 | Disposition: A | Discharge status: Home / Self Care | Payer: Self-pay | Attending: Emergency Medicine | Admitting: Emergency Medicine

## 2016-10-19 ENCOUNTER — Emergency Department (HOSPITAL_COMMUNITY): Payer: Self-pay

## 2016-10-19 ENCOUNTER — Encounter (HOSPITAL_COMMUNITY): Payer: Self-pay | Admitting: Emergency Medicine

## 2016-10-19 DIAGNOSIS — N23 Unspecified renal colic: Secondary | ICD-10-CM | POA: Insufficient documentation

## 2016-10-19 DIAGNOSIS — N289 Disorder of kidney and ureter, unspecified: Secondary | ICD-10-CM | POA: Insufficient documentation

## 2016-10-19 DIAGNOSIS — F17210 Nicotine dependence, cigarettes, uncomplicated: Secondary | ICD-10-CM | POA: Insufficient documentation

## 2016-10-19 LAB — CBC WITH DIFFERENTIAL/PLATELET
Basophils Absolute: 0 10*3/uL (ref 0.0–0.1)
Basophils Relative: 0 %
EOS ABS: 0.3 10*3/uL (ref 0.0–0.7)
EOS PCT: 2 %
HCT: 45.6 % (ref 39.0–52.0)
Hemoglobin: 16.2 g/dL (ref 13.0–17.0)
LYMPHS ABS: 1.6 10*3/uL (ref 0.7–4.0)
LYMPHS PCT: 9 %
MCH: 30.6 pg (ref 26.0–34.0)
MCHC: 35.5 g/dL (ref 30.0–36.0)
MCV: 86.2 fL (ref 78.0–100.0)
MONO ABS: 1.4 10*3/uL — AB (ref 0.1–1.0)
Monocytes Relative: 8 %
Neutro Abs: 14.1 10*3/uL — ABNORMAL HIGH (ref 1.7–7.7)
Neutrophils Relative %: 81 %
PLATELETS: 150 10*3/uL (ref 150–400)
RBC: 5.29 MIL/uL (ref 4.22–5.81)
RDW: 12.6 % (ref 11.5–15.5)
WBC: 17.5 10*3/uL — ABNORMAL HIGH (ref 4.0–10.5)

## 2016-10-19 LAB — COMPREHENSIVE METABOLIC PANEL
ALK PHOS: 75 U/L (ref 38–126)
ALT: 18 U/L (ref 17–63)
AST: 19 U/L (ref 15–41)
Albumin: 4.1 g/dL (ref 3.5–5.0)
Anion gap: 10 (ref 5–15)
BUN: 24 mg/dL — AB (ref 6–20)
CALCIUM: 9.1 mg/dL (ref 8.9–10.3)
CHLORIDE: 102 mmol/L (ref 101–111)
CO2: 24 mmol/L (ref 22–32)
CREATININE: 1.76 mg/dL — AB (ref 0.61–1.24)
GFR calc non Af Amer: 51 mL/min — ABNORMAL LOW (ref >60)
GFR, EST AFRICAN AMERICAN: 59 mL/min — AB (ref >60)
Glucose, Bld: 107 mg/dL — ABNORMAL HIGH (ref 65–99)
Potassium: 3.6 mmol/L (ref 3.5–5.1)
SODIUM: 136 mmol/L (ref 135–145)
Total Bilirubin: 0.6 mg/dL (ref 0.3–1.2)
Total Protein: 6.8 g/dL (ref 6.5–8.1)

## 2016-10-19 LAB — LIPASE, BLOOD: Lipase: 21 U/L (ref 11–51)

## 2016-10-19 MED ORDER — KETOROLAC TROMETHAMINE 30 MG/ML IJ SOLN
30.0000 mg | Freq: Once | INTRAMUSCULAR | Status: AC
  Administered 2016-10-19: 30 mg via INTRAVENOUS
  Filled 2016-10-19: qty 1

## 2016-10-19 MED ORDER — MORPHINE SULFATE (PF) 4 MG/ML IV SOLN
4.0000 mg | Freq: Once | INTRAVENOUS | Status: AC
  Administered 2016-10-19: 4 mg via INTRAVENOUS
  Filled 2016-10-19: qty 1

## 2016-10-19 MED ORDER — SODIUM CHLORIDE 0.9 % IV BOLUS (SEPSIS)
1000.0000 mL | Freq: Once | INTRAVENOUS | Status: AC
  Administered 2016-10-19: 1000 mL via INTRAVENOUS

## 2016-10-19 MED ORDER — ONDANSETRON HCL 4 MG/2ML IJ SOLN
4.0000 mg | Freq: Once | INTRAMUSCULAR | Status: AC
  Administered 2016-10-19: 4 mg via INTRAVENOUS
  Filled 2016-10-19: qty 2

## 2016-10-19 NOTE — ED Triage Notes (Signed)
Pt c/o increased right lower quadrant pain that radiates across abd since earlier tonight.

## 2016-10-19 NOTE — ED Provider Notes (Signed)
[facility] Provider Note   CSN: [id number] Arrival date & time: 10/19/16  0506     History   Chief Complaint Chief Complaint  Patient presents with  . Abdominal Pain    HPI Charles [name] is a 26 y.o. male.  The history is provided by the patient.  He has a history of kidney stones. He was in emergency department last night for right flank pain which has started the night before. He was told to return if pain worsens. Pain is now present primarily in the right lower abdomen with some radiation to the right flank and across the suprapubic area. He rates pain 10/10. Nothing makes it better nothing makes it worse. There is associated nausea but no vomiting. He denies any urinary symptoms. Pain is very similar to what he had with the kidney stone 2 weeks ago.  Past Medical History:  Diagnosis Date  . Back pain   . Rib pain     There are no active problems to display for this patient.   Past Surgical History:  Procedure Laterality Date  . ADENOIDECTOMY    . TONSILLECTOMY         Home Medications    Prior to Admission medications   Medication Sig Start Date End Date Taking? Authorizing Provider  acetaminophen (TYLENOL) 500 MG tablet Take 1,000 mg by mouth every 6 (six) hours as needed for mild pain, moderate pain, fever or headache.    [provider]  clindamycin (CLEOCIN) 150 MG capsule Take 1 capsule (150 mg total) by mouth every 6 (six) hours. 10/13/16   Ivery Quale, PA-C  cyclobenzaprine (FLEXERIL) 10 MG tablet Take 1 tablet (10 mg total) by mouth 3 (three) times daily. 10/13/16   Ivery Quale, PA-C  diclofenac (VOLTAREN) 75 MG EC tablet Take 1 tablet (75 mg total) by mouth 2 (two) times daily. 10/13/16   Ivery Quale, PA-C  ibuprofen (ADVIL,MOTRIN) 800 MG tablet Take 1 tablet (800 mg total) by mouth 3 (three) times daily. 10/03/16   Eber Hong, MD  loratadine-pseudoephedrine (CLARITIN-D 12 HOUR) 5-120 MG tablet Take 1 tablet by mouth 2 (two) times  daily. 10/13/16   Ivery Quale, PA-C  oxyCODONE-acetaminophen (PERCOCET) 5-325 MG tablet Take 1 tablet by mouth every 4 (four) hours as needed. 10/03/16   Eber Hong, MD  oxyCODONE-acetaminophen (PERCOCET) 5-325 MG tablet Take 1-2 tablets by mouth every 4 (four) hours as needed. 10/04/16   Gilda Crease, MD  promethazine (PHENERGAN) 25 MG tablet Take 1 tablet (25 mg total) by mouth every 6 (six) hours as needed for nausea or vomiting. 10/03/16   Eber Hong, MD  tamsulosin (FLOMAX) 0.4 MG CAPS capsule Take 1 capsule (0.4 mg total) by mouth 2 (two) times daily. 10/03/16   Eber Hong, MD    Family History Family History  Problem Relation Age of Onset  . Alcohol abuse Father   . Drug abuse Father     Social History Social History  Substance Use Topics  . Smoking status: Current Every Day Smoker    Packs/day: 1.00    Years: 10.00    Types: Cigarettes  . Smokeless tobacco: Former Neurosurgeon    Types: Snuff  . Alcohol use Yes     Comment: occ     Allergies   Adhesive [tape]; Amoxicillin; Codeine; Penicillins; and Latex   Review of Systems Review of Systems  All other systems reviewed and are negative.    Physical Exam Updated Vital Signs BP (!) 141/80 (BP  Location: Left Arm)   Pulse 69   Temp 97.9 F (36.6 C) (Oral)   Resp 18   Ht 5\' 5"  (1.651 m)   Wt 92.5 kg (204 lb)   SpO2 96%   BMI 33.95 kg/m   Physical Exam  Nursing note and vitals reviewed.  26 year old male, resting comfortably and in no acute distress. Vital signs are significant for borderline hypertension. Oxygen saturation is 96%, which is normal. Head is normocephalic and atraumatic. PERRLA, EOMI. Oropharynx is clear. Neck is nontender and supple without adenopathy or JVD. Back is nontender in the midline. There is mild right CVA tenderness. Lungs are clear without rales, wheezes, or rhonchi. Chest is nontender. Heart has regular rate and rhythm without murmur. Abdomen is soft, flat, with moderate  right lower quadrant tenderness. There is no rebound or guarding. There are no masses or hepatosplenomegaly and peristalsis is hypoactive. Extremities have no cyanosis or edema, full range of motion is present. Skin is warm and dry without rash. Neurologic: Mental status is normal, cranial nerves are intact, there are no motor or sensory deficits.  ED Treatments / Results  Labs (all labs ordered are listed, but only abnormal results are displayed) Labs Reviewed  COMPREHENSIVE METABOLIC PANEL - Abnormal; Notable for the following:       Result Value   Glucose, Bld 107 (*)    BUN 24 (*)    Creatinine, Ser 1.76 (*)    GFR calc non Af Amer 51 (*)    GFR calc Af Amer 59 (*)    All other components within normal limits  CBC WITH DIFFERENTIAL/PLATELET - Abnormal; Notable for the following:    WBC 17.5 (*)    Neutro Abs 14.1 (*)    Monocytes Absolute 1.4 (*)    All other components within normal limits  LIPASE, BLOOD    Radiology Ct Renal Stone Study  Result Date: 10/19/2016 CLINICAL DATA:  26 year old male with right flank pain since last night. History kidney stones. Initial encounter. EXAM: CT ABDOMEN AND PELVIS WITHOUT CONTRAST TECHNIQUE: Multidetector CT imaging of the abdomen and pelvis was performed following the standard protocol without IV contrast. COMPARISON:  10/03/2016 and 07/04/2014. FINDINGS: Lower chest: Basilar subsegmental atelectasis. Right lower lobe pulmonary nodules not as well delineated as on the recent exam. Hepatobiliary: Taking into account limitation by non contrast imaging, no worrisome hepatic lesion. Gallbladder sludge suspected without calcified gallstone. Pancreas: Taking into account limitation by non contrast imaging, no mass or inflammation. Spleen: Taking into account limitation by non contrast imaging, no mass or enlargement. Adrenals/Urinary Tract: Recently noted (10/03/2016) 4 mm mid right ureteral stone has traversed into the region of the right  ureterovesical junction and is causing moderate to marked right hydroureteronephrosis with extravasation of urine. Taking into account limitation by non contrast imaging, no worrisome adrenal or renal mass. No obvious bladder mass. Stomach/Bowel: No extraluminal bowel inflammatory process or free air. Specifically, no inflammation surrounds the appendix or terminal ileum. Vascular/Lymphatic: No aneurysm or adenopathy. Reproductive: Slight haziness adjacent to right seminal vesicle felt to be related to right renal collecting system obstruction. Other: No bowel containing hernia. Musculoskeletal: No acute osseous abnormality. IMPRESSION: Recently noted (10/03/2016) 4 mm mid right ureteral stone has traversed into the region of the right ureterovesical junction and is causing moderate to marked right hydroureteronephrosis with extravasation of urine. Electronically Signed   By: Lacy DuverneySteven  Olson M.D.   On: 10/19/2016 07:41    Procedures Procedures (including critical care time)  Medications Ordered in ED Medications  morphine 4 MG/ML injection 4 mg (4 mg Intravenous Given 10/19/16 0550)  ondansetron (ZOFRAN) injection 4 mg (4 mg Intravenous Given 10/19/16 0547)  sodium chloride 0.9 % bolus 1,000 mL (0 mLs Intravenous Stopped 10/19/16 0700)  ketorolac (TORADOL) 30 MG/ML injection 30 mg (30 mg Intravenous Given 10/19/16 0549)  sodium chloride 0.9 % bolus 1,000 mL (1,000 mLs Intravenous New Bag/Given 10/19/16 0733)     Initial Impression / Assessment and Plan / ED Course  I have reviewed the triage vital signs and the nursing notes.  Pertinent labs & imaging results that were available during my care of the patient were reviewed by me and considered in my medical decision making (see chart for details).  Right-sided abdominal pain strongly suggestive of recurrent biliary colic. Old records are reviewed, and he did have a 3 mm calculus in the right ureter with additional renal calculi. He does not recall the  stone passing. ED evaluation last night did show significant hematuria. I do not see an indication to repeat urinalysis. We'll check screening labs and give a dose of ketorolac, morphine, ondansetron.  Creatinine is come back significantly elevated when compared with results from one week ago. He has had excellent relief of pain with above noted treatment. CT appears to show calculus having passed into the bladder, with residual hydronephrosis. He will need to have his creatinine rechecked in one week to make sure that it is coming down. I suspect he will not need anything stronger for pain now that the stone has passed into the bladder, so he is not sent home with any narcotic prescriptions.  Official reading does state stone is in the UVJ. However, patient is completely pain-free which is more consistent with stone in the bladder. Should he have recurrence of pain, he will return to the ED. He is given referral to urology as well.  Final Clinical Impressions(s) / ED Diagnoses   Final diagnoses:  None    New Prescriptions New Prescriptions   No medications on file     Dione Booze, MD 10/19/16 [phone]

## 2016-10-19 NOTE — Discharge Instructions (Signed)
Drink plenty of fluids. Take ibuprofen or naproxen as needed.  Your creatinine (a blood test of your kidney) was elevated today.. Please see your doctor in one week to repeat the test to make sure it has come back to normal.

## 2016-10-20 ENCOUNTER — Emergency Department (HOSPITAL_COMMUNITY): Payer: Self-pay

## 2016-10-20 ENCOUNTER — Emergency Department (HOSPITAL_COMMUNITY)
Admission: EM | Admit: 2016-10-20 | Discharge: 2016-10-21 | Disposition: A | Discharge status: Home / Self Care | Payer: Self-pay | Attending: Emergency Medicine | Admitting: Emergency Medicine

## 2016-10-20 ENCOUNTER — Encounter (HOSPITAL_COMMUNITY): Payer: Self-pay | Admitting: *Deleted

## 2016-10-20 DIAGNOSIS — F17210 Nicotine dependence, cigarettes, uncomplicated: Secondary | ICD-10-CM | POA: Insufficient documentation

## 2016-10-20 DIAGNOSIS — E860 Dehydration: Secondary | ICD-10-CM | POA: Insufficient documentation

## 2016-10-20 DIAGNOSIS — Z91040 Latex allergy status: Secondary | ICD-10-CM | POA: Insufficient documentation

## 2016-10-20 DIAGNOSIS — Y929 Unspecified place or not applicable: Secondary | ICD-10-CM | POA: Insufficient documentation

## 2016-10-20 DIAGNOSIS — Y939 Activity, unspecified: Secondary | ICD-10-CM | POA: Insufficient documentation

## 2016-10-20 DIAGNOSIS — Z79899 Other long term (current) drug therapy: Secondary | ICD-10-CM | POA: Insufficient documentation

## 2016-10-20 DIAGNOSIS — W0110XA Fall on same level from slipping, tripping and stumbling with subsequent striking against unspecified object, initial encounter: Secondary | ICD-10-CM | POA: Insufficient documentation

## 2016-10-20 DIAGNOSIS — R55 Syncope and collapse: Secondary | ICD-10-CM | POA: Insufficient documentation

## 2016-10-20 DIAGNOSIS — Y999 Unspecified external cause status: Secondary | ICD-10-CM | POA: Insufficient documentation

## 2016-10-20 DIAGNOSIS — N179 Acute kidney failure, unspecified: Secondary | ICD-10-CM | POA: Insufficient documentation

## 2016-10-20 DIAGNOSIS — S2232XA Fracture of one rib, left side, initial encounter for closed fracture: Secondary | ICD-10-CM | POA: Insufficient documentation

## 2016-10-20 LAB — CBC WITH DIFFERENTIAL/PLATELET
Basophils Absolute: 0 10*3/uL (ref 0.0–0.1)
Basophils Relative: 0 %
EOS PCT: 2 %
Eosinophils Absolute: 0.3 10*3/uL (ref 0.0–0.7)
HCT: 39.6 % (ref 39.0–52.0)
HEMOGLOBIN: 13.8 g/dL (ref 13.0–17.0)
LYMPHS ABS: 1.9 10*3/uL (ref 0.7–4.0)
LYMPHS PCT: 14 %
MCH: 30.6 pg (ref 26.0–34.0)
MCHC: 34.8 g/dL (ref 30.0–36.0)
MCV: 87.8 fL (ref 78.0–100.0)
Monocytes Absolute: 1.2 10*3/uL — ABNORMAL HIGH (ref 0.1–1.0)
Monocytes Relative: 9 %
Neutro Abs: 10.5 10*3/uL — ABNORMAL HIGH (ref 1.7–7.7)
Neutrophils Relative %: 75 %
PLATELETS: 144 10*3/uL — AB (ref 150–400)
RBC: 4.51 MIL/uL (ref 4.22–5.81)
RDW: 12.8 % (ref 11.5–15.5)
WBC: 14 10*3/uL — ABNORMAL HIGH (ref 4.0–10.5)

## 2016-10-20 LAB — BASIC METABOLIC PANEL
Anion gap: 9 (ref 5–15)
BUN: 20 mg/dL (ref 6–20)
CHLORIDE: 105 mmol/L (ref 101–111)
CO2: 22 mmol/L (ref 22–32)
Calcium: 9.1 mg/dL (ref 8.9–10.3)
Creatinine, Ser: 2.03 mg/dL — ABNORMAL HIGH (ref 0.61–1.24)
GFR calc Af Amer: 50 mL/min — ABNORMAL LOW (ref >60)
GFR calc non Af Amer: 43 mL/min — ABNORMAL LOW (ref >60)
GLUCOSE: 103 mg/dL — AB (ref 65–99)
POTASSIUM: 3.4 mmol/L — AB (ref 3.5–5.1)
Sodium: 136 mmol/L (ref 135–145)

## 2016-10-20 MED ORDER — SODIUM CHLORIDE 0.9 % IV BOLUS (SEPSIS)
1000.0000 mL | Freq: Once | INTRAVENOUS | Status: AC
  Administered 2016-10-20: 1000 mL via INTRAVENOUS

## 2016-10-20 NOTE — ED Notes (Signed)
Patient transported to X-ray 

## 2016-10-20 NOTE — ED Triage Notes (Addendum)
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

## 2016-10-20 NOTE — ED Provider Notes (Signed)
[facility]-EMERGENCY DEPT Provider Note   CSN: [id number] Arrival date & time: 10/20/16  2134     History   Chief Complaint Chief Complaint  Patient presents with  . Loss of Consciousness    HPI Charles [name] is a 26 y.o. male.  HPI  Patient presents to the ED for loss of consciousness that occurred prior to arrival. He states that he was sitting outside when he all of a sudden became really hot. He states similar history of this in the past when he was playing football. He states that afterwards he got in for a cold shower. It was there that he passed out, per wife. She states that she tried lifting him up and she was unable to due to his size. She states that he became drenched in sweat after waking up. She states that he continued to have 3 episodes of loss of consciousness the remainder day. She states that he was "out for about 1 minute" each time until she splashed water on his face. Patient states that he is having left-sided rib pain and he is unsure if it was due to the fall. He also complains of shortness of breath which she states is slowly resolving. He reports some nausea as well as diarrhea for the past 2-3 days. He reports decrease in appetite due to the pain from his kidney stones which she was seen for it in the ED yesterday. Denies any blood in stool, hematuria, dysuria, vision changes, headache, head injury, gait abnormalities, numbness, weakness.  Past Medical History:  Diagnosis Date  . Back pain   . Rib pain     There are no active problems to display for this patient.   Past Surgical History:  Procedure Laterality Date  . ADENOIDECTOMY    . TONSILLECTOMY         Home Medications    Prior to Admission medications   Medication Sig Start Date End Date Taking? Authorizing Provider  acetaminophen (TYLENOL) 500 MG tablet Take 1,000 mg by mouth every 6 (six) hours as needed for mild pain, moderate pain, fever or headache.    [provider]  clindamycin  (CLEOCIN) 150 MG capsule Take 1 capsule (150 mg total) by mouth every 6 (six) hours. 10/13/16   Ivery Quale, PA-C  cyclobenzaprine (FLEXERIL) 10 MG tablet Take 1 tablet (10 mg total) by mouth 3 (three) times daily. 10/13/16   Ivery Quale, PA-C  diclofenac (VOLTAREN) 75 MG EC tablet Take 1 tablet (75 mg total) by mouth 2 (two) times daily. 10/13/16   Ivery Quale, PA-C  ibuprofen (ADVIL,MOTRIN) 800 MG tablet Take 1 tablet (800 mg total) by mouth 3 (three) times daily. 10/03/16   Eber Hong, MD  loratadine-pseudoephedrine (CLARITIN-D 12 HOUR) 5-120 MG tablet Take 1 tablet by mouth 2 (two) times daily. 10/13/16   Ivery Quale, PA-C  oxyCODONE-acetaminophen (PERCOCET/ROXICET) 5-325 MG tablet Take 2 tablets by mouth every 6 (six) hours as needed for severe pain. 10/21/16   Lilybelle Mayeda, PA-C  promethazine (PHENERGAN) 25 MG tablet Take 1 tablet (25 mg total) by mouth every 6 (six) hours as needed for nausea or vomiting. 10/03/16   Eber Hong, MD  tamsulosin (FLOMAX) 0.4 MG CAPS capsule Take 1 capsule (0.4 mg total) by mouth 2 (two) times daily. 10/03/16   Eber Hong, MD    Family History Family History  Problem Relation Age of Onset  . Alcohol abuse Father   . Drug abuse Father     Social History  Social History  Substance Use Topics  . Smoking status: Current Every Day Smoker    Packs/day: 1.00    Years: 10.00    Types: Cigarettes  . Smokeless tobacco: Former Neurosurgeon    Types: Snuff  . Alcohol use Yes     Comment: occ     Allergies   Adhesive [tape]; Amoxicillin; Codeine; Penicillins; and Latex   Review of Systems Review of Systems  Constitutional: Positive for appetite change. Negative for chills and fever.  HENT: Negative for ear pain, rhinorrhea, sneezing and sore throat.   Eyes: Negative for photophobia and visual disturbance.  Respiratory: Positive for shortness of breath. Negative for cough, chest tightness and wheezing.   Cardiovascular: Negative for chest pain and  palpitations.  Gastrointestinal: Positive for diarrhea and nausea. Negative for abdominal pain, blood in stool, constipation and vomiting.  Genitourinary: Negative for decreased urine volume, dysuria, hematuria, penile swelling and urgency.  Musculoskeletal: Negative for back pain, gait problem and myalgias.  Skin: Negative for rash.  Neurological: Positive for syncope. Negative for dizziness, weakness, light-headedness and headaches.     Physical Exam Updated Vital Signs BP (!) 153/101 (BP Location: Right Arm)   Pulse 86   Temp 98.8 F (37.1 C) (Oral)   Resp 19   Ht 5\' 5"  (1.651 m)   Wt 92.5 kg (204 lb)   SpO2 99%   BMI 33.95 kg/m   Physical Exam  Constitutional: He is oriented to person, place, and time. He appears well-developed and well-nourished. No distress.  HENT:  Head: Normocephalic and atraumatic.  Nose: Nose normal.  Mouth/Throat: Mucous membranes are normal.  Eyes: Conjunctivae and EOM are normal. Pupils are equal, round, and reactive to light. Right eye exhibits no discharge. Left eye exhibits no discharge. No scleral icterus.  Neck: Normal range of motion. Neck supple.  Cardiovascular: Normal rate, regular rhythm, normal heart sounds and intact distal pulses.  Exam reveals no gallop and no friction rub.   No murmur heard. Pulmonary/Chest: Effort normal and breath sounds normal. No respiratory distress.  Abdominal: Soft. Bowel sounds are normal. He exhibits no distension. There is no tenderness. There is no guarding.  Musculoskeletal: Normal range of motion. He exhibits no edema.  Strength 5/5 in bilateral upper and lower extremities. Equal grip strength noted. Sensation intact to light touch in bilateral upper and lower extremities.  Neurological: He is alert and oriented to person, place, and time. No cranial nerve deficit or sensory deficit. He exhibits normal muscle tone. Coordination normal.  Patient is alert and oriented 3. No signs of head injury or palpable  hematoma noted. Normal finger to nose coordination. No pronator drift noted. Cranial nerves appear grossly intact. Pupils reactive.  Skin: Skin is warm and dry. No rash noted.  Psychiatric: He has a normal mood and affect.  Nursing note and vitals reviewed.    ED Treatments / Results  Labs (all labs ordered are listed, but only abnormal results are displayed) Labs Reviewed  CBC WITH DIFFERENTIAL/PLATELET - Abnormal; Notable for the following:       Result Value   WBC 14.0 (*)    Platelets 144 (*)    Neutro Abs 10.5 (*)    Monocytes Absolute 1.2 (*)    All other components within normal limits  BASIC METABOLIC PANEL - Abnormal; Notable for the following:    Potassium 3.4 (*)    Glucose, Bld 103 (*)    Creatinine, Ser 2.03 (*)    GFR calc non Af Denyse Dago  43 (*)    GFR calc Af Amer 50 (*)    All other components within normal limits  URINALYSIS, ROUTINE W REFLEX MICROSCOPIC - Abnormal; Notable for the following:    Hgb urine dipstick MODERATE (*)    All other components within normal limits  D-DIMER, QUANTITATIVE (NOT AT [facility])    EKG  EKG Interpretation  Date/Time:  Wednesday October 20 2016 21:50:05 EDT Ventricular Rate:  112 PR Interval:    QRS Duration: 87 QT Interval:  319 QTC Calculation: 436 R Axis:   34 Text Interpretation:  Sinus tachycardia ST elev, probable normal early repol pattern Confirmed by Ross Marcus ([id number]) on 10/21/2016 12:38:36 AM       Radiology Dg Ribs Unilateral W/chest Left  Result Date: 10/21/2016 CLINICAL DATA:  Acute onset of anterior left lower rib pain after syncope and fall. Initial encounter. EXAM: LEFT RIBS AND CHEST - 3+ VIEW COMPARISON:  Chest radiograph performed 07/14/2016 FINDINGS: There is suspicion of a minimally displaced fracture of the left lateral ninth rib. The lungs are well-aerated and clear. There is no evidence of focal opacification, pleural effusion or pneumothorax. The cardiomediastinal silhouette is within normal limits.  No additional osseous abnormalities are seen. IMPRESSION: Suspect minimally displaced fracture of the left lateral ninth rib. Lungs remain grossly clear. Electronically Signed   By: Roanna Raider M.D.   On: 10/21/2016 00:10   Ct Renal Stone Study  Result Date: 10/19/2016 CLINICAL DATA:  26 year old male with right flank pain since last night. History kidney stones. Initial encounter. EXAM: CT ABDOMEN AND PELVIS WITHOUT CONTRAST TECHNIQUE: Multidetector CT imaging of the abdomen and pelvis was performed following the standard protocol without IV contrast. COMPARISON:  10/03/2016 and 07/04/2014. FINDINGS: Lower chest: Basilar subsegmental atelectasis. Right lower lobe pulmonary nodules not as well delineated as on the recent exam. Hepatobiliary: Taking into account limitation by non contrast imaging, no worrisome hepatic lesion. Gallbladder sludge suspected without calcified gallstone. Pancreas: Taking into account limitation by non contrast imaging, no mass or inflammation. Spleen: Taking into account limitation by non contrast imaging, no mass or enlargement. Adrenals/Urinary Tract: Recently noted (10/03/2016) 4 mm mid right ureteral stone has traversed into the region of the right ureterovesical junction and is causing moderate to marked right hydroureteronephrosis with extravasation of urine. Taking into account limitation by non contrast imaging, no worrisome adrenal or renal mass. No obvious bladder mass. Stomach/Bowel: No extraluminal bowel inflammatory process or free air. Specifically, no inflammation surrounds the appendix or terminal ileum. Vascular/Lymphatic: No aneurysm or adenopathy. Reproductive: Slight haziness adjacent to right seminal vesicle felt to be related to right renal collecting system obstruction. Other: No bowel containing hernia. Musculoskeletal: No acute osseous abnormality. IMPRESSION: Recently noted (10/03/2016) 4 mm mid right ureteral stone has traversed into the region of the  right ureterovesical junction and is causing moderate to marked right hydroureteronephrosis with extravasation of urine. Electronically Signed   By: Lacy Duverney M.D.   On: 10/19/2016 07:41    Procedures Procedures (including critical care time)  Medications Ordered in ED Medications  sodium chloride 0.9 % bolus 1,000 mL (0 mLs Intravenous Stopped 10/21/16 0101)  sodium chloride 0.9 % bolus 1,000 mL (1,000 mLs Intravenous New Bag/Given 10/21/16 0108)     Initial Impression / Assessment and Plan / ED Course  I have reviewed the triage vital signs and the nursing notes.  Pertinent labs & imaging results that were available during my care of the patient were reviewed by me and considered in  my medical decision making (see chart for details).     Patient presents to ED for several episodes of loss of consciousness that occurred prior to arrival. Patient states that he felt hot but was unable to check his temperature before his first episode of loss of consciousness. He states that he has decreased appetite due to his pain from his kidney stones and has been eating and drinking less. Denies any head injury or headache. Neurological exam revealed no focal deficits. No abdominal pain present. Patient is satting at 99%. Patient complains of left sided rib pain. X-ray was obtained and showed possible minimally displaced fracture of the left lateral ninth rib. CBC reveals leukocytosis at 14.0. This appears to be downtrending based on yesterday's value of 17.5. BMP revealed a bump in creatinine 2.03. This is a change from yesterday's value 1.76. BUN decreased this 20 today compared to 24 yesterday. GFR 43. Urinalysis showed hematuria which patient notes he has a history of due to his kidney stones. D-dimer negative. EKG showed sinus tachycardia. Due to patient's multiple episodes of syncope as well as his acute kidney injury we gave him 2 L of fluids, as well as pain control for his rib fracture. Spoke to  Dr. Ranae PalmsYelverton who stated patient should be admitted for his acute kidney injury as well as his syncope. I spoke to the patient he states that he refuses to be admitted at this time. He states that if he gets admitted to the hospital he could potentially lose his job. I advised him that keeping him admitted in the hospital will help with improving his kidney function as well as monitoring to prevent any future episodes of syncope. Patient still refused to be admitted at this time. He does appear stable and his symptoms could be due to dehydration from decreased fluid intake and diarrhea as well as heat exhaustion. Patient was given pain control here in the ED with fluids. We will discharge home with pain medication for rib fracture, encouraged him to increase fluid intake and close follow-up with PCP as soon as possible for further evaluation. Patient states allergy to codeine syrup which he was told since teen years but has taken oxycodone in the past with no reaction or other problems. Strict return precautions given.  Final Clinical Impressions(s) / ED Diagnoses   Final diagnoses:  Near syncope  AKI (acute kidney injury) (HCC)  Dehydration  Closed fracture of one rib of left side, initial encounter    New Prescriptions New Prescriptions   OXYCODONE-ACETAMINOPHEN (PERCOCET/ROXICET) 5-325 MG TABLET    Take 2 tablets by mouth every 6 (six) hours as needed for severe pain.       Dietrich PatesKhatri, Justis Dupas, PA-C 10/21/16 0138    Loren RacerYelverton, David, MD 10/26/16 [id number]

## 2016-10-21 LAB — URINALYSIS, ROUTINE W REFLEX MICROSCOPIC
Bacteria, UA: NONE SEEN
Bilirubin Urine: NEGATIVE
Glucose, UA: NEGATIVE mg/dL
Ketones, ur: NEGATIVE mg/dL
Leukocytes, UA: NEGATIVE
Nitrite: NEGATIVE
Protein, ur: NEGATIVE mg/dL
SPECIFIC GRAVITY, URINE: 1.021 (ref 1.005–1.030)
SQUAMOUS EPITHELIAL / LPF: NONE SEEN
pH: 5 (ref 5.0–8.0)

## 2016-10-21 LAB — D-DIMER, QUANTITATIVE: D-Dimer, Quant: 0.36 ug/mL-FEU (ref 0.00–0.50)

## 2016-10-21 MED ORDER — OXYCODONE-ACETAMINOPHEN 5-325 MG PO TABS
1.0000 | ORAL_TABLET | Freq: Once | ORAL | Status: AC
  Administered 2016-10-21: 1 via ORAL
  Filled 2016-10-21: qty 1

## 2016-10-21 MED ORDER — OXYCODONE-ACETAMINOPHEN 5-325 MG PO TABS: 2.0000 | ORAL_TABLET | Freq: Four times a day (QID) | ORAL | 0 refills | Status: DC | PRN

## 2016-10-21 MED ORDER — SODIUM CHLORIDE 0.9 % IV BOLUS (SEPSIS)
1000.0000 mL | Freq: Once | INTRAVENOUS | Status: AC
Start: 2016-10-21 — End: 2016-10-21
  Administered 2016-10-21: 1000 mL via INTRAVENOUS

## 2016-10-21 NOTE — ED Notes (Signed)
Pt still receiving liter bolus, update given to pt and family

## 2016-10-21 NOTE — ED Notes (Signed)
Pt and family updated on plan of care,  

## 2016-10-21 NOTE — ED Notes (Signed)
ED Provider at bedside. 

## 2016-10-21 NOTE — Discharge Instructions (Signed)
Please ensure adequate fluid and food intake. Take pain medication for rib pain. Follow-up with PCP for further evaluation as soon as possible. Return to ED for injury, loss of consciousness, head injury, vision changes, urinary symptoms, fever, severe abdominal pain.

## 2016-10-21 NOTE — ED Notes (Signed)
Pt updated on plan of care,  

## 2016-11-11 ENCOUNTER — Emergency Department (HOSPITAL_COMMUNITY)
Admission: EM | Admit: 2016-11-11 | Discharge: 2016-11-11 | Disposition: A | Discharge status: Home / Self Care | Payer: Self-pay | Attending: Emergency Medicine | Admitting: Emergency Medicine

## 2016-11-11 ENCOUNTER — Encounter (HOSPITAL_COMMUNITY): Payer: Self-pay | Admitting: *Deleted

## 2016-11-11 DIAGNOSIS — Z91040 Latex allergy status: Secondary | ICD-10-CM | POA: Insufficient documentation

## 2016-11-11 DIAGNOSIS — Y999 Unspecified external cause status: Secondary | ICD-10-CM | POA: Insufficient documentation

## 2016-11-11 DIAGNOSIS — F17210 Nicotine dependence, cigarettes, uncomplicated: Secondary | ICD-10-CM | POA: Insufficient documentation

## 2016-11-11 DIAGNOSIS — Z79899 Other long term (current) drug therapy: Secondary | ICD-10-CM | POA: Insufficient documentation

## 2016-11-11 DIAGNOSIS — S20219A Contusion of unspecified front wall of thorax, initial encounter: Secondary | ICD-10-CM | POA: Insufficient documentation

## 2016-11-11 DIAGNOSIS — Y939 Activity, unspecified: Secondary | ICD-10-CM | POA: Insufficient documentation

## 2016-11-11 DIAGNOSIS — Y929 Unspecified place or not applicable: Secondary | ICD-10-CM | POA: Insufficient documentation

## 2016-11-11 MED ORDER — IBUPROFEN 400 MG PO TABS
600.0000 mg | ORAL_TABLET | Freq: Once | ORAL | Status: AC
  Administered 2016-11-11: 600 mg via ORAL
  Filled 2016-11-11: qty 2
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
## 2016-11-15 ENCOUNTER — Other Ambulatory Visit: Payer: Self-pay | Admitting: *Deleted

## 2016-11-15 NOTE — Patient Outreach (Signed)
[facility] Care Management  11/15/2016  Charles BlazingJoshua D [name] Aug 29, 1990 [id number]   Subjective: Telephone call to patient's home number, message states invalid number, unable to leave message.   Objective: Per chart review, patient had ED visits on the following dates:11/11/16 for chest contusion, 10/20/16 for near syncope, 10/19/16 for abdominal pain, 10/18/16 for right flank pain, 10/13/16 for neck pain, 10/04/16 for abdominal pain, 10/03/16 for abdominal pain,  09/12/16 for knee pain, 08/28/16 for abscess, 07/29/16 for right hand contusion, 07/17/16 for status post fall with right hand contusion, 07/14/16 for atypical chest pain, 07/08/16 for head injury, and 04/10/16 for rib contusion.  [facility] completed ED follow up call on 08/31/16.    Assessment: [facility] on 11/12/16. Referral states patient has had 6 or more ED visits in the past 6 months. ED follow up attempt, per previous conversation with patient he no longer has [employer] insurance, and will proceed with case closure due to ineligible payer.    Plan: RNCM will send case closure request due to patient being ineligible for services, noneligible payer, to Iverson AlaminLaura Greeson at [facility].    Ermina Oberman H. Gardiner Barefootooper RN, BSN, CCM [facility] Care [facility] Telephonic CM Phone: [phone] Fax: [phone]
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
## 2016-12-07 ENCOUNTER — Emergency Department (HOSPITAL_COMMUNITY): Payer: No Typology Code available for payment source

## 2016-12-07 ENCOUNTER — Encounter (HOSPITAL_COMMUNITY): Payer: Self-pay | Admitting: *Deleted

## 2016-12-07 ENCOUNTER — Emergency Department (HOSPITAL_COMMUNITY)
Admission: EM | Admit: 2016-12-07 | Discharge: 2016-12-07 | Disposition: A | Discharge status: Home / Self Care | Payer: No Typology Code available for payment source | Attending: Emergency Medicine | Admitting: Emergency Medicine

## 2016-12-07 DIAGNOSIS — R079 Chest pain, unspecified: Secondary | ICD-10-CM | POA: Diagnosis present

## 2016-12-07 DIAGNOSIS — Z79899 Other long term (current) drug therapy: Secondary | ICD-10-CM | POA: Diagnosis not present

## 2016-12-07 DIAGNOSIS — R0789 Other chest pain: Secondary | ICD-10-CM | POA: Diagnosis not present

## 2016-12-07 DIAGNOSIS — Z91040 Latex allergy status: Secondary | ICD-10-CM | POA: Insufficient documentation

## 2016-12-07 DIAGNOSIS — F17210 Nicotine dependence, cigarettes, uncomplicated: Secondary | ICD-10-CM | POA: Diagnosis not present

## 2016-12-07 MED ORDER — IBUPROFEN 600 MG PO TABS: 600.0000 mg | ORAL_TABLET | Freq: Four times a day (QID) | ORAL | 0 refills | Status: DC | PRN

## 2016-12-07 MED ORDER — METHOCARBAMOL 500 MG PO TABS: 500.0000 mg | ORAL_TABLET | Freq: Two times a day (BID) | ORAL | 0 refills | Status: DC

## 2016-12-07 NOTE — ED Triage Notes (Signed)
Pt adds he is having neck pain.

## 2016-12-07 NOTE — ED Notes (Signed)
C-collar applied in triage.
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
## 2016-12-07 NOTE — Discharge Instructions (Signed)
See your Physician for recheck if pain persist ?
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
## 2016-12-07 NOTE — ED Triage Notes (Signed)
Pt placed in c-collar. On and aligned.  

## 2017-02-15 ENCOUNTER — Emergency Department (HOSPITAL_COMMUNITY)
Admission: EM | Admit: 2017-02-15 | Discharge: 2017-02-15 | Disposition: A | Discharge status: Home / Self Care | Payer: Self-pay | Attending: Emergency Medicine | Admitting: Emergency Medicine

## 2017-02-15 ENCOUNTER — Encounter (HOSPITAL_COMMUNITY): Payer: Self-pay | Admitting: *Deleted

## 2017-02-15 DIAGNOSIS — Y33XXXA Other specified events, undetermined intent, initial encounter: Secondary | ICD-10-CM | POA: Insufficient documentation

## 2017-02-15 DIAGNOSIS — Z91040 Latex allergy status: Secondary | ICD-10-CM | POA: Insufficient documentation

## 2017-02-15 DIAGNOSIS — S61412A Laceration without foreign body of left hand, initial encounter: Secondary | ICD-10-CM | POA: Insufficient documentation

## 2017-02-15 DIAGNOSIS — Y939 Activity, unspecified: Secondary | ICD-10-CM | POA: Insufficient documentation

## 2017-02-15 DIAGNOSIS — F17210 Nicotine dependence, cigarettes, uncomplicated: Secondary | ICD-10-CM | POA: Insufficient documentation

## 2017-02-15 DIAGNOSIS — Z23 Encounter for immunization: Secondary | ICD-10-CM | POA: Insufficient documentation

## 2017-02-15 DIAGNOSIS — Y999 Unspecified external cause status: Secondary | ICD-10-CM | POA: Insufficient documentation

## 2017-02-15 DIAGNOSIS — W268XXA Contact with other sharp object(s), not elsewhere classified, initial encounter: Secondary | ICD-10-CM | POA: Insufficient documentation

## 2017-02-15 DIAGNOSIS — Y929 Unspecified place or not applicable: Secondary | ICD-10-CM | POA: Insufficient documentation

## 2017-02-15 MED ORDER — POVIDONE-IODINE 10 % EX SOLN
CUTANEOUS | Status: AC
  Administered 2017-02-15: 04:00:00
  Filled 2017-02-15: qty 45

## 2017-02-15 MED ORDER — LIDOCAINE HCL (PF) 1 % IJ SOLN
5.0000 mL | Freq: Once | INTRAMUSCULAR | Status: AC
  Administered 2017-02-15: 5 mL
  Filled 2017-02-15: qty 6

## 2017-02-15 MED ORDER — TETANUS-DIPHTH-ACELL PERTUSSIS 5-2.5-18.5 LF-MCG/0.5 IM SUSP
0.5000 mL | Freq: Once | INTRAMUSCULAR | Status: AC
  Administered 2017-02-15: 0.5 mL via INTRAMUSCULAR
  Filled 2017-02-15: qty 0.5

## 2017-02-15 MED ORDER — POVIDONE-IODINE 10 % EX SOLN
CUTANEOUS | Status: AC
  Administered 2017-02-15: 19:00:00
  Filled 2017-02-15: qty 15

## 2017-02-15 NOTE — ED Provider Notes (Signed)
[facility] EMERGENCY DEPARTMENT Provider Note   CSN: [id number] Arrival date & time: 02/15/17  1718     History   Chief Complaint Chief Complaint  Patient presents with  . Follow-up    HPI Charles [name] is a 26 y.o. male.  Patient is a 26 year old male presents to the emergency department because his sutures came out of place.  Patient was seen approximately 4 AM this morning and had a laceration repaired. He states that later during the day the sutures began to come out. He presents now to get this redone.he's not had any unusual drainage or bleeding. He's had no signs of infection.      Past Medical History:  Diagnosis Date  . Back pain   . Rib pain     There are no active problems to display for this patient.   Past Surgical History:  Procedure Laterality Date  . ADENOIDECTOMY    . TONSILLECTOMY         Home Medications    Prior to Admission medications   Not on File    Family History Family History  Problem Relation Age of Onset  . Alcohol abuse Father   . Drug abuse Father     Social History Social History  Substance Use Topics  . Smoking status: Current Every Day Smoker    Packs/day: 1.00    Years: 10.00    Types: Cigarettes  . Smokeless tobacco: Former Neurosurgeon    Types: Snuff  . Alcohol use Yes     Comment: occ     Allergies   Adhesive [tape]; Amoxicillin; Codeine; Penicillins; and Latex   Review of Systems Review of Systems  Constitutional: Negative for activity change and appetite change.       All ROS Neg except as noted in HPI  HENT: Negative for congestion, ear discharge, ear pain, facial swelling, nosebleeds, rhinorrhea, sneezing and tinnitus.   Eyes: Negative for photophobia, pain and discharge.  Respiratory: Negative for cough, choking, shortness of breath and wheezing.   Cardiovascular: Negative for chest pain, palpitations and leg swelling.  Gastrointestinal: Negative for abdominal pain, blood in stool, constipation,  diarrhea, nausea and vomiting.  Genitourinary: Negative for difficulty urinating, dysuria, flank pain, frequency and hematuria.  Musculoskeletal: Negative for arthralgias, back pain, gait problem, myalgias and neck pain.  Skin: Negative.  Negative for color change, rash and wound.  Neurological: Negative for dizziness, seizures, syncope, facial asymmetry, speech difficulty, weakness and numbness.  Hematological: Negative for adenopathy. Does not bruise/bleed easily.  Psychiatric/Behavioral: Negative for agitation, confusion, hallucinations, self-injury and suicidal ideas. The patient is not nervous/anxious.      Physical Exam Updated Vital Signs BP 120/75   Pulse 70   Temp 98.1 F (36.7 C) (Oral)   Resp 17   Ht  (1.651 m)   Wt 95.3 kg (210 lb)   SpO2 99%   BMI 34.95 kg/m   Physical Exam  Constitutional: He is oriented to person, place, and time. He appears well-developed and well-nourished.  Non-toxic appearance.  HENT:  Head: Normocephalic.  Right Ear: Tympanic membrane and external ear normal.  Left Ear: Tympanic membrane and external ear normal.  Eyes: Pupils are equal, round, and reactive to light. EOM and lids are normal.  Neck: Normal range of motion. Neck supple. Carotid bruit is not present.  Cardiovascular: Normal rate, regular rhythm, normal heart sounds, intact distal pulses and normal pulses.   Pulmonary/Chest: Breath sounds normal. No respiratory distress.  Abdominal: Soft. Bowel  sounds are normal. There is no tenderness. There is no guarding.  Musculoskeletal: Normal range of motion.  There is a 1.4 cm laceration to the palmar surface of the left hand. 2 sutures are coming unraveled. One area of the laceration is gaping open. The patient has full range of motion of all fingers. Capillary refill is less than 2 seconds. Radial pulses 2+.  Lymphadenopathy:       Head (right side): No submandibular adenopathy present.       Head (left side): No submandibular  adenopathy present.    He has no cervical adenopathy.  Neurological: He is alert and oriented to person, place, and time. He has normal strength. No cranial nerve deficit or sensory deficit.  Skin: Skin is warm and dry.  Psychiatric: He has a normal mood and affect. His speech is normal.  Nursing note and vitals reviewed.    ED Treatments / Results  Labs (all labs ordered are listed, but only abnormal results are displayed) Labs Reviewed - No data to display  EKG  EKG Interpretation None       Radiology No results found.  Procedures .Marland KitchenLaceration Repair Date/Time: 02/15/2017 7:35 PM Performed by: Ivery Quale Authorized by: Ivery Quale   Consent:    Consent obtained:  Verbal   Consent given by:  Patient   Risks discussed:  Infection, pain, poor cosmetic result and poor wound healing Anesthesia (see MAR for exact dosages):    Anesthesia method:  Local infiltration   Local anesthetic:  Lidocaine 1% w/o epi Laceration details:    Location:  Hand   Hand location:  L palm   Length (cm):  1.4 Repair type:    Repair type:  Simple Pre-procedure details:    Preparation:  Patient was prepped and draped in usual sterile fashion Exploration:    Wound exploration: wound explored through full range of motion     Wound extent: no foreign bodies/material noted, no nerve damage noted and no tendon damage noted   Treatment:    Area cleansed with:  Betadine   Amount of cleaning:  Standard   Irrigation solution:  Tap water Skin repair:    Repair method:  Sutures   Suture size:  4-0   Suture material:  Nylon   Number of sutures:  5 Approximation:    Approximation:  Close Post-procedure details:    Dressing:  Sterile dressing   Patient tolerance of procedure:  Tolerated well, no immediate complications   (including critical care time)  Medications Ordered in ED Medications  lidocaine (PF) (XYLOCAINE) 1 % injection 5 mL (5 mLs Other Given by Other 02/15/17 1920)    povidone-iodine (BETADINE) 10 % external solution (  Given 02/15/17 1920)     Initial Impression / Assessment and Plan / ED Course  I have reviewed the triage vital signs and the nursing notes.  Pertinent labs & imaging results that were available during my care of the patient were reviewed by me and considered in my medical decision making (see chart for details).       Final Clinical Impressions(s) / ED Diagnoses MDM Vital signs within normal limits. There is no evidence of infection at this time at the laceration site. The wound was reclosedusing 5 interrupted sutures of 4-0 nylon. I discussed with the patient the importance of keeping the wound clean and dry. We also discussed the need to return if any signs of infection. Questions were answered. Patient is in agreement with this plan.  Final diagnoses:  Laceration of left hand without foreign body, initial encounter    New Prescriptions New Prescriptions   No medications on file     Ivery Quale, Cordelia Poche 02/15/17 1939    Lavera Guise, MD 02/16/17 [phone]

## 2017-02-15 NOTE — ED Triage Notes (Signed)
Pt c/o stitches coming out of his hand after they were placed this morning around 0400.

## 2017-02-15 NOTE — ED Triage Notes (Signed)
Pt states he was at work and cut his left palm on a piece of metal; pt staets it occurred 2300 tonight

## 2017-02-15 NOTE — Discharge Instructions (Signed)
Please keep the wound bandaged. Please return to the emergency department or see her primary physician if any signs of advancing infection. Please have your sutures removed in 7 days.

## 2017-02-15 NOTE — ED Provider Notes (Signed)
[facility] EMERGENCY DEPARTMENT Provider Note   CSN: [id number] Arrival date & time: 02/15/17  0128     History   Chief Complaint Chief Complaint  Patient presents with  . Laceration    HPI Charles [name] is a 26 y.o. male.  The history is provided by the patient.  Laceration   The incident occurred 3 to 5 hours ago. The laceration is located on the left hand. The laceration is 1 cm in size. The laceration mechanism was a a metal edge. The pain is mild. The pain has been constant since onset. He reports no foreign bodies present. His tetanus status is unknown.  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 Back pain   . Rib pain     There are no active problems to display for this patient.   Past Surgical History:  Procedure Laterality Date  . ADENOIDECTOMY    . TONSILLECTOMY         Home Medications    Prior to Admission medications   Not on File    Family History Family History  Problem Relation Age of Onset  . Alcohol abuse Father   . Drug abuse Father     Social History Social History  Substance Use Topics  . Smoking status: Current Every Day Smoker    Packs/day: 1.00    Years: 10.00    Types: Cigarettes  . Smokeless tobacco: Former Neurosurgeon    Types: Snuff  . Alcohol use Yes     Comment: occ     Allergies   Adhesive [tape]; Amoxicillin; Codeine; Penicillins; and Latex   Review of Systems Review of Systems  Skin: Positive for wound.  Neurological: Negative for weakness.     Physical Exam Updated Vital Signs BP (!) 143/82 (BP Location: Right Arm)   Pulse 90   Temp 98.1 F (36.7 C) (Oral)   Resp 18   Ht 1.651 m ( )   Wt 95.3 kg (210 lb)   SpO2 98%   BMI 34.95 kg/m   Physical Exam CONSTITUTIONAL: Disheveled, no acute distress HEAD: Normocephalic/atraumatic EYES: EOMI ENMT: Mucous  membranes moist NECK: supple no meningeal signs LUNGS:   no apparent distress ABDOMEN: soft  NEURO: Pt is awake/alert/appropriate, moves all extremitiesx4.  No facial droop.   EXTREMITIES: pulses normal/equal, full ROM He has appropriate/equal flexion of all fingers on left hand without any motor deficits   SKIN: warm, color normal, 1cm horizontal laceration on palmar surface just distal to wrist near ulnar surface.  No active bleed.  No foreign body noted.  No deep structures (bone/tendon) exposed PSYCH: no abnormalities of mood noted, alert and oriented to situation   ED Treatments / Results  Labs (all labs ordered are listed, but only abnormal results are displayed) Labs Reviewed - No data to display  EKG  EKG Interpretation None       Radiology No results found.  Procedures Procedures  LACERATION REPAIR Performed by: Joya Gaskins Consent: Verbal consent obtained. Risks and benefits: risks, benefits and alternatives were discussed Patient identity confirmed: provided demographic data Time out performed prior to procedure Prepped and Draped in normal sterile fashion Wound explored Laceration Location: left hand Laceration Length: 1cm No Foreign Bodies seen or palpated Anesthesia: local infiltration Local anesthetic: lidocaine % without epinephrine Anesthetic total: 3 ml Amount of cleaning:  standard/soaked hand in betadine then rinsed under tap water Skin closure: simple Number of sutures or staples: 2 ethilon Technique: interrupted Patient tolerance: Patient tolerated the procedure well with no immediate complications.   Medications Ordered in ED Medications  Tdap (BOOSTRIX) injection 0.5 mL (0.5 mLs Intramuscular Given 02/15/17 0328)  lidocaine (PF) (XYLOCAINE) 1 % injection 5 mL (5 mLs Other Given by Other 02/15/17 0332)  povidone-iodine (BETADINE) 10 % external solution (  Given 02/15/17 0333)     Initial Impression / Assessment and Plan / ED Course    I have reviewed the triage vital signs and the nursing notes.      Pt stable No foreign bodies No evidence of tendon injury Appropriate for d/c home  Final Clinical Impressions(s) / ED Diagnoses   Final diagnoses:  Laceration of left hand without foreign body, initial encounter    New Prescriptions Current Discharge Medication List       Zadie Rhine, MD 02/15/17 [phone]

## 2017-04-14 ENCOUNTER — Telehealth: Payer: Self-pay

## 2017-04-14 NOTE — Telephone Encounter (Signed)
Patient is on the list of Top ED visitors. Pt was called to the number provided [phone]. No answer. Message was left stating my name and reason for my call and number to reach me at.   Will attempt to call pt again at another date and time.    Melissia Lahman R. [place], [place] [phone]

## 2017-05-10 ENCOUNTER — Encounter (HOSPITAL_COMMUNITY): Payer: Self-pay | Admitting: *Deleted

## 2017-05-10 ENCOUNTER — Other Ambulatory Visit: Payer: Self-pay

## 2017-05-10 ENCOUNTER — Emergency Department (HOSPITAL_COMMUNITY)
Admission: EM | Admit: 2017-05-10 | Discharge: 2017-05-10 | Disposition: A | Discharge status: Home / Self Care | Payer: Self-pay | Attending: Emergency Medicine | Admitting: Emergency Medicine

## 2017-05-10 DIAGNOSIS — H1032 Unspecified acute conjunctivitis, left eye: Secondary | ICD-10-CM | POA: Insufficient documentation

## 2017-05-10 DIAGNOSIS — F17210 Nicotine dependence, cigarettes, uncomplicated: Secondary | ICD-10-CM | POA: Insufficient documentation

## 2017-05-10 MED ORDER — FLUORESCEIN SODIUM 1 MG OP STRP
ORAL_STRIP | OPHTHALMIC | Status: AC
  Filled 2017-05-10: qty 1

## 2017-05-10 MED ORDER — TETRACAINE HCL 0.5 % OP SOLN
OPHTHALMIC | Status: AC
  Filled 2017-05-10: qty 4

## 2017-05-10 MED ORDER — POLYMYXIN B-TRIMETHOPRIM 10000-0.1 UNIT/ML-% OP SOLN: 2.0000 [drp] | OPHTHALMIC | 0 refills | Status: DC

## 2017-05-10 NOTE — Discharge Instructions (Signed)
Make sure to wash hands diligently.

## 2017-05-10 NOTE — ED Triage Notes (Signed)
Pt c/o white discharge and pain to left eye that started today

## 2017-05-10 NOTE — ED Provider Notes (Signed)
[facility] EMERGENCY DEPARTMENT Provider Note   CSN: [id number] Arrival date & time: 05/10/17  0038     History   Chief Complaint Chief Complaint  Patient presents with  . Eye Pain    HPI Charles [name] is a 27 y.o. male.  HPI  This is a 27 year old male who presents with left eye pain and drainage.  Onset of symptoms yesterday morning.  Reports some congestion but otherwise no fevers or upper respiratory symptoms.  Does have a cousin with similar symptoms.  Has noted some blurry vision of the left eye.  No pain or foreign body sensation.  No injury noted.  Past Medical History:  Diagnosis Date  . Back pain   . Rib pain     There are no active problems to display for this patient.   Past Surgical History:  Procedure Laterality Date  . ADENOIDECTOMY    . TONSILLECTOMY         Home Medications    Prior to Admission medications   Medication Sig Start Date End Date Taking? Authorizing Provider  trimethoprim-polymyxin b (POLYTRIM) ophthalmic solution Place 2 drops into the left eye every 4 (four) hours. 05/10/17   Lenzi Marmo, Mayer Maskerourtney F, MD    Family History Family History  Problem Relation Age of Onset  . Alcohol abuse Father   . Drug abuse Father     Social History Social History   Tobacco Use  . Smoking status: Current Every Day Smoker    Packs/day: 1.00    Years: 10.00    Pack years: 10.00    Types: Cigarettes  . Smokeless tobacco: Former NeurosurgeonUser    Types: Snuff  Substance Use Topics  . Alcohol use: Yes    Comment: occ  . Drug use: No     Allergies   Adhesive [tape]; Amoxicillin; Codeine; Penicillins; and Latex   Review of Systems Review of Systems  Constitutional: Negative for fever.  HENT: Positive for congestion.   Eyes: Positive for discharge, redness and visual disturbance.  Respiratory: Negative for cough.   All other systems reviewed and are negative.    Physical Exam Updated Vital Signs BP 137/81 (BP Location: Right Arm)   Pulse  82   Temp 98.1 F (36.7 C) (Oral)   Resp 18   Ht 5\' 5"  (1.651 m)   Wt 95.3 kg (210 lb)   SpO2 98%   BMI 34.95 kg/m   Physical Exam  Constitutional: He is oriented to person, place, and time. He appears well-developed and well-nourished.  HENT:  Head: Normocephalic and atraumatic.  Eyes: Pupils are equal, round, and reactive to light.  Pupils 5 mm reactive bilaterally, extraocular movements intact, conjunctival injection left eye with scleral redness, no fluoroscein uptake, no foreign body  Cardiovascular: Normal rate and regular rhythm.  Pulmonary/Chest: Effort normal. No respiratory distress.  Neurological: He is alert and oriented to person, place, and time.  Skin: Skin is warm and dry.  Psychiatric: He has a normal mood and affect.  Nursing note and vitals reviewed.    ED Treatments / Results  Labs (all labs ordered are listed, but only abnormal results are displayed) Labs Reviewed - No data to display  EKG  EKG Interpretation None       Radiology No results found.  Procedures Procedures (including critical care time)  Medications Ordered in ED Medications  fluorescein 1 MG ophthalmic strip (not administered)  tetracaine (PONTOCAINE) 0.5 % ophthalmic solution (not administered)     Initial Impression /  Assessment and Plan / ED Course  I have reviewed the triage vital signs and the nursing notes.  Pertinent labs & imaging results that were available during my care of the patient were reviewed by me and considered in my medical decision making (see chart for details).     Patient presents with left eye discharge.  History of physical exam consistent with conjunctivitis.  May be viral but cannot rule out bacterial.  Will give Polytrim.  Recommend good handwashing and hygiene practices.  Likely will have a self-limited course.  After history, exam, and medical workup I feel the patient has been appropriately medically screened and is safe for discharge home.  Pertinent diagnoses were discussed with the patient. Patient was given return precautions.   Final Clinical Impressions(s) / ED Diagnoses   Final diagnoses:  Acute conjunctivitis of left eye, unspecified acute conjunctivitis type    ED Discharge Orders        Ordered    trimethoprim-polymyxin b (POLYTRIM) ophthalmic solution  Every 4 hours     05/10/17 0134       Shon Baton, MD 05/10/17 [phone]

## 2017-05-12 ENCOUNTER — Other Ambulatory Visit: Payer: Self-pay

## 2017-05-12 ENCOUNTER — Telehealth: Payer: Self-pay

## 2017-05-12 ENCOUNTER — Encounter (HOSPITAL_COMMUNITY): Payer: Self-pay | Admitting: Emergency Medicine

## 2017-05-12 ENCOUNTER — Emergency Department (HOSPITAL_COMMUNITY)
Admission: EM | Admit: 2017-05-12 | Discharge: 2017-05-12 | Disposition: A | Discharge status: Home / Self Care | Payer: Medicaid Other | Attending: Emergency Medicine | Admitting: Emergency Medicine

## 2017-05-12 DIAGNOSIS — F17210 Nicotine dependence, cigarettes, uncomplicated: Secondary | ICD-10-CM | POA: Insufficient documentation

## 2017-05-12 DIAGNOSIS — Z91040 Latex allergy status: Secondary | ICD-10-CM | POA: Insufficient documentation

## 2017-05-12 DIAGNOSIS — H109 Unspecified conjunctivitis: Secondary | ICD-10-CM

## 2017-05-12 MED ORDER — TOBRAMYCIN 0.3 % OP SOLN
1.0000 [drp] | Freq: Once | OPHTHALMIC | Status: AC
  Administered 2017-05-12: 1 [drp] via OPHTHALMIC
  Filled 2017-05-12: qty 5

## 2017-05-12 MED ORDER — KETOROLAC TROMETHAMINE 0.5 % OP SOLN
1.0000 [drp] | Freq: Once | OPHTHALMIC | Status: AC
  Administered 2017-05-12: 1 [drp] via OPHTHALMIC
  Filled 2017-05-12: qty 5

## 2017-05-12 NOTE — Discharge Instructions (Signed)
Stop the polytrim drops.  Continue your zyrtec daily and apply warm wet compresses on off to your eyes.  Apply one drop of the acular to each eye 4 times a day for 5 days and one drop of the tobramycin to each eye every 4 hrs for 5 days.  Call the eye doctor listed to arrange a follow-up appt if not improving

## 2017-05-12 NOTE — Telephone Encounter (Signed)
Patient is on the list of Top ED visitors. Attempted to call pt for the second time to the number provided [phone]. Pt answered phone call, assured that he was in fact the pt, after stating who I was and assuring the pt that this was not a collection call, pt terminated the call.    Will attempt to call pt again at another date and time.    Kana Reimann R. [place], [place] [phone]

## 2017-05-12 NOTE — ED Triage Notes (Signed)
Redness and drainage to bilateral eyes since Monday

## 2017-05-13 NOTE — ED Provider Notes (Signed)
[facility]ERGENCY DEPARTMENT Provider Note   CSN: [id number] Arrival date & time: 05/12/17  1117     History   Chief Complaint Chief Complaint  Patient presents with  . Eye Drainage    HPI Charles [name] is a 27 y.o. male.  HPI   Charles [name] is a 27 y.o. male who presents to the Emergency Department complaining of persistent eye drainage, itching and swelling.  Symptoms began three days ago.  He was seen here two days ago for symptoms of the left eye and diagnosed with conjunctivitis.  He was prescribed Polytrim drops which he has been using, but states the symptoms are not improving and now he also has drainage, redness of the right eye as well.  He states that at times, his vision is clear, but other times it seems blurred.  Describes crusting in the mornings and using warm compresses to open his eyes.  He denies fever, chills, headache, facial pain or swelling.  He does not wear corrective lenses.     Past Medical History:  Diagnosis Date  . Back pain   . Rib pain     There are no active problems to display for this patient.   Past Surgical History:  Procedure Laterality Date  . ADENOIDECTOMY    . TONSILLECTOMY         Home Medications    Prior to Admission medications   Medication Sig Start Date End Date Taking? Authorizing Provider  trimethoprim-polymyxin b (POLYTRIM) ophthalmic solution Place 2 drops into the left eye every 4 (four) hours. 05/10/17   Horton, Mayer Maskerourtney F, MD    Family History Family History  Problem Relation Age of Onset  . Alcohol abuse Father   . Drug abuse Father     Social History Social History   Tobacco Use  . Smoking status: Current Every Day Smoker    Packs/day: 1.00    Years: 10.00    Pack years: 10.00    Types: Cigarettes  . Smokeless tobacco: Former NeurosurgeonUser    Types: Snuff  Substance Use Topics  . Alcohol use: Yes    Comment: occ  . Drug use: No     Allergies   Adhesive [tape]; Amoxicillin; Codeine;  Penicillins; and Latex   Review of Systems Review of Systems  Constitutional: Negative for activity change, chills and fever.  HENT: Positive for congestion. Negative for facial swelling, sore throat and trouble swallowing.   Eyes: Positive for pain, discharge and redness. Negative for itching.  Respiratory: Negative for cough and shortness of breath.   Cardiovascular: Negative for chest pain.  Gastrointestinal: Negative for abdominal pain, nausea and vomiting.  Musculoskeletal: Negative for arthralgias, neck pain and neck stiffness.  Skin: Negative for rash.  Neurological: Negative for dizziness and headaches.     Physical Exam Updated Vital Signs BP 123/83 (BP Location: Left Arm)   Pulse 72   Temp 98.1 F (36.7 C) (Oral)   Resp 15   Ht 5\' 5"  (1.651 m)   Wt 95.3 kg (210 lb)   SpO2 98%   BMI 34.95 kg/m   Physical Exam  Constitutional: He is oriented to person, place, and time. He appears well-developed and well-nourished. No distress.  HENT:  Head: Normocephalic and atraumatic.  Right Ear: Tympanic membrane and ear canal normal.  Left Ear: Tympanic membrane and ear canal normal.  Nose: Rhinorrhea present.  Mouth/Throat: Uvula is midline, oropharynx is clear and moist and mucous membranes are normal.  Eyes: EOM  are normal. Pupils are equal, round, and reactive to light. Lids are everted and swept, no foreign bodies found. Right eye exhibits exudate. Right eye exhibits no chemosis and no hordeolum. No foreign body present in the right eye. Left eye exhibits discharge and exudate. Left eye exhibits no chemosis and no hordeolum. No foreign body present in the left eye. Right conjunctiva is injected. Left conjunctiva is injected (left > right).  Mild edema of the bilateral upper lids.  Crusting at the left upper lid.    Neck: Normal range of motion. Neck supple. No thyromegaly present.  Cardiovascular: Normal rate, regular rhythm and intact distal pulses.  No murmur  heard. Pulmonary/Chest: Effort normal and breath sounds normal. No respiratory distress.  Musculoskeletal: Normal range of motion.  Lymphadenopathy:    He has no cervical adenopathy.  Neurological: He is alert and oriented to person, place, and time. He exhibits normal muscle tone. Coordination normal.  Skin: Skin is warm and dry. No rash noted.  Psychiatric: He has a normal mood and affect.  Nursing note and vitals reviewed.    ED Treatments / Results  Labs (all labs ordered are listed, but only abnormal results are displayed) Labs Reviewed - No data to display  EKG  EKG Interpretation None       Radiology No results found.  Procedures Procedures (including critical care time)  Medications Ordered in ED Medications  tobramycin (TOBREX) 0.3 % ophthalmic solution 1 drop (1 drop Both Eyes Given 05/12/17 1255)  ketorolac (ACULAR) 0.5 % ophthalmic solution 1 drop (1 drop Both Eyes Given 05/12/17 1255)     Initial Impression / Assessment and Plan / ED Course  I have reviewed the triage vital signs and the nursing notes.  Pertinent labs & imaging results that were available during my care of the patient were reviewed by me and considered in my medical decision making (see chart for details).     Pt with likely viral conjunctivitis although bacterial component may be possible given there is exudate present on the left. Symptoms now present on in the right eye as well  Has been using Polytrim drops w/o relief.  Will dispense Acular drops and switch him to tobramycin.  He will continue warm wet compresses.  Referral info given for ophthalmology.    Final Clinical Impressions(s) / ED Diagnoses   Final diagnoses:  Conjunctivitis of both eyes, unspecified conjunctivitis type    ED Discharge Orders    None       Pauline Aus, PA-C 05/13/17 [id number]    Raeford Razor, MD 05/13/17 [phone]

## 2017-05-15 ENCOUNTER — Other Ambulatory Visit: Payer: Self-pay

## 2017-05-15 ENCOUNTER — Emergency Department (HOSPITAL_COMMUNITY)
Admission: EM | Admit: 2017-05-15 | Discharge: 2017-05-15 | Disposition: A | Discharge status: Home / Self Care | Payer: Medicaid Other | Attending: Emergency Medicine | Admitting: Emergency Medicine

## 2017-05-15 ENCOUNTER — Encounter (HOSPITAL_COMMUNITY): Payer: Self-pay | Admitting: *Deleted

## 2017-05-15 DIAGNOSIS — Z91040 Latex allergy status: Secondary | ICD-10-CM | POA: Insufficient documentation

## 2017-05-15 DIAGNOSIS — F17210 Nicotine dependence, cigarettes, uncomplicated: Secondary | ICD-10-CM | POA: Insufficient documentation

## 2017-05-15 DIAGNOSIS — H10023 Other mucopurulent conjunctivitis, bilateral: Secondary | ICD-10-CM

## 2017-05-15 NOTE — ED Notes (Signed)
Pt states he has been taking 3 different ophthalmic prescriptions as directed and his eye problem has not been relieved.

## 2017-05-15 NOTE — ED Provider Notes (Signed)
[facility]   CSN: [id number] Arrival date & time: 05/15/17  2028     History   Chief Complaint Chief Complaint  Patient presents with  . Eye Pain    HPI Charles [name] is a 27 y.o. male.  Patient is a 27 year old male who presents to the emergency department with a complaint of problems with my eyes.  Patient was initially seen on January 7 or 8 and diagnosed with conjunctivitis.  He was placed on Polytrim drops.  This did not seem to be getting any better, he came back to the emergency department on January 10 at which time he was placed on Acular and tobramycin.  The patient states that this does not seem to be doing a whole lot for his eye, but he acknowledges that he is only been taking for about 3 days.  He is scheduled to return to work on tomorrow and he says that he must have a note excusing him if he is unable to work.  He presents now for reevaluation of his eyes and also to seek a work note if the cannot return to work at full duty.  No new vision changes noted.  No fever or chills.  No other problems reported other than nasal congestion and some sneezing.      Past Medical History:  Diagnosis Date  . Back pain   . Rib pain     There are no active problems to display for this patient.   Past Surgical History:  Procedure Laterality Date  . ADENOIDECTOMY    . TONSILLECTOMY         Home Medications    Prior to Admission medications   Medication Sig Start Date End Date Taking? Authorizing Provider  trimethoprim-polymyxin b (POLYTRIM) ophthalmic solution Place 2 drops into the left eye every 4 (four) hours. 05/10/17   Horton, Mayer Maskerourtney F, MD    Family History Family History  Problem Relation Age of Onset  . Alcohol abuse Father   . Drug abuse Father     Social History Social History   Tobacco Use  . Smoking status: Current Every Day Smoker    Packs/day: 1.00    Years: 10.00    Pack years: 10.00    Types:  Cigarettes  . Smokeless tobacco: Former NeurosurgeonUser    Types: Snuff  Substance Use Topics  . Alcohol use: No    Frequency: Never  . Drug use: No    Comment: former quit in 2012     Allergies   Adhesive [tape]; Amoxicillin; Codeine; Penicillins; and Latex   Review of Systems Review of Systems  Constitutional: Negative for activity change.       All ROS Neg except as noted in HPI  HENT: Positive for congestion. Negative for nosebleeds.   Eyes: Positive for photophobia, discharge and redness.  Respiratory: Negative for cough, shortness of breath and wheezing.   Cardiovascular: Negative for chest pain and palpitations.  Gastrointestinal: Negative for abdominal pain and blood in stool.  Genitourinary: Negative for dysuria, frequency and hematuria.  Musculoskeletal: Negative for arthralgias, back pain and neck pain.  Skin: Negative.   Neurological: Negative for dizziness, seizures and speech difficulty.  Psychiatric/Behavioral: Negative for confusion and hallucinations.     Physical Exam Updated Vital Signs BP (!) 140/91 (BP Location: Right Arm)   Pulse 92   Temp 98.3 F (36.8 C) (Oral)   Resp 17   SpO2 96%   Physical Exam  Constitutional:  He is oriented to person, place, and time. He appears well-developed and well-nourished.  Non-toxic appearance.  HENT:  Head: Normocephalic.  Right Ear: Tympanic membrane and external ear normal.  Left Ear: Tympanic membrane and external ear normal.  Nasal congestion present.  Eyes: EOM and lids are normal. Pupils are equal, round, and reactive to light.  The right and left eye show increased redness of the conjunctiva.  There is exudate noted.  Examination of the lids shows some increased redness with minimal swelling.  There is no foreign body, and no hordeolum noted.  There is no increased redness or swelling of the orbits.  Neck: Normal range of motion. Neck supple. Carotid bruit is not present.  Cardiovascular: Normal rate, regular  rhythm, normal heart sounds, intact distal pulses and normal pulses.  Pulmonary/Chest: Breath sounds normal. No respiratory distress.  Abdominal: Soft. Bowel sounds are normal. There is no tenderness. There is no guarding.  Musculoskeletal: Normal range of motion.  Lymphadenopathy:       Head (right side): No submandibular adenopathy present.       Head (left side): No submandibular adenopathy present.    He has no cervical adenopathy.  Neurological: He is alert and oriented to person, place, and time. He has normal strength. No cranial nerve deficit or sensory deficit.  Skin: Skin is warm and dry.  Psychiatric: He has a normal mood and affect. His speech is normal.  Nursing note and vitals reviewed.    ED Treatments / Results  Labs (all labs ordered are listed, but only abnormal results are displayed) Labs Reviewed - No data to display  EKG  EKG Interpretation None       Radiology No results found.  Procedures Procedures (including critical care time)  Medications Ordered in ED Medications - No data to display   Initial Impression / Assessment and Plan / ED Course  I have reviewed the triage vital signs and the nursing notes.  Pertinent labs & imaging results that were available during my care of the patient were reviewed by me and considered in my medical decision making (see chart for details).       Final Clinical Impressions(s) / ED Diagnoses MDM Vital signs reviewed.  Previous emergency department records reviewed.  The patient continues to show evidence of conjunctivitis with increased redness of the conjunctiva and exudate noted in the eyelashes.  I discussed with the patient that this could very well be a viral conjunctivitis running its its course.  We discussed continuing the antibiotic in the event that there is a bacterial component to this.  I have asked the patient to use cool compresses to the eyes 3 or 4 times daily.  I have also asked him to wash  hands frequently, have the family wash hands frequently, and to wipe down surfaces due to the contagious nature of this illness.  Patient acknowledges understanding of the instructions.  The patient will see Dr. Lita Mains for ophthalmology evaluation if not improving.   Final diagnoses:  Other mucopurulent conjunctivitis of both eyes    ED Discharge Orders    None       Ivery Quale, PA-C 05/15/17 2340    Samuel Jester, DO 05/17/17 1517

## 2017-05-15 NOTE — Discharge Instructions (Signed)
Please use cool compresses to your eyes 3 or 4 times daily.  Please continue the tobramycin and the Acular eyedrops as previously prescribed.  Please wash your hands and have the entire family wash hands frequently.  Please wipe down surfaces. This is highly contagious. 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

## 2017-05-15 NOTE — ED Triage Notes (Signed)
Pt reports continued bilateral eye pain, redness, and drainage.

## 2017-05-23 ENCOUNTER — Other Ambulatory Visit: Payer: Self-pay

## 2017-05-23 ENCOUNTER — Emergency Department (HOSPITAL_COMMUNITY)
Admission: EM | Admit: 2017-05-23 | Discharge: 2017-05-23 | Disposition: A | Discharge status: Home / Self Care | Payer: Medicaid Other | Attending: Emergency Medicine | Admitting: Emergency Medicine

## 2017-05-23 DIAGNOSIS — F17210 Nicotine dependence, cigarettes, uncomplicated: Secondary | ICD-10-CM | POA: Insufficient documentation

## 2017-05-23 DIAGNOSIS — J069 Acute upper respiratory infection, unspecified: Secondary | ICD-10-CM | POA: Insufficient documentation

## 2017-05-23 DIAGNOSIS — Z91040 Latex allergy status: Secondary | ICD-10-CM | POA: Insufficient documentation

## 2017-05-23 DIAGNOSIS — Z8669 Personal history of other diseases of the nervous system and sense organs: Secondary | ICD-10-CM

## 2017-05-23 NOTE — Discharge Instructions (Signed)
Your pinkeye-conjunctivitis seems to have cleared.  Please see Dr. Lita MainsHaines - ophthalmology for additional evaluation if any changes, problems, or concerns about your eye condition.  Please continue to wash hands frequently.

## 2017-05-23 NOTE — ED Triage Notes (Signed)
Return visit  X 2 . States he needs a note to return to work

## 2017-05-23 NOTE — ED Provider Notes (Signed)
[facility] EMERGENCY DEPARTMENT Provider Note   CSN: [id number] Arrival date & time: 05/23/17  1041     History   Chief Complaint Chief Complaint  Patient presents with  . needs return to work note    HPI Charles [name] is a 27 y.o. male.  Patient is a 27 year old male who presents to the emergency department for recheck of his eyes for conjunctivitis, and to obtain a return to work note.  The patient was evaluated on January 7 for conjunctivitis.  He was placed on medication, but return to the emergency department because this medication was not working well.  The patient was changed to another antibiotic and another medication regimen.  He began to have some improvement with this.  He returned a third time as he was noticing some improvement but the condition was not completely resolved.  He was asked to use cool compresses, he was given information on universal precautions, and he was referred to ophthalmology.  The patient returns now.  He states that he did not understand that he was supposed to go to ophthalmology.  He is requesting a note saying that he can safely return to work.  He states that his condition has improved significantly.   The history is provided by the patient.    Past Medical History:  Diagnosis Date  . Back pain   . Rib pain     There are no active problems to display for this patient.   Past Surgical History:  Procedure Laterality Date  . ADENOIDECTOMY    . TONSILLECTOMY         Home Medications    Prior to Admission medications   Medication Sig Start Date End Date Taking? Authorizing Provider  trimethoprim-polymyxin b (POLYTRIM) ophthalmic solution Place 2 drops into the left eye every 4 (four) hours. 05/10/17   Horton, Mayer Masker, MD    Family History Family History  Problem Relation Age of Onset  . Alcohol abuse Father   . Drug abuse Father     Social History Social History   Tobacco Use  . Smoking status: Current Every Day  Smoker    Packs/day: 1.00    Years: 10.00    Pack years: 10.00    Types: Cigarettes  . Smokeless tobacco: Former Neurosurgeon    Types: Snuff  Substance Use Topics  . Alcohol use: No    Frequency: Never  . Drug use: No    Comment: former quit in 2012     Allergies   Adhesive [tape]; Amoxicillin; Codeine; Penicillins; and Latex   Review of Systems Review of Systems  Constitutional: Negative for activity change.       All ROS Neg except as noted in HPI  HENT: Positive for congestion. Negative for nosebleeds.   Eyes: Negative for photophobia, pain and discharge.  Respiratory: Negative for cough, shortness of breath and wheezing.   Cardiovascular: Negative for chest pain and palpitations.  Gastrointestinal: Negative for abdominal pain and blood in stool.  Genitourinary: Negative for dysuria, frequency and hematuria.  Musculoskeletal: Negative for arthralgias, back pain and neck pain.  Skin: Negative.   Neurological: Negative for dizziness, seizures and speech difficulty.  Psychiatric/Behavioral: Negative for confusion and hallucinations.     Physical Exam Updated Vital Signs BP (!) 152/82 (BP Location: Left Arm)   Pulse 63   Temp 98.2 F (36.8 C) (Oral)   Resp 16   Ht 5\' 5"  (1.651 m)   Wt 103 kg (227 lb)  SpO2 100%   BMI 37.77 kg/m   Physical Exam  Constitutional: He is oriented to person, place, and time. He appears well-developed and well-nourished.  Non-toxic appearance.  HENT:  Head: Normocephalic.  Right Ear: Tympanic membrane and external ear normal.  Left Ear: Tympanic membrane and external ear normal.  Nasal congestion present.  Eyes: Conjunctivae, EOM and lids are normal. Pupils are equal, round, and reactive to light. Right eye exhibits no discharge. Left eye exhibits no discharge.  Neck: Normal range of motion. Neck supple. Carotid bruit is not present.  Cardiovascular: Normal rate, regular rhythm, normal heart sounds, intact distal pulses and normal pulses.    Pulmonary/Chest: Breath sounds normal. No respiratory distress.  Abdominal: Soft. Bowel sounds are normal. There is no tenderness. There is no guarding.  Musculoskeletal: Normal range of motion.  Lymphadenopathy:       Head (right side): No submandibular adenopathy present.       Head (left side): No submandibular adenopathy present.    He has no cervical adenopathy.  Neurological: He is alert and oriented to person, place, and time. He has normal strength. No cranial nerve deficit or sensory deficit.  Skin: Skin is warm and dry.  Psychiatric: He has a normal mood and affect. His speech is normal.  Nursing note and vitals reviewed.    ED Treatments / Results  Labs (all labs ordered are listed, but only abnormal results are displayed) Labs Reviewed - No data to display  EKG  EKG Interpretation None       Radiology No results found.  Procedures Procedures (including critical care time)  Medications Ordered in ED Medications - No data to display   Initial Impression / Assessment and Plan / ED Course  I have reviewed the triage vital signs and the nursing notes.  Pertinent labs & imaging results that were available during my care of the patient were reviewed by me and considered in my medical decision making (see chart for details).       Final Clinical Impressions(s) / ED Diagnoses MDM Vital signs within normal limits.  Pulse oximetry is 100% on room air.  On today's examination there is no mucus or drainage noted in the eyelashes.  The conjunctiva is no longer red.  There is no swelling or tenderness of the upper or lower lids.  Patient does have some mild nasal congestion present.  The patient is given medical note saying that he can return to work on tomorrow.  I have instructed the patient that if he has any further problems concerning the conjunctivitis or his eyes that he should be seen by Dr. Jenny ReichmannHaines-ophthalmology.  The information for the eye specialist has  been given to the patient on his discharge instructions.  Father given the patient instructions that if additional information is needed that he will need to sign a release for his job to obtain the medical records.   Final diagnoses:  History of conjunctivitis  Upper respiratory tract infection, unspecified type    ED Discharge Orders    None       Ivery QualeBryant, Tonie Vizcarrondo, PA-C 05/23/17 2017    Samuel JesterMcManus, Kathleen, DO 05/25/17 1559

## 2017-06-03 ENCOUNTER — Encounter (HOSPITAL_COMMUNITY): Payer: Self-pay

## 2017-06-03 ENCOUNTER — Emergency Department (HOSPITAL_COMMUNITY)
Admission: EM | Admit: 2017-06-03 | Discharge: 2017-06-03 | Disposition: A | Discharge status: Home / Self Care | Payer: Self-pay | Attending: Emergency Medicine | Admitting: Emergency Medicine

## 2017-06-03 DIAGNOSIS — Z79899 Other long term (current) drug therapy: Secondary | ICD-10-CM | POA: Insufficient documentation

## 2017-06-03 DIAGNOSIS — R112 Nausea with vomiting, unspecified: Secondary | ICD-10-CM | POA: Insufficient documentation

## 2017-06-03 DIAGNOSIS — R197 Diarrhea, unspecified: Secondary | ICD-10-CM | POA: Insufficient documentation

## 2017-06-03 DIAGNOSIS — F17210 Nicotine dependence, cigarettes, uncomplicated: Secondary | ICD-10-CM | POA: Insufficient documentation

## 2017-06-03 LAB — COMPREHENSIVE METABOLIC PANEL
ALBUMIN: 4.5 g/dL (ref 3.5–5.0)
ALT: 39 U/L (ref 17–63)
ANION GAP: 14 (ref 5–15)
AST: 33 U/L (ref 15–41)
Alkaline Phosphatase: 76 U/L (ref 38–126)
BILIRUBIN TOTAL: 0.7 mg/dL (ref 0.3–1.2)
BUN: 26 mg/dL — ABNORMAL HIGH (ref 6–20)
CHLORIDE: 101 mmol/L (ref 101–111)
CO2: 23 mmol/L (ref 22–32)
Calcium: 9.7 mg/dL (ref 8.9–10.3)
Creatinine, Ser: 0.87 mg/dL (ref 0.61–1.24)
GFR calc Af Amer: >60 mL/min (ref >60)
GFR calc non Af Amer: >60 mL/min (ref >60)
GLUCOSE: 128 mg/dL — AB (ref 65–99)
POTASSIUM: 4.1 mmol/L (ref 3.5–5.1)
Sodium: 138 mmol/L (ref 135–145)
TOTAL PROTEIN: 7.9 g/dL (ref 6.5–8.1)

## 2017-06-03 LAB — LIPASE, BLOOD: LIPASE: 28 U/L (ref 11–51)

## 2017-06-03 LAB — URINALYSIS, ROUTINE W REFLEX MICROSCOPIC
Bilirubin Urine: NEGATIVE
Glucose, UA: NEGATIVE mg/dL
Hgb urine dipstick: NEGATIVE
Ketones, ur: NEGATIVE mg/dL
LEUKOCYTES UA: NEGATIVE
NITRITE: NEGATIVE
PH: 5 (ref 5.0–8.0)
Protein, ur: NEGATIVE mg/dL
SPECIFIC GRAVITY, URINE: 1.028 (ref 1.005–1.030)

## 2017-06-03 LAB — CBC
HEMATOCRIT: 51.9 % (ref 39.0–52.0)
HEMOGLOBIN: 17.8 g/dL — AB (ref 13.0–17.0)
MCH: 30.7 pg (ref 26.0–34.0)
MCHC: 34.3 g/dL (ref 30.0–36.0)
MCV: 89.6 fL (ref 78.0–100.0)
Platelets: 157 10*3/uL (ref 150–400)
RBC: 5.79 MIL/uL (ref 4.22–5.81)
RDW: 13 % (ref 11.5–15.5)
WBC: 16.5 10*3/uL — AB (ref 4.0–10.5)

## 2017-06-03 MED ORDER — SODIUM CHLORIDE 0.9 % IV BOLUS (SEPSIS)
1000.0000 mL | Freq: Once | INTRAVENOUS | Status: AC
  Administered 2017-06-03: 1000 mL via INTRAVENOUS

## 2017-06-03 MED ORDER — DICYCLOMINE HCL 10 MG/ML IM SOLN
20.0000 mg | Freq: Once | INTRAMUSCULAR | Status: AC
  Administered 2017-06-03: 20 mg via INTRAMUSCULAR
  Filled 2017-06-03: qty 2

## 2017-06-03 MED ORDER — ONDANSETRON HCL 4 MG/2ML IJ SOLN
4.0000 mg | Freq: Once | INTRAMUSCULAR | Status: AC
  Administered 2017-06-03: 4 mg via INTRAVENOUS
  Filled 2017-06-03: qty 2

## 2017-06-03 MED ORDER — ONDANSETRON 4 MG PO TBDP: 4.0000 mg | ORAL_TABLET | Freq: Three times a day (TID) | ORAL | 0 refills | Status: DC | PRN

## 2017-06-03 MED ORDER — DICYCLOMINE HCL 20 MG PO TABS: 20.0000 mg | ORAL_TABLET | Freq: Two times a day (BID) | ORAL | 0 refills | Status: DC

## 2017-06-03 NOTE — Discharge Instructions (Signed)
As discussed, your evaluation today has been largely reassuring.  But, it is important that you monitor your condition carefully, and do not hesitate to return to the ED if you develop new, or concerning changes in your condition. ? ?Otherwise, please follow-up with your physician for appropriate ongoing care. ? ?

## 2017-06-03 NOTE — ED Provider Notes (Signed)
[facility] Provider Note   CSN: [id number] Arrival date & time: 06/03/17  1239     History   Chief Complaint Chief Complaint  Patient presents with  . Emesis  . Diarrhea    HPI Charles [name] is a 27 y.o. male.  HPI Patient presents with concern of nausea, vomiting, diarrhea. Patient was in his usual state of health when he went to sleep last night for Soon after awakening this morning about 6 hours ago he noticed nausea, subsequently he has had several episodes of vomiting and diarrhea. During episodes he has crampy diffuse anterior abdominal pain. When he is not actively vomiting or having diarrhea, the pain is not present, as it is currently absent. He notes subjective fever and chills, but no objective fever.  He is here with a male companion who assists with the HPI. He states that he was well prior to the onset of illness, has no history of abdominal surgery.  Since onset no clear alleviating or exacerbating factors.  Past Medical History:  Diagnosis Date  . Back pain   . Rib pain     There are no active problems to display for this patient.   Past Surgical History:  Procedure Laterality Date  . ADENOIDECTOMY    . TONSILLECTOMY         Home Medications    Prior to Admission medications   Medication Sig Start Date End Date Taking? Authorizing Provider  sodium chloride (OCEAN) 0.65 % SOLN nasal spray Place 1 spray into both nostrils as needed for congestion.   Yes [provider]  trimethoprim-polymyxin b (POLYTRIM) ophthalmic solution Place 2 drops into the left eye every 4 (four) hours. Patient not taking: Reported on 06/03/2017 05/10/17   Horton, Mayer Maskerourtney F, MD    Family History Family History  Problem Relation Age of Onset  . Alcohol abuse Father   . Drug abuse Father     Social History Social History   Tobacco Use  . Smoking status: Current Every Day Smoker    Packs/day: 1.00    Years: 10.00    Pack years:  10.00    Types: Cigarettes  . Smokeless tobacco: Former NeurosurgeonUser    Types: Snuff  Substance Use Topics  . Alcohol use: No    Frequency: Never  . Drug use: No    Comment: former quit in 2012     Allergies   Adhesive [tape]; Amoxicillin; Codeine; Penicillins; and Latex   Review of Systems Review of Systems  Constitutional:       Per HPI, otherwise negative  HENT:       Per HPI, otherwise negative  Respiratory:       Per HPI, otherwise negative  Cardiovascular:       Per HPI, otherwise negative  Gastrointestinal: Positive for abdominal pain, diarrhea, nausea and vomiting.  Endocrine:       Negative aside from HPI  Genitourinary:       Neg aside from HPI   Musculoskeletal:       Per HPI, otherwise negative  Skin: Negative.   Neurological: Negative for syncope.     Physical Exam Updated Vital Signs BP 135/88 (BP Location: Right Arm)   Pulse (!) 110   Temp 98.1 F (36.7 C) (Oral)   Resp 18   Ht 5\' 5"  (1.651 m)   Wt 99.8 kg (220 lb)   SpO2 98%   BMI 36.61 kg/m   Physical Exam  Constitutional: He is oriented  to person, place, and time. He appears well-developed. No distress.  HENT:  Head: Normocephalic and atraumatic.  Eyes: Conjunctivae and EOM are normal.  Cardiovascular: Normal rate and regular rhythm.  Pulmonary/Chest: Effort normal. No stridor. No respiratory distress.  Abdominal: He exhibits no distension and no mass. There is no tenderness. There is no rebound and no guarding.  Musculoskeletal: He exhibits no edema.  Neurological: He is alert and oriented to person, place, and time.  Skin: Skin is warm and dry.  Psychiatric: He has a normal mood and affect.  Nursing note and vitals reviewed.    ED Treatments / Results  Labs (all labs ordered are listed, but only abnormal results are displayed) Labs Reviewed  COMPREHENSIVE METABOLIC PANEL - Abnormal; Notable for the following components:      Result Value   Glucose, Bld 128 (*)    BUN 26 (*)    All  other components within normal limits  CBC - Abnormal; Notable for the following components:   WBC 16.5 (*)    Hemoglobin 17.8 (*)    All other components within normal limits  LIPASE, BLOOD  URINALYSIS, ROUTINE W REFLEX MICROSCOPIC    EKG  EKG Interpretation None       Radiology No results found.  Procedures Procedures (including critical care time)  Medications Ordered in ED Medications  sodium chloride 0.9 % bolus 1,000 mL (0 mLs Intravenous Stopped 06/03/17 1604)  ondansetron (ZOFRAN) injection 4 mg (4 mg Intravenous Given 06/03/17 1357)  dicyclomine (BENTYL) injection 20 mg (20 mg Intramuscular Given 06/03/17 1405)     Initial Impression / Assessment and Plan / ED Course  I have reviewed the triage vital signs and the nursing notes.  Pertinent labs & imaging results that were available during my care of the patient were reviewed by me and considered in my medical decision making (see chart for details).     4:55 PM Patient in no distress on repeat exam, symptoms have resolved, no additional vomiting. No additional pain. With his wife we discussed all findings at length, including reassuring labs. With no evidence for acute abdominal processes, some suspicion for gastroenteritis, given the patient's improvement here, he is appropriate for discharge with close outpatient follow-up.  Final Clinical Impressions(s) / ED Diagnoses  Nausea vomiting and diarrhea   Gerhard Munch, MD 06/03/17 [id number]

## 2017-06-03 NOTE — ED Triage Notes (Signed)
Pt reports n/v/d and abd pain since 5 am today.

## 2017-07-28 ENCOUNTER — Other Ambulatory Visit: Payer: Self-pay

## 2017-07-28 ENCOUNTER — Emergency Department (HOSPITAL_COMMUNITY)
Admission: EM | Admit: 2017-07-28 | Discharge: 2017-07-29 | Disposition: A | Discharge status: Home / Self Care | Payer: Medicaid Other | Attending: Emergency Medicine | Admitting: Emergency Medicine

## 2017-07-28 ENCOUNTER — Encounter (HOSPITAL_COMMUNITY): Payer: Self-pay | Admitting: *Deleted

## 2017-07-28 DIAGNOSIS — Z91040 Latex allergy status: Secondary | ICD-10-CM | POA: Insufficient documentation

## 2017-07-28 DIAGNOSIS — Z79899 Other long term (current) drug therapy: Secondary | ICD-10-CM | POA: Insufficient documentation

## 2017-07-28 DIAGNOSIS — R519 Headache, unspecified: Secondary | ICD-10-CM

## 2017-07-28 DIAGNOSIS — F17210 Nicotine dependence, cigarettes, uncomplicated: Secondary | ICD-10-CM | POA: Insufficient documentation

## 2017-07-28 DIAGNOSIS — R51 Headache: Secondary | ICD-10-CM | POA: Insufficient documentation

## 2017-07-28 NOTE — ED Triage Notes (Signed)
Pt c/o headache with sensitivity to lights x 2 days

## 2017-07-29 MED ORDER — DIPHENHYDRAMINE HCL 50 MG/ML IJ SOLN
12.5000 mg | Freq: Once | INTRAMUSCULAR | Status: AC
  Administered 2017-07-29: 12.5 mg via INTRAVENOUS
  Filled 2017-07-29: qty 1

## 2017-07-29 MED ORDER — METOCLOPRAMIDE HCL 5 MG/ML IJ SOLN
10.0000 mg | Freq: Once | INTRAMUSCULAR | Status: AC
  Administered 2017-07-29: 10 mg via INTRAVENOUS
  Filled 2017-07-29: qty 2

## 2017-07-29 MED ORDER — KETOROLAC TROMETHAMINE 30 MG/ML IJ SOLN
30.0000 mg | Freq: Once | INTRAMUSCULAR | Status: AC
  Administered 2017-07-29: 30 mg via INTRAVENOUS
  Filled 2017-07-29: qty 1

## 2017-07-29 MED ORDER — SODIUM CHLORIDE 0.9 % IV BOLUS
1000.0000 mL | Freq: Once | INTRAVENOUS | Status: AC
  Administered 2017-07-29: 1000 mL via INTRAVENOUS

## 2017-07-29 NOTE — Discharge Instructions (Signed)
Return if any problems.

## 2017-07-29 NOTE — ED Provider Notes (Signed)
[facility] EMERGENCY DEPARTMENT Provider Note   CSN: [id number] Arrival date & time: 07/28/17  2316     History   Chief Complaint Chief Complaint  Patient presents with  . Headache    HPI TONI HOFFMEISTER is a 27 y.o. male.  The history is provided by the patient. No language interpreter was used.  Headache   This is a new problem. The current episode started 2 days ago. The problem occurs constantly. The problem has been gradually worsening. The headache is associated with bright light. The pain is located in the frontal region. The quality of the pain is described as sharp. The pain is moderate. The pain does not radiate. Associated symptoms include nausea. Pertinent negatives include no vomiting. He has tried NSAIDs for the symptoms. The treatment provided no relief.  Pt reports he has been diagnosed with migraines but he rarely has.    Past Medical History:  Diagnosis Date  . Back pain   . Rib pain     There are no active problems to display for this patient.   Past Surgical History:  Procedure Laterality Date  . ADENOIDECTOMY    . TONSILLECTOMY          Home Medications    Prior to Admission medications   Medication Sig Start Date End Date Taking? Authorizing Provider  dicyclomine (BENTYL) 20 MG tablet Take 1 tablet (20 mg total) by mouth 2 (two) times daily for 5 days. 06/03/17 06/08/17  Gerhard Munch, MD  ondansetron (ZOFRAN ODT) 4 MG disintegrating tablet Take 1 tablet (4 mg total) by mouth every 8 (eight) hours as needed for nausea or vomiting. 06/03/17   Gerhard Munch, MD  sodium chloride (OCEAN) 0.65 % SOLN nasal spray Place 1 spray into both nostrils as needed for congestion.    [provider]  trimethoprim-polymyxin b (POLYTRIM) ophthalmic solution Place 2 drops into the left eye every 4 (four) hours. Patient not taking: Reported on 06/03/2017 05/10/17   Horton, Mayer Masker, MD    Family History Family History  Problem Relation Age of Onset  .  Alcohol abuse Father   . Drug abuse Father     Social History Social History   Tobacco Use  . Smoking status: Current Every Day Smoker    Packs/day: 1.00    Years: 10.00    Pack years: 10.00    Types: Cigarettes  . Smokeless tobacco: Former Neurosurgeon    Types: Snuff  Substance Use Topics  . Alcohol use: No    Frequency: Never  . Drug use: No    Comment: former quit in 2012     Allergies   Adhesive [tape]; Amoxicillin; Codeine; Penicillins; and Latex   Review of Systems Review of Systems  Gastrointestinal: Positive for nausea. Negative for vomiting.  Neurological: Positive for headaches.  All other systems reviewed and are negative.    Physical Exam Updated Vital Signs BP 123/63 (BP Location: Left Arm)   Pulse 72   Temp 98 F (36.7 C) (Oral)   Resp 20   Ht 5\' 5"  (1.651 m)   Wt 104.3 kg (230 lb)   SpO2 98%   BMI 38.27 kg/m   Physical Exam  Constitutional: He appears well-developed and well-nourished.  HENT:  Head: Normocephalic and atraumatic.  Eyes: Conjunctivae are normal.  Neck: Neck supple.  Cardiovascular: Normal rate and regular rhythm.  No murmur heard. Pulmonary/Chest: Effort normal and breath sounds normal. No respiratory distress.  Abdominal: Soft. There is no tenderness.  Musculoskeletal: He exhibits no edema.  Neurological: He is alert.  Skin: Skin is warm and dry.  Psychiatric: He has a normal mood and affect.  Nursing note and vitals reviewed.    ED Treatments / Results  Labs (all labs ordered are listed, but only abnormal results are displayed) Labs Reviewed - No data to display  EKG None  Radiology No results found.  Procedures Procedures (including critical care time)  Medications Ordered in ED Medications  sodium chloride 0.9 % bolus 1,000 mL (has no administration in time range)  metoCLOPramide (REGLAN) injection 10 mg (has no administration in time range)  diphenhydrAMINE (BENADRYL) injection 12.5 mg (has no  administration in time range)  ketorolac (TORADOL) 30 MG/ML injection 30 mg (has no administration in time range)     Initial Impression / Assessment and Plan / ED Course  I have reviewed the triage vital signs and the nursing notes.  Pertinent labs & imaging results that were available during my care of the patient were reviewed by me and considered in my medical decision making (see chart for details).     MDM  Pt's symptoms consistent with migraine headache.  Pt given migraine cocktail   Final Clinical Impressions(s) / ED Diagnoses   Final diagnoses:  Bad headache    ED Discharge Orders    None    An After Visit Summary was printed and given to the patient.    Elson AreasSofia, Amnah Breuer K, PA-C 07/29/17 0047    Zadie RhineWickline, Donald, MD 07/29/17 [phone]

## 2017-09-09 ENCOUNTER — Other Ambulatory Visit: Payer: Self-pay

## 2017-09-09 ENCOUNTER — Emergency Department (HOSPITAL_COMMUNITY): Payer: Worker's Compensation

## 2017-09-09 ENCOUNTER — Emergency Department (HOSPITAL_COMMUNITY)
Admission: EM | Admit: 2017-09-09 | Discharge: 2017-09-09 | Disposition: A | Discharge status: Home / Self Care | Payer: Worker's Compensation | Attending: Emergency Medicine | Admitting: Emergency Medicine

## 2017-09-09 DIAGNOSIS — S63502A Unspecified sprain of left wrist, initial encounter: Secondary | ICD-10-CM | POA: Diagnosis not present

## 2017-09-09 DIAGNOSIS — Z91040 Latex allergy status: Secondary | ICD-10-CM | POA: Diagnosis not present

## 2017-09-09 DIAGNOSIS — W01198A Fall on same level from slipping, tripping and stumbling with subsequent striking against other object, initial encounter: Secondary | ICD-10-CM | POA: Diagnosis not present

## 2017-09-09 DIAGNOSIS — F17210 Nicotine dependence, cigarettes, uncomplicated: Secondary | ICD-10-CM | POA: Insufficient documentation

## 2017-09-09 DIAGNOSIS — Y9389 Activity, other specified: Secondary | ICD-10-CM | POA: Diagnosis not present

## 2017-09-09 DIAGNOSIS — Y929 Unspecified place or not applicable: Secondary | ICD-10-CM | POA: Insufficient documentation

## 2017-09-09 DIAGNOSIS — Y990 Civilian activity done for income or pay: Secondary | ICD-10-CM | POA: Insufficient documentation

## 2017-09-09 DIAGNOSIS — S61512A Laceration without foreign body of left wrist, initial encounter: Secondary | ICD-10-CM | POA: Insufficient documentation

## 2017-09-09 DIAGNOSIS — S6992XA Unspecified injury of left wrist, hand and finger(s), initial encounter: Secondary | ICD-10-CM | POA: Diagnosis present

## 2017-09-09 MED ORDER — NAPROXEN 500 MG PO TABS: 500.0000 mg | ORAL_TABLET | Freq: Two times a day (BID) | ORAL | 0 refills | Status: DC

## 2017-09-09 MED ORDER — IBUPROFEN 400 MG PO TABS
600.0000 mg | ORAL_TABLET | Freq: Once | ORAL | Status: AC
  Administered 2017-09-09: 600 mg via ORAL
  Filled 2017-09-09: qty 2

## 2017-09-09 MED ORDER — BACITRACIN-NEOMYCIN-POLYMYXIN 400-5-5000 EX OINT
TOPICAL_OINTMENT | Freq: Once | CUTANEOUS | Status: AC
  Administered 2017-09-09: 23:00:00 via TOPICAL
  Filled 2017-09-09: qty 1

## 2017-09-09 NOTE — ED Provider Notes (Signed)
[facility] EMERGENCY DEPARTMENT Provider Note   CSN: [id number] Arrival date & time: 09/09/17  2116     History   Chief Complaint Chief Complaint  Patient presents with  . Wrist Injury    HPI Charles [name] is a 27 y.o. male who presents to the ED with left wrist pain s/p injury. Patient reports he was at work and slipped and fell with his weight going on his left wrist. His arm hit a piece of metal on the way down causing a laceration to the to wrist.   HPI  Past Medical History:  Diagnosis Date  . Back pain   . Rib pain     There are no active problems to display for this patient.   Past Surgical History:  Procedure Laterality Date  . ADENOIDECTOMY    . TONSILLECTOMY          Home Medications    Prior to Admission medications   Medication Sig Start Date End Date Taking? Authorizing Provider  dicyclomine (BENTYL) 20 MG tablet Take 1 tablet (20 mg total) by mouth 2 (two) times daily for 5 days. 06/03/17 06/08/17  Gerhard Munch, MD  naproxen (NAPROSYN) 500 MG tablet Take 1 tablet (500 mg total) by mouth 2 (two) times daily. 09/09/17   Janne Napoleon, NP  ondansetron (ZOFRAN ODT) 4 MG disintegrating tablet Take 1 tablet (4 mg total) by mouth every 8 (eight) hours as needed for nausea or vomiting. 06/03/17   Gerhard Munch, MD  sodium chloride (OCEAN) 0.65 % SOLN nasal spray Place 1 spray into both nostrils as needed for congestion.    [provider]  trimethoprim-polymyxin b (POLYTRIM) ophthalmic solution Place 2 drops into the left eye every 4 (four) hours. Patient not taking: Reported on 06/03/2017 05/10/17   Horton, Mayer Masker, MD    Family History Family History  Problem Relation Age of Onset  . Alcohol abuse Father   . Drug abuse Father     Social History Social History   Tobacco Use  . Smoking status: Current Every Day Smoker    Packs/day: 1.00    Years: 10.00    Pack years: 10.00    Types: Cigarettes  . Smokeless tobacco: Former Neurosurgeon   Types: Snuff  Substance Use Topics  . Alcohol use: No    Frequency: Never  . Drug use: No    Comment: former quit in 2012     Allergies   Adhesive [tape]; Amoxicillin; Codeine; Penicillins; and Latex   Review of Systems Review of Systems  Musculoskeletal: Positive for arthralgias.       Left wrist pain  Skin: Positive for wound.  All other systems reviewed and are negative.    Physical Exam Updated Vital Signs BP 126/87 (BP Location: Right Arm)   Pulse 92   Temp 98.6 F (37 C) (Oral)   Resp 20   Ht  (1.651 m)   Wt 104.3 kg (230 lb)   SpO2 97%   BMI 38.27 kg/m   Physical Exam  Constitutional: He appears well-developed and well-nourished. No distress.  HENT:  Head: Normocephalic.  Eyes: EOM are normal.  Neck: Neck supple.  Cardiovascular: Normal rate.  Pulmonary/Chest: Effort normal.  Abdominal: There is tenderness.  Musculoskeletal:       Left wrist: He exhibits tenderness, swelling and laceration. He exhibits no crepitus and no deformity. Decreased range of motion: due to pain.  Radial pulse 2+, adequate circulation. Superficial laceration to the dorsum of the  left wrist.   Neurological: He is alert.  Skin: Skin is warm and dry.  Psychiatric: He has a normal mood and affect. His behavior is normal.  Nursing note and vitals reviewed.    ED Treatments / Results  Labs (all labs ordered are listed, but only abnormal results are displayed) Labs Reviewed - No data to display Radiology Dg Wrist Complete Left  Result Date: 09/09/2017 CLINICAL DATA:  Pain.  Slip and fall. EXAM: LEFT WRIST - COMPLETE 3+ VIEW COMPARISON:  None. FINDINGS: There is no evidence of fracture or dislocation. There is no evidence of arthropathy or other focal bone abnormality. Soft tissues are unremarkable. IMPRESSION: Negative. Electronically Signed   By: Elsie Stain M.D.   On: 09/09/2017 22:27    Procedures Procedures (including critical care time)  Medications Ordered in  ED Medications  neomycin-bacitracin-polymyxin (NEOSPORIN) ointment ( Topical Given 09/09/17 2324)  ibuprofen (ADVIL,MOTRIN) tablet 600 mg (600 mg Oral Given 09/09/17 2334)     Initial Impression / Assessment and Plan / ED Course  I have reviewed the triage vital signs and the nursing notes. 27 y.o. male here with left wrist pain and swelling s/p fall and superficial wound where he cut his wrist on metal. Patient stable for d/c without fracture or dislocation noted on x-ray and wound does not require sutures. Wound cleaned.  Bacitracin ointment and dressing to wound. Wrist splint applied. Patient without focal neuro deficits. Return precautions discussed.   Final Clinical Impressions(s) / ED Diagnoses   Final diagnoses:  Left wrist sprain, initial encounter  Laceration of left wrist, initial encounter    ED Discharge Orders        Ordered    naproxen (NAPROSYN) 500 MG tablet  2 times daily     09/09/17 2320       Kerrie Buffalo [place], [place] 09/10/17 1847    Margarita Grizzle, MD 09/11/17 0002

## 2017-09-09 NOTE — ED Triage Notes (Signed)
Pt reports slipping at work and falling on wrist, superficial laceration to posterior wrist from hitting piece of metal- bleeding controlled. Pt reports he is unable to move his fingers. Cap refill < 3 seconds, radial and ulnar pulses intact.

## 2017-11-17 ENCOUNTER — Encounter (HOSPITAL_COMMUNITY): Payer: Self-pay | Admitting: Emergency Medicine

## 2017-11-17 ENCOUNTER — Emergency Department (HOSPITAL_COMMUNITY)
Admission: EM | Admit: 2017-11-17 | Discharge: 2017-11-17 | Disposition: A | Discharge status: Home / Self Care | Payer: Worker's Compensation | Attending: Emergency Medicine | Admitting: Emergency Medicine

## 2017-11-17 ENCOUNTER — Other Ambulatory Visit: Payer: Self-pay

## 2017-11-17 DIAGNOSIS — F17210 Nicotine dependence, cigarettes, uncomplicated: Secondary | ICD-10-CM | POA: Diagnosis not present

## 2017-11-17 DIAGNOSIS — Z91040 Latex allergy status: Secondary | ICD-10-CM | POA: Insufficient documentation

## 2017-11-17 DIAGNOSIS — T7840XA Allergy, unspecified, initial encounter: Secondary | ICD-10-CM | POA: Diagnosis present

## 2017-11-17 DIAGNOSIS — L298 Other pruritus: Secondary | ICD-10-CM | POA: Insufficient documentation

## 2017-11-17 DIAGNOSIS — T781XXA Other adverse food reactions, not elsewhere classified, initial encounter: Secondary | ICD-10-CM | POA: Diagnosis not present

## 2017-11-17 MED ORDER — DIPHENHYDRAMINE HCL 25 MG PO CAPS
50.0000 mg | ORAL_CAPSULE | Freq: Once | ORAL | Status: AC
  Administered 2017-11-17: 50 mg via ORAL
  Filled 2017-11-17: qty 2

## 2017-11-17 NOTE — ED Notes (Signed)
Patient states he is allergic to [place] seasoning. 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 No difficulty breathing, no redness, no hives. Lung sounds clear to auscultation. VSS.

## 2017-11-17 NOTE — ED Triage Notes (Signed)
States he is allergic to [place] seasoning and was working around it today.  Pt reports sob.  resp are even and non labored, airway patent.

## 2017-11-17 NOTE — Discharge Instructions (Signed)
Avoid eating or contact with [place] [employer].  Shower and wash your clothing. Benadryl for itching

## 2017-11-18 NOTE — ED Provider Notes (Signed)
[name]    CSN: [id number] Arrival date & time: 11/17/17  1656     History   Chief Complaint Chief Complaint  Patient presents with  . Allergic Reaction    HPI CORTLAN DOLIN is a 27 y.o. male.   The history is provided by the patient. No language interpreter was used.  Allergic Reaction  Presenting symptoms: itching   Presenting symptoms: no difficulty breathing, no difficulty swallowing, no rash, no swelling and no wheezing   Severity:  Mild Prior allergic episodes:  No prior episodes Context: food   Relieved by:  Nothing Worsened by:  Nothing Ineffective treatments:  None tried  Pt reports he is allergic to [place] seasoning.  Pt reports he began itching after handling a plate it was on no injection, no contacct, Past Medical History:  Diagnosis Date  . Back pain   . Rib pain     There are no active problems to display for this patient.   Past Surgical History:  Procedure Laterality Date  . ADENOIDECTOMY    . TONSILLECTOMY         Home Medications    Prior to Admission medications   Medication Sig Start Date End Date Taking? Authorizing Provider  dicyclomine (BENTYL) 20 MG tablet Take 1 tablet (20 mg total) by mouth 2 (two) times daily for 5 days. 06/03/17 06/08/17  Gerhard Munch, MD  naproxen (NAPROSYN) 500 MG tablet Take 1 tablet (500 mg total) by mouth 2 (two) times daily. 09/09/17   Janne Napoleon, NP  ondansetron (ZOFRAN ODT) 4 MG disintegrating tablet Take 1 tablet (4 mg total) by mouth every 8 (eight) hours as needed for nausea or vomiting. 06/03/17   Gerhard Munch, MD  sodium chloride (OCEAN) 0.65 % SOLN nasal spray Place 1 spray into both nostrils as needed for congestion.    [provider]  trimethoprim-polymyxin b (POLYTRIM) ophthalmic solution Place 2 drops into the left eye every 4 (four) hours. Patient not taking: Reported on 06/03/2017 05/10/17   Horton, Mayer Masker, MD    Family History Family History  Problem  Relation Age of Onset  . Alcohol abuse Father   . Drug abuse Father     Social History Social History   Tobacco Use  . Smoking status: Current Every Day Smoker    Packs/day: 1.00    Years: 10.00    Pack years: 10.00    Types: Cigarettes  . Smokeless tobacco: Former Neurosurgeon    Types: Snuff  Substance Use Topics  . Alcohol use: No    Frequency: Never  . Drug use: No    Comment: former quit in 2012     Allergies   Adhesive [tape]; Amoxicillin; Codeine; Penicillins; and Latex   Review of Systems Review of Systems  HENT: Negative for trouble swallowing.   Respiratory: Negative for wheezing.   Skin: Positive for itching. Negative for rash.  All other systems reviewed and are negative.    Physical Exam Triage Vital Signs ED Triage Vitals  Enc Vitals Group     BP 11/17/17 1700 138/80     Pulse Rate 11/17/17 1700 75     Resp 11/17/17 1700 18     Temp 11/17/17 1700 97.9 F (36.6 C)     Temp Source 11/17/17 1700 Temporal     SpO2 11/17/17 1700 99 %     Weight 11/17/17 1700 230 lb (104.3 kg)     Height 11/17/17 1700 5\' 5"  (1.651 m)  Head Circumference --      Peak Flow --      Pain Score 11/17/17 1701 0     Pain Loc --      Pain Edu? --      Excl. in GC? --    No data found.  Updated Vital Signs BP 128/86 (BP Location: Right Arm)   Pulse 74   Temp 98.7 F (37.1 C) (Oral)   Resp 18   Ht 5\' 5"  (1.651 m)   Wt 230 lb (104.3 kg)   SpO2 98%   BMI 38.27 kg/m   Visual Acuity Right Eye Distance:   Left Eye Distance:   Bilateral Distance:    Right Eye Near:   Left Eye Near:    Bilateral Near:     Physical Exam  Constitutional: He is oriented to person, place, and time. He appears well-developed and well-nourished.  HENT:  Head: Normocephalic.  Eyes: EOM are normal.  Neck: Normal range of motion.  Pulmonary/Chest: Effort normal.  Abdominal: He exhibits no distension.  Musculoskeletal: Normal range of motion.  Neurological: He is alert and oriented to  person, place, and time.  Psychiatric: He has a normal mood and affect.  Nursing note and vitals reviewed.    UC Treatments / Results  Labs (all labs ordered are listed, but only abnormal results are displayed) Labs Reviewed - No data to display  EKG None  Radiology No results found.  Procedures Procedures (including critical care time)  Medications Ordered in UC Medications  diphenhydrAMINE (BENADRYL) capsule 50 mg (50 mg Oral Given 11/17/17 1816)    Initial Impression / Assessment and Plan / UC Course  I have reviewed the triage vital signs and the nursing notes.  Pertinent labs & imaging results that were available during my care of the patient were reviewed by me and considered in my medical decision making (see chart for details).     MDM Pt looks well, vitals are normal.  Pt advise benadryl and followup  Final Clinical Impressions(s) / UC Diagnoses   Final diagnoses:  Allergic reaction to food, initial encounter     Discharge Instructions     Avoid eating or contact with [place] [employer].  Shower and wash your clothing. Benadryl for itching   ED Prescriptions    None     Controlled Substance Prescriptions [place] Controlled Substance Registry consulted? Not Applicable   Osie CheeksSofia, Tery Hoeger K, PA-C 11/18/17 1206    Benjiman CorePickering, Nathan, MD 11/19/17 [phone]

## 2017-12-10 IMAGING — CT CT CERVICAL SPINE W/O CM
4 of 7 series · 16 of 33 positions shown, 17 images · non-contrast
Comparison: 07/08/2016

CLINICAL DATA: Neck and shoulder pain extending to the back with
intermittent headache starting at [DATE] a.m. today.

EXAM:
CT HEAD WITHOUT CONTRAST
CT CERVICAL SPINE WITHOUT CONTRAST
TECHNIQUE: Multidetector CT imaging of the head and cervical spine was
performed following the standard protocol without intravenous
contrast. Multiplanar CT image reconstructions of the cervical spine
were also generated.

[Series 4: coronal soft tissue · coronal · 0.30mm/px · 3 of 80 slices shown]
[im 20/80  bone]
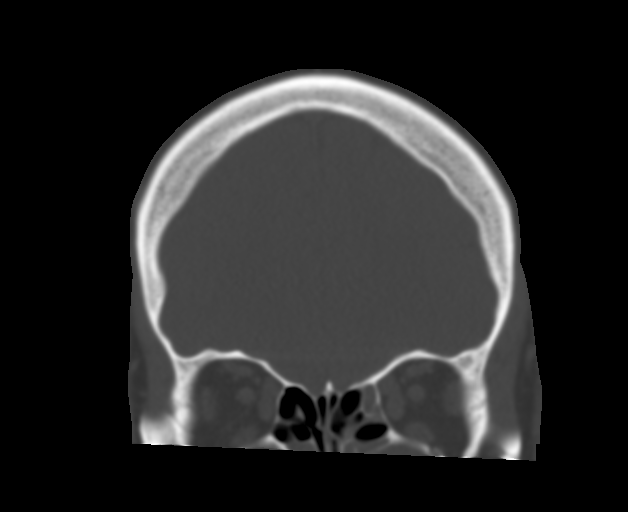
[im 40/80  bone]
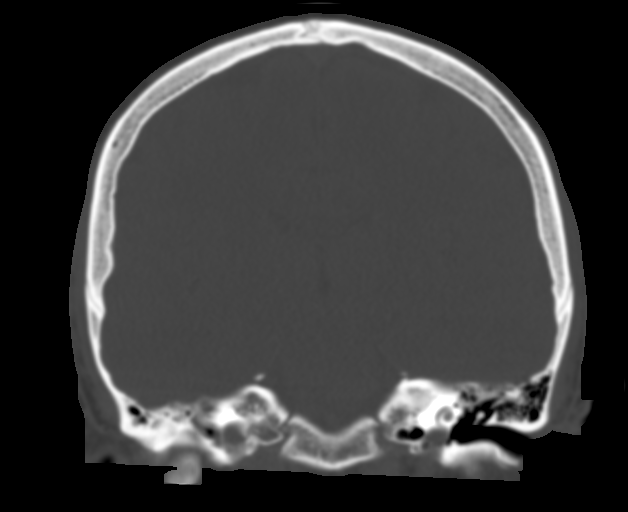
[im 60/80  bone]
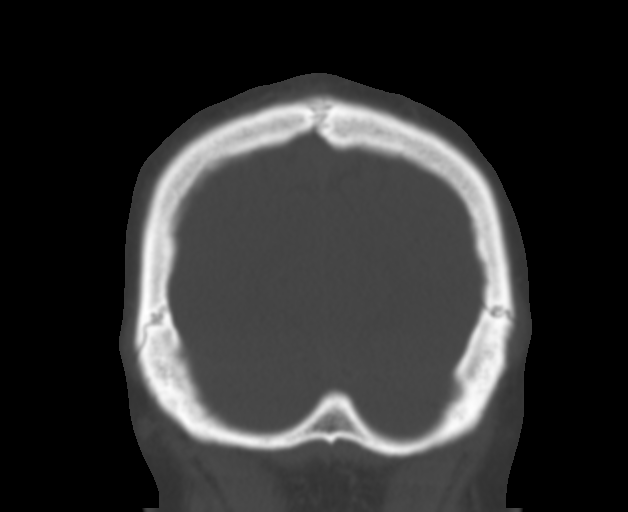

[Series 7: c spine soft · axial · 0.26mm/px · z∈[+1304,+1412]mm · 4 of 92 slices shown]
[im 19/92  soft-tissue]
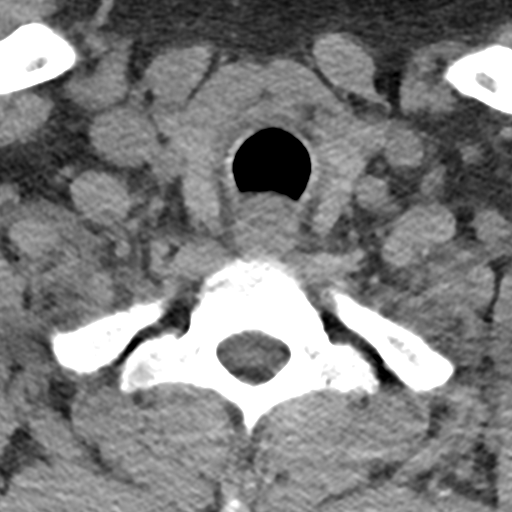
[im 37/92  soft-tissue]
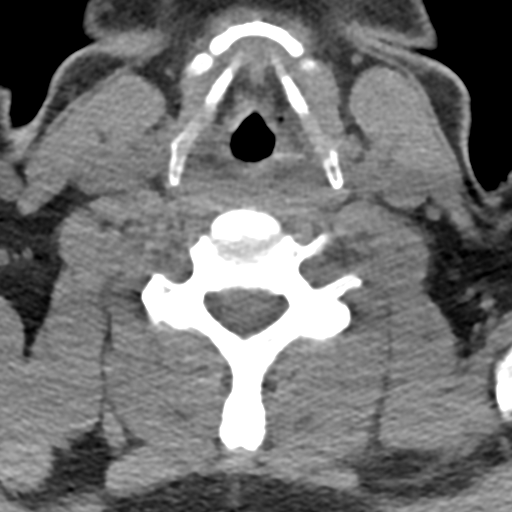
[im 55/92  soft-tissue]
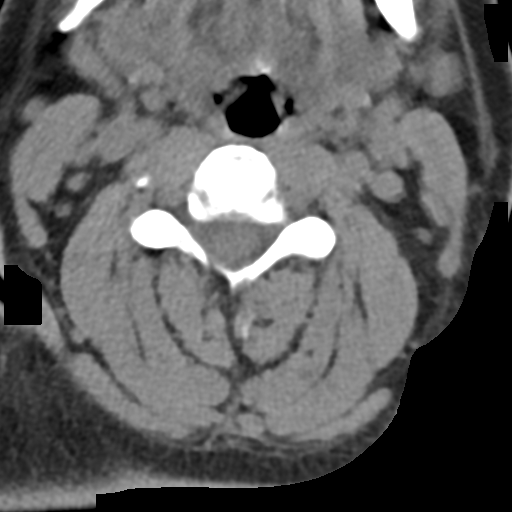
[im 73/92  soft-tissue]
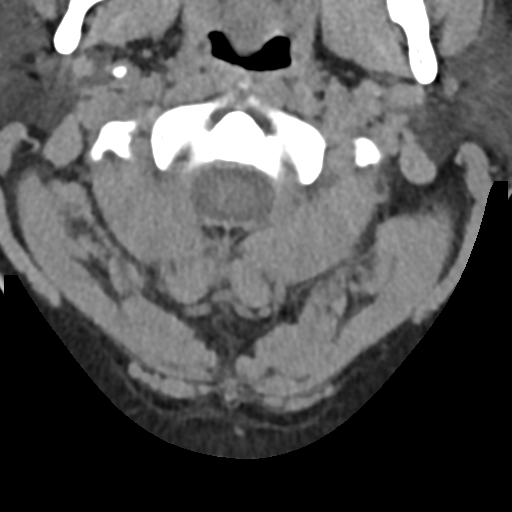

[Series 8: sagittal bone · sagittal · 0.37mm/px · 5 of 61 slices shown]
[im 11/61  bone]
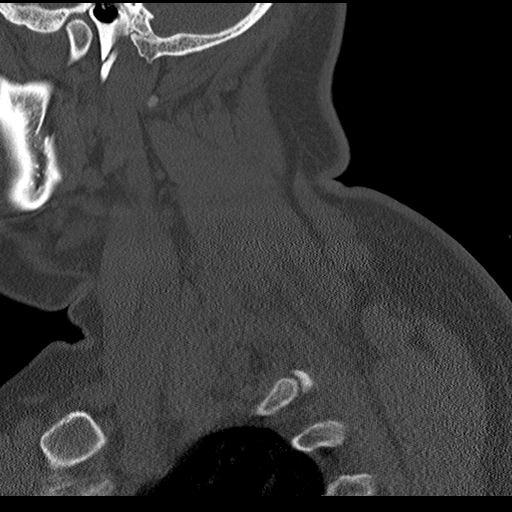
[im 21/61  bone]
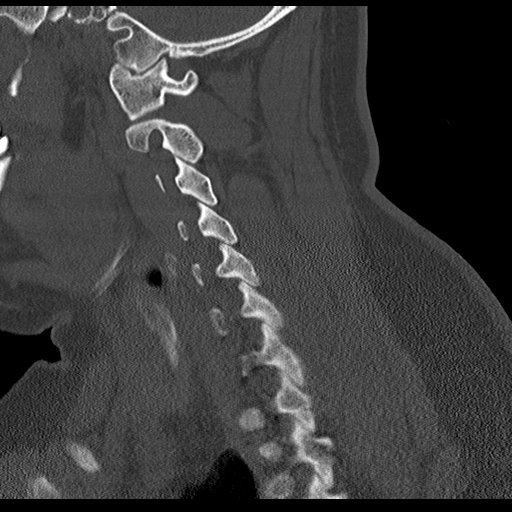
[im 31/61  bone]
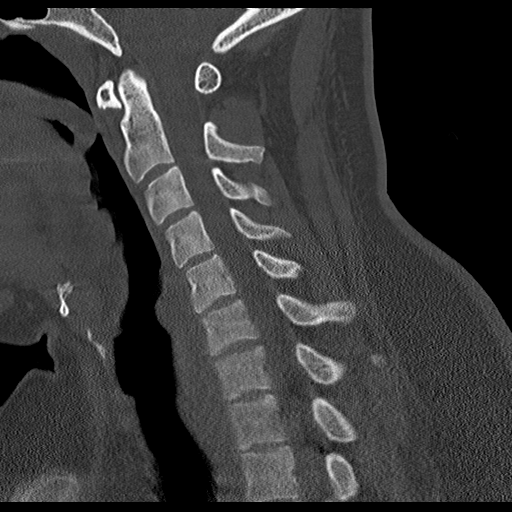
[im 41/61  bone]
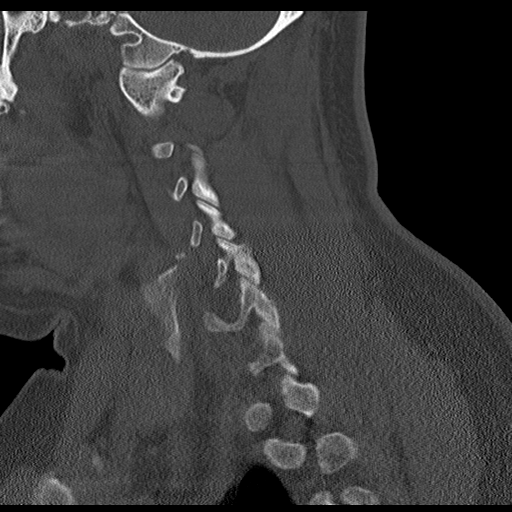
[im 51/61  bone]
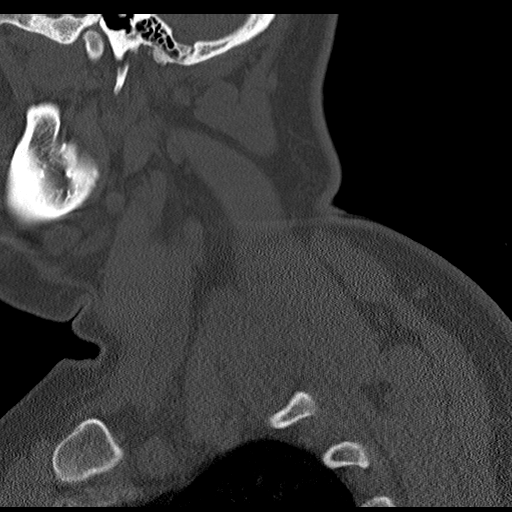

[Series 13: orthogonal bone · axial · 0.21mm/px · z∈[+1291,+1395]mm · 4 of 95 slices shown, 5 images]
[im 19/95  soft-tissue]
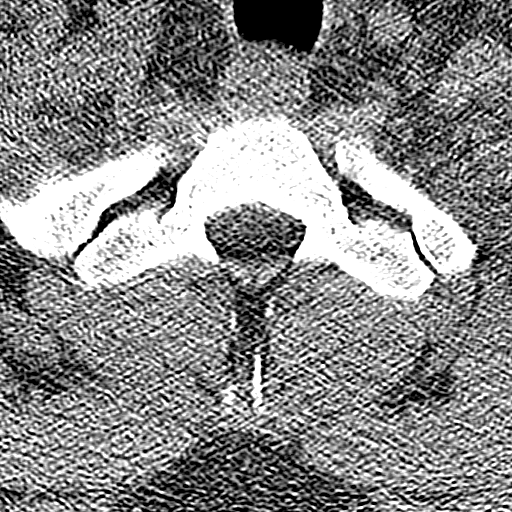
[im 19/95  bone]
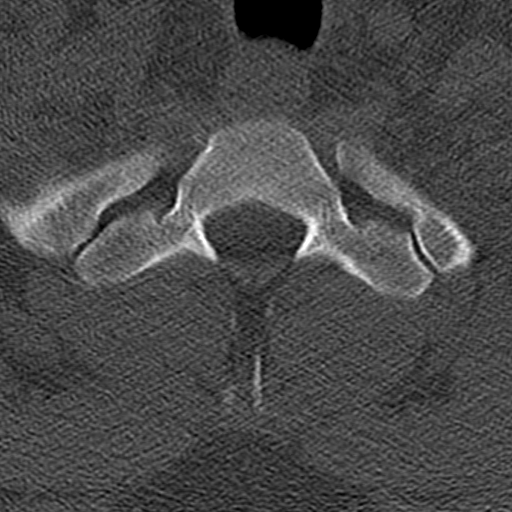
[im 38/95  bone]
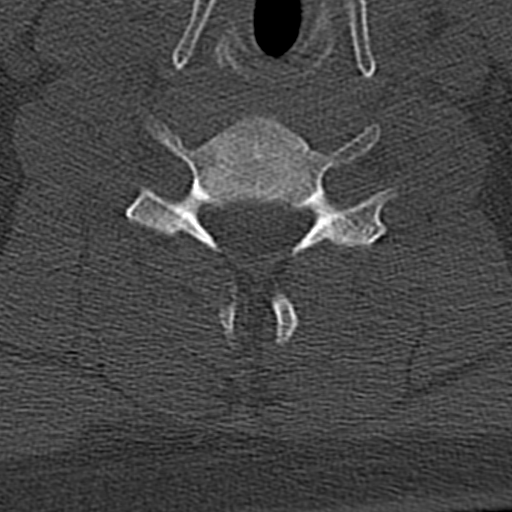
[im 57/95  bone]
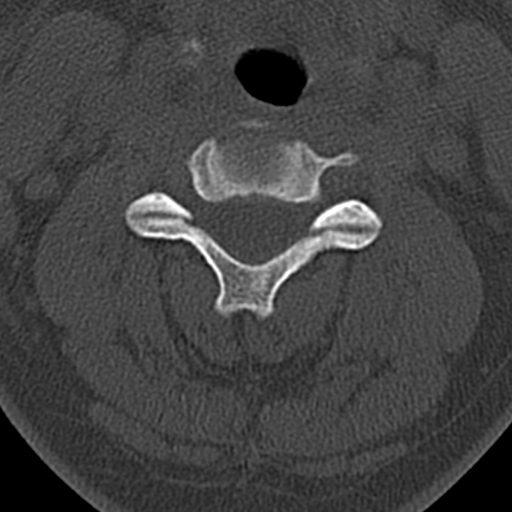
[im 76/95  bone]
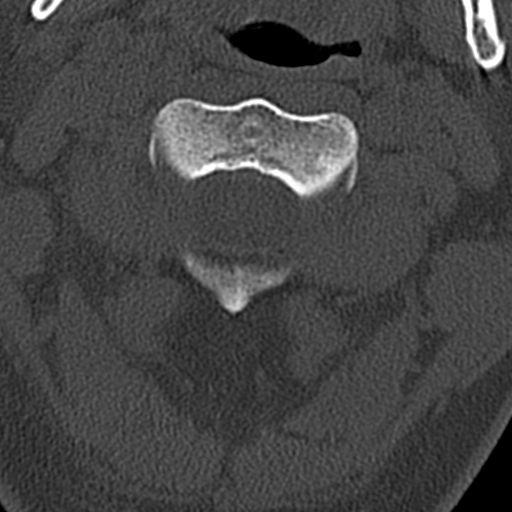

[16 of 33 positions shown; findings below may reference images not displayed]

FINDINGS: CT HEAD FINDINGS

Brain: The brainstem, cerebellum, cerebral peduncles, thalami, basal
ganglia, basilar cisterns, and ventricular system appear within
normal limits. No intracranial hemorrhage, mass lesion, or acute
CVA.

Vascular: Unremarkable

Skull: Unremarkable

Sinuses/Orbits: Chronic ethmoid, maxillary, left sphenoid, and
frontal sinusitis. Effusion of the right mastoid air cells. There is
complete opacification of some of the ethmoid air cells, in the
upper margins of the mastoid air cells are completely opacified
although only the upper margins are included. Visualized orbits
unremarkable.

Other: No supplemental non-categorized findings.

CT CERVICAL SPINE FINDINGS

Alignment: There is straightening of the normal cervical lordosis
without malalignment.

Skull base and vertebrae: No cervical spine fracture or acute bony
findings. No significant bony lesions are observed.

Soft tissues and spinal canal: No prevertebral soft tissue swelling.
Overall unremarkable.

Disc levels:  No individual level impingement identified.

Upper chest: Unremarkable

Other: No supplemental non-categorized findings.
IMPRESSION: 1. Considerable paranasal sinusitis. Right mastoid effusion. The
right mastoid effusion is slightly worsened compared to prior but
otherwise the sinusitis appears similar.
2. No acute intracranial findings or acute cervical spine findings.
3. Straightening of the normal cervical lordosis, similar to prior.
No fracture or subluxation. No obvious signs of impingement.

## 2017-12-26 ENCOUNTER — Other Ambulatory Visit: Payer: Self-pay

## 2017-12-26 ENCOUNTER — Emergency Department (HOSPITAL_COMMUNITY)
Admission: EM | Admit: 2017-12-26 | Discharge: 2017-12-26 | Disposition: A | Discharge status: Home / Self Care | Payer: Self-pay | Attending: Emergency Medicine | Admitting: Emergency Medicine

## 2017-12-26 ENCOUNTER — Encounter (HOSPITAL_COMMUNITY): Payer: Self-pay | Admitting: Emergency Medicine

## 2017-12-26 DIAGNOSIS — W57XXXA Bitten or stung by nonvenomous insect and other nonvenomous arthropods, initial encounter: Secondary | ICD-10-CM | POA: Insufficient documentation

## 2017-12-26 DIAGNOSIS — Y999 Unspecified external cause status: Secondary | ICD-10-CM | POA: Insufficient documentation

## 2017-12-26 DIAGNOSIS — F17210 Nicotine dependence, cigarettes, uncomplicated: Secondary | ICD-10-CM | POA: Insufficient documentation

## 2017-12-26 DIAGNOSIS — Z91040 Latex allergy status: Secondary | ICD-10-CM | POA: Insufficient documentation

## 2017-12-26 DIAGNOSIS — Z79899 Other long term (current) drug therapy: Secondary | ICD-10-CM | POA: Insufficient documentation

## 2017-12-26 DIAGNOSIS — Y939 Activity, unspecified: Secondary | ICD-10-CM | POA: Insufficient documentation

## 2017-12-26 DIAGNOSIS — S70362A Insect bite (nonvenomous), left thigh, initial encounter: Secondary | ICD-10-CM | POA: Insufficient documentation

## 2017-12-26 DIAGNOSIS — Y929 Unspecified place or not applicable: Secondary | ICD-10-CM | POA: Insufficient documentation

## 2017-12-26 MED ORDER — CLINDAMYCIN HCL 150 MG PO CAPS
300.0000 mg | ORAL_CAPSULE | Freq: Once | ORAL | Status: AC
  Administered 2017-12-26: 300 mg via ORAL
  Filled 2017-12-26: qty 2

## 2017-12-26 MED ORDER — CLINDAMYCIN HCL 150 MG PO CAPS: ORAL_CAPSULE | ORAL | 0 refills | Status: DC

## 2017-12-26 NOTE — ED Provider Notes (Signed)
[facility] EMERGENCY DEPARTMENT Provider Note   CSN: [id number] Arrival date & time: 12/26/17  1918     History   Chief Complaint Chief Complaint  Patient presents with  . Insect Bite    HPI Charles [name] is a 27 y.o. male.  Patient is a 27 year old male who presents to the emergency department with an insect bite to the thigh.  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  He says from time to time they find nest of spiders and other insects.  The patient states that he saw small red raised area on his thigh.  When he got home he noticed that there was red area around the raised portion.  Within the next day he noticed that there was a small hole present.  The patient states that he had a brown recluse spider infection in the past and it had a hole in it as well and it required significant intervention.  The patient became concerned about this and presents now to the emergency department for evaluation concerning this insect bite.  The patient has not had fever, chills, unusual weakness, nor noted any red streaks from the area.  He initially noticed some mild drainage present, but no drainage recently.  The history is provided by the patient.    Past Medical History:  Diagnosis Date  . Back pain   . Rib pain     There are no active problems to display for this patient.   Past Surgical History:  Procedure Laterality Date  . ADENOIDECTOMY    . TONSILLECTOMY          Home Medications    Prior to Admission medications   Medication Sig Start Date End Date Taking? Authorizing Provider  dicyclomine (BENTYL) 20 MG tablet Take 1 tablet (20 mg total) by mouth 2 (two) times daily for 5 days. 06/03/17 06/08/17  Gerhard MunchLockwood, Robert, MD  naproxen (NAPROSYN) 500 MG tablet Take 1 tablet (500 mg total) by mouth 2 (two) times daily. 09/09/17   Janne NapoleonNeese, Hope M, NP  ondansetron (ZOFRAN ODT) 4 MG disintegrating tablet Take 1 tablet (4 mg total) by  mouth every 8 (eight) hours as needed for nausea or vomiting. 06/03/17   Gerhard MunchLockwood, Robert, MD  sodium chloride (OCEAN) 0.65 % SOLN nasal spray Place 1 spray into both nostrils as needed for congestion.    [provider]  trimethoprim-polymyxin b (POLYTRIM) ophthalmic solution Place 2 drops into the left eye every 4 (four) hours. Patient not taking: Reported on 06/03/2017 05/10/17   Horton, Mayer Maskerourtney F, MD    Family History Family History  Problem Relation Age of Onset  . Alcohol abuse Father   . Drug abuse Father     Social History Social History   Tobacco Use  . Smoking status: Current Every Day Smoker    Packs/day: 1.00    Years: 10.00    Pack years: 10.00    Types: Cigarettes  . Smokeless tobacco: Former NeurosurgeonUser    Types: Snuff  Substance Use Topics  . Alcohol use: No    Frequency: Never  . Drug use: No    Comment: former quit in 2012     Allergies   Adhesive [tape]; Amoxicillin; Codeine; Penicillins; and Latex   Review of Systems Review of Systems  Constitutional: Negative for activity change.       All ROS Neg except as noted in HPI  HENT: Negative for nosebleeds.  Eyes: Negative for photophobia and discharge.  Respiratory: Negative for cough, shortness of breath and wheezing.   Cardiovascular: Negative for chest pain and palpitations.  Gastrointestinal: Negative for abdominal pain and blood in stool.  Genitourinary: Negative for dysuria, frequency and hematuria.  Musculoskeletal: Negative for arthralgias, back pain and neck pain.  Skin: Positive for wound.       Insect bite wound to the left thigh.  Neurological: Negative for dizziness, seizures and speech difficulty.  Psychiatric/Behavioral: Negative for confusion and hallucinations.     Physical Exam Updated Vital Signs BP 134/76 (BP Location: Right Arm)   Pulse 73   Temp 98.1 F (36.7 C) (Oral)   Resp 20   Ht 5\' 5"  (1.651 m)   Wt 110.3 kg   SpO2 98%   BMI 40.47 kg/m   Physical Exam    Constitutional: He is oriented to person, place, and time. He appears well-developed and well-nourished.  Non-toxic appearance.  HENT:  Head: Normocephalic.  Right Ear: Tympanic membrane and external ear normal.  Left Ear: Tympanic membrane and external ear normal.  Eyes: Pupils are equal, round, and reactive to light. EOM and lids are normal.  Neck: Normal range of motion. Neck supple. Carotid bruit is not present.  Cardiovascular: Normal rate, regular rhythm, normal heart sounds, intact distal pulses and normal pulses.  Pulmonary/Chest: Breath sounds normal. No respiratory distress.  Abdominal: Soft. Bowel sounds are normal. There is no tenderness. There is no guarding.  Musculoskeletal: Normal range of motion.  Small red raised area of the dorsum of the thigh with a scab in the center.  No red streaks appreciated.  The area is not hot to touch.  There is mild to moderate tenderness present.  There is full range of motion of the right and left lower extremity.  Patient is ambulatory without problem  Lymphadenopathy:       Head (right side): No submandibular adenopathy present.       Head (left side): No submandibular adenopathy present.    He has no cervical adenopathy.  Neurological: He is alert and oriented to person, place, and time. He has normal strength. No cranial nerve deficit or sensory deficit.  Skin: Skin is warm and dry.  Psychiatric: He has a normal mood and affect. His speech is normal.  Nursing note and vitals reviewed.    ED Treatments / Results  Labs (all labs ordered are listed, but only abnormal results are displayed) Labs Reviewed - No data to display  EKG None  Radiology No results found.  Procedures Procedures (including critical care time)  Medications Ordered in ED Medications - No data to display   Initial Impression / Assessment and Plan / ED Course  I have reviewed the triage vital signs and the nursing notes.  Pertinent labs & imaging  results that were available during my care of the patient were reviewed by me and considered in my medical decision making (see chart for details).      Final Clinical Impressions(s) / ED Diagnoses MDM  Vital signs within normal limits.  Pulse oximetry is 99% on room air.  The examination favors an insect bite to the thigh.  The family reports that there is been some drainage from the area.  The patient states that the area is sore to touch.  Patient also states he has had complications from other insect bites.  Will cover the patient with clindamycin.  The patient will use Tylenol, and or ibuprofen for soreness.  He  will follow-up with his physicians, or return to the emergency department if any changes in condition, problems, or concerns.   Final diagnoses:  Insect bite of left thigh, initial encounter    ED Discharge Orders    None       Ivery Quale, Cordelia Poche 12/26/17 2205    Raeford Razor, MD 12/27/17 1451

## 2017-12-26 NOTE — Discharge Instructions (Addendum)
Your vital signs within normal limits.  Your oxygen level is 98% on room air.  The wound to your thigh seems to be healing nicely.  Since we are not completely sure exactly what bit you, we will cover this wound with clindamycin 2 times daily with food.  Please apply a bandage to the site daily until it heals completely.  Please see your physician at the health department or return to the emergency department if any high fever, red streaking from the area, changes in your condition, problems, or concerns.

## 2017-12-26 NOTE — ED Triage Notes (Signed)
Pt C/O insect bite to the left outer leg that he noticed on Saturday. Pt denies fevers.

## 2018-02-24 ENCOUNTER — Other Ambulatory Visit: Payer: Self-pay

## 2018-02-24 ENCOUNTER — Encounter (HOSPITAL_COMMUNITY): Payer: Self-pay | Admitting: Emergency Medicine

## 2018-02-24 ENCOUNTER — Emergency Department (HOSPITAL_COMMUNITY)
Admission: EM | Admit: 2018-02-24 | Discharge: 2018-02-24 | Disposition: A | Discharge status: Home / Self Care | Payer: Medicaid Other | Attending: Emergency Medicine | Admitting: Emergency Medicine

## 2018-02-24 ENCOUNTER — Emergency Department (HOSPITAL_COMMUNITY): Payer: Medicaid Other

## 2018-02-24 DIAGNOSIS — Z79899 Other long term (current) drug therapy: Secondary | ICD-10-CM | POA: Insufficient documentation

## 2018-02-24 DIAGNOSIS — M25562 Pain in left knee: Secondary | ICD-10-CM

## 2018-02-24 DIAGNOSIS — F17210 Nicotine dependence, cigarettes, uncomplicated: Secondary | ICD-10-CM | POA: Diagnosis not present

## 2018-02-24 MED ORDER — DICLOFENAC SODIUM 75 MG PO TBEC: 75.0000 mg | DELAYED_RELEASE_TABLET | Freq: Two times a day (BID) | ORAL | 0 refills | Status: DC

## 2018-02-24 NOTE — Discharge Instructions (Addendum)
Apply ice packs on and off to your knee.  Wear the brace as needed for support with weightbearing, but do not wear continuously or at bedtime.  Call 1 of the orthopedic providers listed to arrange a follow-up appointment in 1 week if not improving.

## 2018-02-24 NOTE — ED Triage Notes (Signed)
Pt was running in his kitchen, slipped on water and fell on left knee.

## 2018-02-26 NOTE — ED Provider Notes (Signed)
[facility] EMERGENCY DEPARTMENT Provider Note   CSN: [id number] Arrival date & time: 02/24/18  1152     History   Chief Complaint Chief Complaint  Patient presents with  . Knee Pain    HPI Charles [name] is a 27 y.o. male.  HPI   Charles [name] is a 27 y.o. male who presents to the Emergency Department complaining of left knee pain secondary to a mechanical fall.  He reports slipping on water in the floor, falling and landing on the front of his knee.  He has pain with weight bearing and bending his knee.  Pain improves at rest.  No therapies tried.  Denies numbness or weakness of the extremity, swelling, open wounds.  Denies other injuries.    Past Medical History:  Diagnosis Date  . Back pain   . Rib pain     There are no active problems to display for this patient.   Past Surgical History:  Procedure Laterality Date  . ADENOIDECTOMY    . TONSILLECTOMY        Home Medications    Prior to Admission medications   Medication Sig Start Date End Date Taking? Authorizing Provider  clindamycin (CLEOCIN) 150 MG capsule 2 po bid with food 12/26/17   Ivery Quale, PA-C  diclofenac (VOLTAREN) 75 MG EC tablet Take 1 tablet (75 mg total) by mouth 2 (two) times daily. Take with food 02/24/18   Machele Deihl, PA-C  dicyclomine (BENTYL) 20 MG tablet Take 1 tablet (20 mg total) by mouth 2 (two) times daily for 5 days. 06/03/17 06/08/17  Gerhard Munch, MD  ondansetron (ZOFRAN ODT) 4 MG disintegrating tablet Take 1 tablet (4 mg total) by mouth every 8 (eight) hours as needed for nausea or vomiting. 06/03/17   Gerhard Munch, MD  sodium chloride (OCEAN) 0.65 % SOLN nasal spray Place 1 spray into both nostrils as needed for congestion.    [provider]  trimethoprim-polymyxin b (POLYTRIM) ophthalmic solution Place 2 drops into the left eye every 4 (four) hours. Patient not taking: Reported on 06/03/2017 05/10/17   Horton, Mayer Masker, MD    Family History Family History    Problem Relation Age of Onset  . Alcohol abuse Father   . Drug abuse Father     Social History Social History   Tobacco Use  . Smoking status: Current Every Day Smoker    Packs/day: 1.00    Years: 10.00    Pack years: 10.00    Types: Cigarettes  . Smokeless tobacco: Former Neurosurgeon    Types: Snuff  Substance Use Topics  . Alcohol use: No    Frequency: Never  . Drug use: No    Comment: former quit in 2012     Allergies   Adhesive [tape]; Amoxicillin; Codeine; Penicillins; and Latex   Review of Systems Review of Systems  Constitutional: Negative for chills and fever.  Musculoskeletal: Positive for arthralgias (left knee pain). Negative for joint swelling.  Skin: Negative for color change and wound.  Neurological: Negative for weakness and numbness.     Physical Exam Updated Vital Signs BP (!) 141/82 (BP Location: Right Arm)   Pulse 92   Temp (!) 97.5 F (36.4 C) (Oral)   Resp 18   Ht 5\' 5"  (1.651 m)   Wt 108.9 kg   SpO2 98%   BMI 39.94 kg/m   Physical Exam  Constitutional: He appears well-developed and well-nourished. No distress.  HENT:  Head: Atraumatic.  Cardiovascular: Normal  rate, regular rhythm and intact distal pulses.  Pulmonary/Chest: Effort normal and breath sounds normal.  Musculoskeletal: He exhibits tenderness. He exhibits no edema or deformity.  ttp of the anterior and medial left knee.  No edema, erythema, effusion, or step-off deformity.  Neg drawer sign.    Neurological: He is alert. No sensory deficit.  Skin: Skin is warm and dry. Capillary refill takes less than 2 seconds. No erythema.  Nursing note and vitals reviewed.    ED Treatments / Results  Labs (all labs ordered are listed, but only abnormal results are displayed) Labs Reviewed - No data to display  EKG None  Radiology Dg Knee Complete 4 Views Left  Result Date: 02/24/2018 CLINICAL DATA:  Acute left knee pain after fall last night. EXAM: LEFT KNEE - COMPLETE 4+ VIEW  COMPARISON:  Left knee x-rays dated April 03, 2011. FINDINGS: No evidence of fracture, dislocation, or joint effusion. No evidence of arthropathy or other focal bone abnormality. Soft tissues are unremarkable. IMPRESSION: Negative. Electronically Signed   By: Obie Dredge M.D.   On: 02/24/2018 12:48     Procedures Procedures (including critical care time)  Medications Ordered in ED Medications - No data to display   Initial Impression / Assessment and Plan / ED Course  I have reviewed the triage vital signs and the nursing notes.  Pertinent labs & imaging results that were available during my care of the patient were reviewed by me and considered in my medical decision making (see chart for details).      XR neg for acute bony injury.  No open wounds, edema of the knee.  NV intact.  Likely musculoskeletal.    Knee sleeve applied.  Pt agrees to RICE.    Final Clinical Impressions(s) / ED Diagnoses   Final diagnoses:  Acute pain of left knee    ED Discharge Orders         Ordered    diclofenac (VOLTAREN) 75 MG EC tablet  2 times daily     02/24/18 1308           Pauline Aus, PA-C 02/26/18 2207    Vanetta Mulders, MD 02/27/18 2303

## 2018-03-14 ENCOUNTER — Other Ambulatory Visit: Payer: Self-pay

## 2018-03-14 ENCOUNTER — Emergency Department (HOSPITAL_COMMUNITY)
Admission: EM | Admit: 2018-03-14 | Discharge: 2018-03-14 | Disposition: A | Discharge status: Home / Self Care | Payer: Medicaid Other | Attending: Emergency Medicine | Admitting: Emergency Medicine

## 2018-03-14 ENCOUNTER — Encounter (HOSPITAL_COMMUNITY): Payer: Self-pay | Admitting: Emergency Medicine

## 2018-03-14 DIAGNOSIS — S01112A Laceration without foreign body of left eyelid and periocular area, initial encounter: Secondary | ICD-10-CM | POA: Diagnosis not present

## 2018-03-14 DIAGNOSIS — Y998 Other external cause status: Secondary | ICD-10-CM | POA: Diagnosis not present

## 2018-03-14 DIAGNOSIS — W228XXA Striking against or struck by other objects, initial encounter: Secondary | ICD-10-CM | POA: Insufficient documentation

## 2018-03-14 DIAGNOSIS — Z91040 Latex allergy status: Secondary | ICD-10-CM | POA: Insufficient documentation

## 2018-03-14 DIAGNOSIS — F17210 Nicotine dependence, cigarettes, uncomplicated: Secondary | ICD-10-CM | POA: Diagnosis not present

## 2018-03-14 DIAGNOSIS — Y929 Unspecified place or not applicable: Secondary | ICD-10-CM | POA: Diagnosis not present

## 2018-03-14 DIAGNOSIS — Y9389 Activity, other specified: Secondary | ICD-10-CM | POA: Diagnosis not present

## 2018-03-14 NOTE — ED Triage Notes (Signed)
Pt states wind blew his car door into his face. Pt has small cut noted to left eye lid. Nad. No bleeding. Denies loc. Denies n/v. Denies vision changes. C/o some dizziness. Pt steady with ambulation

## 2018-03-14 NOTE — ED Provider Notes (Signed)
[facility] EMERGENCY DEPARTMENT Provider Note   CSN: [id number] Arrival date & time: 03/14/18  1624     History   Chief Complaint Chief Complaint  Patient presents with  . Laceration    HPI Charles [name] is a 27 y.o. male.  Patient is a 27 year old male who presents to the emergency department with a laceration over the left eye.  Patient states that while opening the door, the wind caught the door and hit him in the corner of the eye.  Patient was able to control bleeding by applying pressure.  Patient denies any visual changes.  He denies being on any anticoagulation medications.  Patient was initially dizzy, but no balance or dizzy changes at this time.  He presents now for assistance with this issue.  The history is provided by the patient.  Laceration      Past Medical History:  Diagnosis Date  . Back pain   . Rib pain     There are no active problems to display for this patient.   Past Surgical History:  Procedure Laterality Date  . ADENOIDECTOMY    . TONSILLECTOMY          Home Medications    Prior to Admission medications   Medication Sig Start Date End Date Taking? Authorizing Provider  clindamycin (CLEOCIN) 150 MG capsule 2 po bid with food 12/26/17   Ivery Quale, PA-C  diclofenac (VOLTAREN) 75 MG EC tablet Take 1 tablet (75 mg total) by mouth 2 (two) times daily. Take with food 02/24/18   Triplett, Tammy, PA-C  dicyclomine (BENTYL) 20 MG tablet Take 1 tablet (20 mg total) by mouth 2 (two) times daily for 5 days. 06/03/17 06/08/17  Gerhard Munch, MD  ondansetron (ZOFRAN ODT) 4 MG disintegrating tablet Take 1 tablet (4 mg total) by mouth every 8 (eight) hours as needed for nausea or vomiting. 06/03/17   Gerhard Munch, MD  sodium chloride (OCEAN) 0.65 % SOLN nasal spray Place 1 spray into both nostrils as needed for congestion.    [provider]  trimethoprim-polymyxin b (POLYTRIM) ophthalmic solution Place 2 drops into the left eye every 4  (four) hours. Patient not taking: Reported on 06/03/2017 05/10/17   Horton, Mayer Masker, MD    Family History Family History  Problem Relation Age of Onset  . Alcohol abuse Father   . Drug abuse Father     Social History Social History   Tobacco Use  . Smoking status: Current Every Day Smoker    Packs/day: 1.00    Years: 10.00    Pack years: 10.00    Types: Cigarettes  . Smokeless tobacco: Former Neurosurgeon    Types: Snuff  Substance Use Topics  . Alcohol use: No    Frequency: Never  . Drug use: No    Comment: former quit in 2012     Allergies   Adhesive [tape]; Amoxicillin; Codeine; Penicillins; and Latex   Review of Systems Review of Systems  Constitutional: Negative for activity change.       All ROS Neg except as noted in HPI  HENT: Negative for nosebleeds.        Eyebrow laceration  Eyes: Negative for photophobia and discharge.  Respiratory: Negative for cough, shortness of breath and wheezing.   Cardiovascular: Negative for chest pain and palpitations.  Gastrointestinal: Negative for abdominal pain and blood in stool.  Genitourinary: Negative for dysuria, frequency and hematuria.  Musculoskeletal: Negative for arthralgias, back pain and neck pain.  Skin:  Negative.   Neurological: Negative for dizziness, seizures and speech difficulty.  Psychiatric/Behavioral: Negative for confusion and hallucinations.     Physical Exam Updated Vital Signs BP 140/84 (BP Location: Right Arm)   Pulse 71   Temp 98 F (36.7 C) (Oral)   Resp 20   Ht 5\' 5"  (1.651 m)   Wt 110.2 kg   SpO2 98%   BMI 40.44 kg/m   Physical Exam  Constitutional: He is oriented to person, place, and time. He appears well-developed and well-nourished.  Non-toxic appearance.  HENT:  Head: Normocephalic.  Right Ear: Tympanic membrane and external ear normal.  Left Ear: Tympanic membrane and external ear normal.  Eyes: Pupils are equal, round, and reactive to light. Conjunctivae, EOM and lids are  normal. No foreign body present in the right eye. No foreign body present in the left eye. Right conjunctiva is not injected. Left conjunctiva is not injected.    Anterior chamber clear extraocular movements are intact.  This is not a through and through laceration.  Neck: Normal range of motion. Neck supple. Carotid bruit is not present.  Cardiovascular: Normal rate, regular rhythm, normal heart sounds, intact distal pulses and normal pulses.  Pulmonary/Chest: Breath sounds normal. No respiratory distress.  Abdominal: Soft. Bowel sounds are normal. There is no tenderness. There is no guarding.  Musculoskeletal: Normal range of motion.  Lymphadenopathy:       Head (right side): No submandibular adenopathy present.       Head (left side): No submandibular adenopathy present.    He has no cervical adenopathy.  Neurological: He is alert and oriented to person, place, and time. He has normal strength. No cranial nerve deficit or sensory deficit.  Skin: Skin is warm and dry.  Psychiatric: He has a normal mood and affect. His speech is normal.  Nursing note and vitals reviewed.    ED Treatments / Results  Labs (all labs ordered are listed, but only abnormal results are displayed) Labs Reviewed - No data to display  EKG None  Radiology No results found.  Procedures .Marland Kitchen.Laceration Repair Date/Time: 03/14/2018 5:51 PM Performed by: Ivery QualeBryant, Varshini Arrants, PA-C Authorized by: Ivery QualeBryant, Arabela Basaldua, PA-C   Consent:    Consent obtained:  Verbal   Consent given by:  Patient   Risks discussed:  Infection, poor cosmetic result and poor wound healing Anesthesia (see MAR for exact dosages):    Anesthesia method:  None Laceration details:    Location: left eyebrow.   Length (cm):  1 Repair type:    Repair type:  Simple Pre-procedure details:    Preparation:  Patient was prepped and draped in usual sterile fashion Exploration:    Hemostasis achieved with:  Direct pressure   Wound extent: no foreign  bodies/material noted and no nerve damage noted   Treatment:    Area cleansed with:  Soap and water Skin repair:    Repair method:  Tissue adhesive Approximation:    Approximation:  Close Post-procedure details:    Dressing:  Open (no dressing)   Patient tolerance of procedure:  Tolerated well, no immediate complications   (including critical care time)  Medications Ordered in ED Medications - No data to display   Initial Impression / Assessment and Plan / ED Course  I have reviewed the triage vital signs and the nursing notes.  Pertinent labs & imaging results that were available during my care of the patient were reviewed by me and considered in my medical decision making (see chart for details).  Final Clinical Impressions(s) / ED Diagnoses MDM  Patient is up-to-date on tetanus.  Patient has a shallow laceration just under the left eyebrow.  The extraocular movements are intact, and the anterior chamber is clear.  No visual changes reported since the incident.  This was repaired with Dermabond.  Patient given instructions to return if any signs of advancing infection.  I discussed with the patient that this Dermabond will come off on its own in the next 5 to 7 days.  Patient acknowledged understanding of the instructions.   Final diagnoses:  Laceration of left eyebrow, initial encounter    ED Discharge Orders    None       Ivery Quale, PA-C 03/14/18 1757    Benjiman Core, MD 03/14/18 [phone]

## 2018-03-14 NOTE — Discharge Instructions (Addendum)
Your laceration was repaired with Dermabond-surgical glue.  This will come off on its own and about 5 to 7 days.  Please allow it to come off on its own.  Please do not apply any lotion, petroleum jelly, Neosporin, or petroleum jelly type products, as this will have an effect on the Dermabond surgical glue.  Please see your physician or return to the emergency department if any red streaks going up the forehead, pus like drainage from the laceration site, or signs of advancing infection.

## 2018-05-20 ENCOUNTER — Other Ambulatory Visit: Payer: Self-pay

## 2018-05-20 ENCOUNTER — Encounter (HOSPITAL_COMMUNITY): Payer: Self-pay | Admitting: Emergency Medicine

## 2018-05-20 ENCOUNTER — Emergency Department (HOSPITAL_COMMUNITY)
Admission: EM | Admit: 2018-05-20 | Discharge: 2018-05-20 | Disposition: A | Discharge status: Home / Self Care | Payer: Medicaid Other | Attending: Emergency Medicine | Admitting: Emergency Medicine

## 2018-05-20 DIAGNOSIS — Z79899 Other long term (current) drug therapy: Secondary | ICD-10-CM | POA: Diagnosis not present

## 2018-05-20 DIAGNOSIS — R2232 Localized swelling, mass and lump, left upper limb: Secondary | ICD-10-CM | POA: Diagnosis present

## 2018-05-20 DIAGNOSIS — F17210 Nicotine dependence, cigarettes, uncomplicated: Secondary | ICD-10-CM | POA: Diagnosis not present

## 2018-05-20 DIAGNOSIS — Z91040 Latex allergy status: Secondary | ICD-10-CM | POA: Insufficient documentation

## 2018-05-20 DIAGNOSIS — L03012 Cellulitis of left finger: Secondary | ICD-10-CM

## 2018-05-20 MED ORDER — SULFAMETHOXAZOLE-TRIMETHOPRIM 800-160 MG PO TABS: 1.0000 | ORAL_TABLET | Freq: Two times a day (BID) | ORAL | 0 refills | Status: AC

## 2018-05-20 MED ORDER — POVIDONE-IODINE 10 % EX SOLN
CUTANEOUS | Status: AC
  Administered 2018-05-20: 16:00:00
  Filled 2018-05-20: qty 15

## 2018-05-20 MED ORDER — PENTAFLUOROPROP-TETRAFLUOROETH EX AERO
INHALATION_SPRAY | CUTANEOUS | Status: AC
  Filled 2018-05-20: qty 116

## 2018-05-20 NOTE — ED Triage Notes (Signed)
Patient c/o left middle finger pain that started last night. Per patient swelling, redness, and painful to touch. Denies any known injury. Denies any fevers.

## 2018-05-20 NOTE — Discharge Instructions (Signed)
Soak area 20 minutes 4 times a day °

## 2018-05-20 NOTE — ED Notes (Signed)
Pt soaking finger in betadine and water.

## 2018-05-20 NOTE — ED Provider Notes (Signed)
[facility] EMERGENCY DEPARTMENT Provider Note   CSN: [id number] Arrival date & time: 05/20/18  1525     History   Chief Complaint Chief Complaint  Patient presents with  . Hand Pain    HPI Charles [name] is a 28 y.o. male.  The history is provided by the patient. No language interpreter was used.  Hand Pain  This is a new problem. The current episode started 2 days ago. The problem occurs hourly. The problem has been gradually worsening. Nothing aggravates the symptoms. Nothing relieves the symptoms. He has tried nothing for the symptoms. The treatment provided no relief.  Pt reports tip of finger is swollen and painful  Past Medical History:  Diagnosis Date  . Back pain   . Rib pain     There are no active problems to display for this patient.   Past Surgical History:  Procedure Laterality Date  . ADENOIDECTOMY    . TONSILLECTOMY          Home Medications    Prior to Admission medications   Medication Sig Start Date End Date Taking? Authorizing Provider  clindamycin (CLEOCIN) 150 MG capsule 2 po bid with food 12/26/17   Ivery Quale, PA-C  diclofenac (VOLTAREN) 75 MG EC tablet Take 1 tablet (75 mg total) by mouth 2 (two) times daily. Take with food 02/24/18   Triplett, Tammy, PA-C  dicyclomine (BENTYL) 20 MG tablet Take 1 tablet (20 mg total) by mouth 2 (two) times daily for 5 days. 06/03/17 06/08/17  Gerhard Munch, MD  ondansetron (ZOFRAN ODT) 4 MG disintegrating tablet Take 1 tablet (4 mg total) by mouth every 8 (eight) hours as needed for nausea or vomiting. 06/03/17   Gerhard Munch, MD  sodium chloride (OCEAN) 0.65 % SOLN nasal spray Place 1 spray into both nostrils as needed for congestion.    [provider]  sulfamethoxazole-trimethoprim (BACTRIM DS,SEPTRA DS) 800-160 MG tablet Take 1 tablet by mouth 2 (two) times daily for 7 days. 05/20/18 05/27/18  Elson Areas, PA-C  trimethoprim-polymyxin b (POLYTRIM) ophthalmic solution Place 2 drops into  the left eye every 4 (four) hours. Patient not taking: Reported on 06/03/2017 05/10/17   Horton, Mayer Masker, MD    Family History Family History  Problem Relation Age of Onset  . Alcohol abuse Father   . Drug abuse Father     Social History Social History   Tobacco Use  . Smoking status: Current Every Day Smoker    Packs/day: 1.00    Years: 10.00    Pack years: 10.00    Types: Cigarettes  . Smokeless tobacco: Former Neurosurgeon    Types: Snuff  Substance Use Topics  . Alcohol use: No    Frequency: Never  . Drug use: No    Comment: former quit in 2012     Allergies   Adhesive [tape]; Amoxicillin; Codeine; Penicillins; and Latex   Review of Systems Review of Systems  All other systems reviewed and are negative.    Physical Exam Updated Vital Signs BP 118/77 (BP Location: Right Arm)   Pulse 96   Temp 98.2 F (36.8 C) (Oral)   Resp 16   Ht 5\' 5"  (1.651 m)   Wt 108.9 kg   SpO2 95%   BMI 39.94 kg/m   Physical Exam Vitals signs reviewed.  Constitutional:      Appearance: Normal appearance.  Musculoskeletal:        General: Swelling and tenderness present.     Comments:  Tender distal left 3rd finger nv and ns intact  Skin:    General: Skin is warm.  Neurological:     General: No focal deficit present.     Mental Status: He is alert.  Psychiatric:        Mood and Affect: Mood normal.      ED Treatments / Results  Labs (all labs ordered are listed, but only abnormal results are displayed) Labs Reviewed - No data to display  EKG None  Radiology No results found.  Procedures .Marland KitchenIncision and Drainage Date/Time: 05/20/2018 4:08 PM Performed by: Elson Areas, PA-C Authorized by: Elson Areas, PA-C   Consent:    Consent obtained:  Verbal   Consent given by:  Patient   Risks discussed:  Bleeding   Alternatives discussed:  No treatment Location:    Type:  Abscess Pre-procedure details:    Skin preparation:  Betadine Anesthesia (see MAR for exact  dosages):    Anesthesia method:  None Procedure type:    Complexity:  Simple Procedure details:    Needle aspiration: yes     Needle size:  18 G   Drainage:  Purulent   Drainage amount:  Moderate   Wound treatment:  Wound left open Post-procedure details:    Patient tolerance of procedure:  Tolerated well, no immediate complications   (including critical care time)  Medications Ordered in ED Medications  pentafluoroprop-tetrafluoroeth (GEBAUERS) aerosol (has no administration in time range)  povidone-iodine (BETADINE) 10 % external solution (  Given 05/20/18 1602)     Initial Impression / Assessment and Plan / ED Course  I have reviewed the triage vital signs and the nursing notes.  Pertinent labs & imaging results that were available during my care of the patient were reviewed by me and considered in my medical decision making (see chart for details).     Pt advised to soak finger 20 minutes 4 times a day  Final Clinical Impressions(s) / ED Diagnoses   Final diagnoses:  Paronychia of finger, left    ED Discharge Orders         Ordered    sulfamethoxazole-trimethoprim (BACTRIM DS,SEPTRA DS) 800-160 MG tablet  2 times daily     05/20/18 1602        An After Visit Summary was printed and given to the patient.    Elson Areas, [place] 05/20/18 1611    Bethann Berkshire, MD 05/20/18 2246

## 2018-05-22 ENCOUNTER — Other Ambulatory Visit: Payer: Self-pay

## 2018-05-22 ENCOUNTER — Encounter (HOSPITAL_COMMUNITY): Payer: Self-pay

## 2018-05-22 ENCOUNTER — Emergency Department (HOSPITAL_COMMUNITY)
Admission: EM | Admit: 2018-05-22 | Discharge: 2018-05-22 | Disposition: A | Discharge status: Home / Self Care | Payer: Medicaid Other | Attending: Emergency Medicine | Admitting: Emergency Medicine

## 2018-05-22 DIAGNOSIS — R112 Nausea with vomiting, unspecified: Secondary | ICD-10-CM

## 2018-05-22 DIAGNOSIS — R197 Diarrhea, unspecified: Secondary | ICD-10-CM | POA: Insufficient documentation

## 2018-05-22 DIAGNOSIS — L03012 Cellulitis of left finger: Secondary | ICD-10-CM | POA: Diagnosis not present

## 2018-05-22 DIAGNOSIS — F17210 Nicotine dependence, cigarettes, uncomplicated: Secondary | ICD-10-CM | POA: Diagnosis not present

## 2018-05-22 LAB — COMPREHENSIVE METABOLIC PANEL
ALK PHOS: 67 U/L (ref 38–126)
ALT: 38 U/L (ref 0–44)
ANION GAP: 9 (ref 5–15)
AST: 28 U/L (ref 15–41)
Albumin: 4.4 g/dL (ref 3.5–5.0)
BILIRUBIN TOTAL: 0.2 mg/dL — AB (ref 0.3–1.2)
BUN: 17 mg/dL (ref 6–20)
CALCIUM: 9.3 mg/dL (ref 8.9–10.3)
CO2: 25 mmol/L (ref 22–32)
Chloride: 103 mmol/L (ref 98–111)
Creatinine, Ser: 0.99 mg/dL (ref 0.61–1.24)
Glucose, Bld: 86 mg/dL (ref 70–99)
Potassium: 3.9 mmol/L (ref 3.5–5.1)
Sodium: 137 mmol/L (ref 135–145)
TOTAL PROTEIN: 7.5 g/dL (ref 6.5–8.1)

## 2018-05-22 LAB — CBC WITH DIFFERENTIAL/PLATELET
Abs Immature Granulocytes: 0.06 10*3/uL (ref 0.00–0.07)
Basophils Absolute: 0.1 10*3/uL (ref 0.0–0.1)
Basophils Relative: 1 %
EOS ABS: 0.4 10*3/uL (ref 0.0–0.5)
EOS PCT: 4 %
HCT: 50.2 % (ref 39.0–52.0)
HEMOGLOBIN: 16.5 g/dL (ref 13.0–17.0)
Immature Granulocytes: 1 %
LYMPHS PCT: 22 %
Lymphs Abs: 2 10*3/uL (ref 0.7–4.0)
MCH: 30.2 pg (ref 26.0–34.0)
MCHC: 32.9 g/dL (ref 30.0–36.0)
MCV: 91.8 fL (ref 80.0–100.0)
MONO ABS: 0.9 10*3/uL (ref 0.1–1.0)
Monocytes Relative: 9 %
Neutro Abs: 5.9 10*3/uL (ref 1.7–7.7)
Neutrophils Relative %: 63 %
Platelets: 154 10*3/uL (ref 150–400)
RBC: 5.47 MIL/uL (ref 4.22–5.81)
RDW: 12.6 % (ref 11.5–15.5)
WBC: 9.3 10*3/uL (ref 4.0–10.5)
nRBC: 0 % (ref 0.0–0.2)

## 2018-05-22 LAB — LIPASE, BLOOD: LIPASE: 30 U/L (ref 11–51)

## 2018-05-22 LAB — ETHANOL

## 2018-05-22 MED ORDER — ONDANSETRON HCL 8 MG PO TABS: 8.0000 mg | ORAL_TABLET | Freq: Three times a day (TID) | ORAL | 0 refills | Status: DC | PRN

## 2018-05-22 MED ORDER — ONDANSETRON HCL 4 MG/2ML IJ SOLN
4.0000 mg | Freq: Once | INTRAMUSCULAR | Status: AC
  Administered 2018-05-22: 4 mg via INTRAVENOUS
  Filled 2018-05-22: qty 2

## 2018-05-22 MED ORDER — BUPIVACAINE HCL (PF) 0.25 % IJ SOLN
10.0000 mL | Freq: Once | INTRAMUSCULAR | Status: AC
  Administered 2018-05-22: 10 mL
  Filled 2018-05-22: qty 30

## 2018-05-22 MED ORDER — POVIDONE-IODINE 10 % EX SOLN
CUTANEOUS | Status: AC
  Administered 2018-05-22: 2
  Filled 2018-05-22: qty 15

## 2018-05-22 MED ORDER — SODIUM CHLORIDE 0.9 % IV BOLUS
1000.0000 mL | Freq: Once | INTRAVENOUS | Status: AC
  Administered 2018-05-22: 1000 mL via INTRAVENOUS

## 2018-05-22 NOTE — Discharge Instructions (Addendum)
Soak your left third finger in warm water for 1 hour 3 or 4 times a day to improve drainage and healing of the infection.  Start with a clear liquid diet today and tomorrow then gradually advance to regular foods after 2 or 3 days.  Prescribing ondansetron to use as needed for nausea or vomiting.  Use Tylenol if needed for pain or fever.

## 2018-05-22 NOTE — ED Notes (Signed)
Bulky dressing placed on patient's left hand/finger.

## 2018-05-22 NOTE — ED Triage Notes (Signed)
Pt was seen Saturday for infected left middle finger and given prescription for Bactrim unable to get abt filled until today and took pill prior to coming ED. Pt reports he has been vomiting and diarrhea along with chills since yesterday. Reports 3 episodes of each the past 24 hrs

## 2018-05-22 NOTE — ED Provider Notes (Signed)
[facility] EMERGENCY DEPARTMENT Provider Note   CSN: [id number] Arrival date & time: 05/22/18  1738     History   Chief Complaint Chief Complaint  Patient presents with  . Emesis  . Hand Pain    HPI Charles [name] is a 28 y.o. male.  HPI   He presents for evaluation of nausea, vomiting diarrhea present for 2 days.  He is also concerned that a paronychia on his left third finger is not improving despite recent treatment with needle drainage.  He is taking Septra for fingertip infection.  He has had a fever.  He has not taken his temperature.  He denies cough, chest pain, focal weakness or paresthesia.  There are no other known modifying factors.  Past Medical History:  Diagnosis Date  . Back pain   . Rib pain     There are no active problems to display for this patient.   Past Surgical History:  Procedure Laterality Date  . ADENOIDECTOMY    . TONSILLECTOMY          Home Medications    Prior to Admission medications   Medication Sig Start Date End Date Taking? Authorizing Provider  acetaminophen (TYLENOL) 500 MG tablet Take 500 mg by mouth every 6 (six) hours as needed for mild pain or fever.   Yes [provider]  sulfamethoxazole-trimethoprim (BACTRIM DS,SEPTRA DS) 800-160 MG tablet Take 1 tablet by mouth 2 (two) times daily for 7 days. 05/20/18 05/27/18 Yes Cheron SchaumannSofia, Leslie K, PA-C  ondansetron (ZOFRAN) 8 MG tablet Take 1 tablet (8 mg total) by mouth every 8 (eight) hours as needed for nausea or vomiting. 05/22/18   Mancel BaleWentz, Dorismar Chay, MD  sodium chloride (OCEAN) 0.65 % SOLN nasal spray Place 1 spray into both nostrils as needed for congestion.    [provider]    Family History Family History  Problem Relation Age of Onset  . Alcohol abuse Father   . Drug abuse Father     Social History Social History   Tobacco Use  . Smoking status: Current Every Day Smoker    Packs/day: 1.00    Years: 10.00    Pack years: 10.00    Types: Cigarettes  .  Smokeless tobacco: Former NeurosurgeonUser    Types: Snuff  Substance Use Topics  . Alcohol use: No    Frequency: Never  . Drug use: No    Comment: former quit in 2012     Allergies   Adhesive [tape]; Amoxicillin; Codeine; Penicillins; and Latex   Review of Systems Review of Systems  All other systems reviewed and are negative.    Physical Exam Updated Vital Signs BP 139/90 (BP Location: Right Arm)   Pulse 90   Temp 98 F (36.7 C) (Oral)   Ht 5\' 5"  (1.651 m)   Wt 108 kg   SpO2 98%   BMI 39.62 kg/m   Physical Exam Vitals signs and nursing note reviewed.  Constitutional:      General: He is not in acute distress.    Appearance: He is well-developed. He is obese. He is not ill-appearing or diaphoretic.  HENT:     Head: Normocephalic and atraumatic.     Right Ear: External ear normal.     Left Ear: External ear normal.  Eyes:     Conjunctiva/sclera: Conjunctivae normal.     Pupils: Pupils are equal, round, and reactive to light.  Neck:     Musculoskeletal: Normal range of motion and neck supple.  Trachea: Phonation normal.  Cardiovascular:     Rate and Rhythm: Normal rate and regular rhythm.     Heart sounds: Normal heart sounds.  Pulmonary:     Effort: Pulmonary effort is normal.     Breath sounds: Normal breath sounds.  Abdominal:     General: There is no distension.     Palpations: Abdomen is soft. There is no mass.     Tenderness: There is no abdominal tenderness. There is no guarding.     Hernia: No hernia is present.  Musculoskeletal:     Comments: Left thyroid finger  with cuticle redness, swelling, and small amount of drainage, from a puncture wound.  Decreased movement of the left third finger secondary to pain.  No other large joint or right hand abnormality.  Skin:    General: Skin is warm and dry.  Neurological:     Mental Status: He is alert and oriented to person, place, and time.     Cranial Nerves: No cranial nerve deficit.     Sensory: No sensory  deficit.     Motor: No abnormal muscle tone.     Coordination: Coordination normal.  Psychiatric:        Behavior: Behavior normal.        Thought Content: Thought content normal.        Judgment: Judgment normal.      ED Treatments / Results  Labs (all labs ordered are listed, but only abnormal results are displayed) Labs Reviewed  COMPREHENSIVE METABOLIC PANEL - Abnormal; Notable for the following components:      Result Value   Total Bilirubin 0.2 (*)    All other components within normal limits  ETHANOL  CBC WITH DIFFERENTIAL/PLATELET  LIPASE, BLOOD    EKG None  Radiology No results found.  Procedures .Nerve Block Date/Time: 05/22/2018 9:06 PM Performed by: Mancel Bale, MD Authorized by: Mancel Bale, MD   Consent:    Consent given by:  Patient   Risks discussed:  Infection, nerve damage and pain   Alternatives discussed:  No treatment Indications:    Indications:  Pain relief Location:    Nerve block body site: Finger, index.   Laterality:  Left Pre-procedure details:    Skin preparation:  Povidone-iodine   Preparation: Patient was prepped and draped in usual sterile fashion   Skin anesthesia (see MAR for exact dosages):    Skin anesthesia method:  None Procedure details (see MAR for exact dosages):    Block needle gauge:  25 G   Anesthetic injected:  Bupivacaine 0.25% w/o epi   Steroid injected:  None   Injection procedure:  Anatomic landmarks identified Post-procedure details:    Outcome:  Anesthesia achieved   Patient tolerance of procedure:  Tolerated well, no immediate complications  .Marland KitchenIncision and Drainage Date/Time: 05/22/2018 10:25 PM Performed by: Mancel Bale, MD Authorized by: Mancel Bale, MD   Consent:    Consent obtained:  Verbal   Consent given by:  Patient   Risks discussed:  Incomplete drainage, pain and bleeding   Alternatives discussed:  No treatment Location:    Type:  Abscess   Size:  1 cm   Location: Left finger  3. Pre-procedure details:    Skin preparation:  Betadine Anesthesia (see MAR for exact dosages):    Anesthesia method:  Nerve block   Block needle gauge:  25 G   Block anesthetic:  Bupivacaine 0.25% w/o epi   Block technique:  Digital   Block injection procedure:  Anatomic landmarks identified   Block outcome:  Anesthesia achieved Procedure type:    Complexity:  Simple Procedure details:    Needle aspiration: no     Incision types:  Single straight (To elevate cuticle)   Incision and drainage depth: 0.5 cm.   Scalpel blade:  11   Wound management:  Probed and deloculated   Drainage:  Purulent   Drainage amount:  Moderate   Wound treatment:  Wound left open   Packing materials:  None Post-procedure details:    Patient tolerance of procedure:  Tolerated well, no immediate complications   (including critical care time)  Medications Ordered in ED Medications  bupivacaine (PF) (MARCAINE) 0.25 % injection 10 mL (10 mLs Infiltration Given 05/22/18 2043)  sodium chloride 0.9 % bolus 1,000 mL (0 mLs Intravenous Stopped 05/22/18 2151)  ondansetron (ZOFRAN) injection 4 mg (4 mg Intravenous Given 05/22/18 2043)  povidone-iodine (BETADINE) 10 % external solution (2 application  Given by Other 05/22/18 2140)     Initial Impression / Assessment and Plan / ED Course  I have reviewed the triage vital signs and the nursing notes.  Pertinent labs & imaging results that were available during my care of the patient were reviewed by me and considered in my medical decision making (see chart for details).      Patient Vitals for the past 24 hrs:  BP Temp Temp src Pulse SpO2 Height Weight  05/22/18 1757 - - - - - 5\' 5"  (1.651 m) 108 kg  05/22/18 1756 139/90 98 F (36.7 C) Oral 90 98 % - -    10:27 PM Reevaluation with update and discussion. After initial assessment and treatment, an updated evaluation reveals he is comfortable has not vomited here since arrival.  No additional complaints.   Findings discussed and questions answered. Mancel Bale   Medical Decision Making: Paronychia, inadequately drained on prior visit.  Incidental nausea and vomiting with diarrhea likely secondary to enteritis.  No metabolic instability.  No hemodynamic instability.  No indication for hospitalization.  CRITICAL CARE-no Performed by: Mancel Bale    Nursing Notes Reviewed/ Care Coordinated Applicable Imaging Reviewed Interpretation of Laboratory Data incorporated into ED treatment  The patient appears reasonably screened and/or stabilized for discharge and I doubt any other medical condition or other [facility] requiring further screening, evaluation, or treatment in the ED at this time prior to discharge.  Plan: Home Medications-continue usual medicines; Home Treatments-wound care at home; return here if the recommended treatment, does not improve the symptoms; Recommended follow up-PCP, PRN   Final Clinical Impressions(s) / ED Diagnoses   Final diagnoses:  Paronychia of finger of left hand  Nausea vomiting and diarrhea    ED Discharge Orders         Ordered    ondansetron (ZOFRAN) 8 MG tablet  Every 8 hours PRN     05/22/18 2233           Mancel Bale, MD 05/22/18 2234

## 2018-05-30 ENCOUNTER — Encounter (HOSPITAL_COMMUNITY): Payer: Self-pay | Admitting: Emergency Medicine

## 2018-05-30 ENCOUNTER — Emergency Department (HOSPITAL_COMMUNITY): Payer: Medicaid Other

## 2018-05-30 ENCOUNTER — Emergency Department (HOSPITAL_COMMUNITY)
Admission: EM | Admit: 2018-05-30 | Discharge: 2018-05-30 | Disposition: A | Discharge status: Home / Self Care | Payer: Medicaid Other | Attending: Emergency Medicine | Admitting: Emergency Medicine

## 2018-05-30 ENCOUNTER — Other Ambulatory Visit: Payer: Self-pay

## 2018-05-30 DIAGNOSIS — F17210 Nicotine dependence, cigarettes, uncomplicated: Secondary | ICD-10-CM | POA: Diagnosis not present

## 2018-05-30 DIAGNOSIS — N200 Calculus of kidney: Secondary | ICD-10-CM | POA: Insufficient documentation

## 2018-05-30 DIAGNOSIS — R1084 Generalized abdominal pain: Secondary | ICD-10-CM | POA: Diagnosis present

## 2018-05-30 DIAGNOSIS — R109 Unspecified abdominal pain: Secondary | ICD-10-CM

## 2018-05-30 LAB — URINALYSIS, ROUTINE W REFLEX MICROSCOPIC
Bilirubin Urine: NEGATIVE
Glucose, UA: NEGATIVE mg/dL
Hgb urine dipstick: NEGATIVE
KETONES UR: NEGATIVE mg/dL
Leukocytes, UA: NEGATIVE
Nitrite: NEGATIVE
Protein, ur: NEGATIVE mg/dL
Specific Gravity, Urine: 1.024 (ref 1.005–1.030)
pH: 6 (ref 5.0–8.0)

## 2018-05-30 LAB — COMPREHENSIVE METABOLIC PANEL
ALT: 45 U/L — ABNORMAL HIGH (ref 0–44)
AST: 31 U/L (ref 15–41)
Albumin: 4.4 g/dL (ref 3.5–5.0)
Alkaline Phosphatase: 69 U/L (ref 38–126)
Anion gap: 8 (ref 5–15)
BUN: 21 mg/dL — AB (ref 6–20)
CO2: 26 mmol/L (ref 22–32)
Calcium: 9.1 mg/dL (ref 8.9–10.3)
Chloride: 104 mmol/L (ref 98–111)
Creatinine, Ser: 0.97 mg/dL (ref 0.61–1.24)
GFR calc Af Amer: >60 mL/min (ref >60)
GFR calc non Af Amer: >60 mL/min (ref >60)
Glucose, Bld: 81 mg/dL (ref 70–99)
POTASSIUM: 3.7 mmol/L (ref 3.5–5.1)
Sodium: 138 mmol/L (ref 135–145)
Total Bilirubin: 0.2 mg/dL — ABNORMAL LOW (ref 0.3–1.2)
Total Protein: 7 g/dL (ref 6.5–8.1)

## 2018-05-30 LAB — CBC WITH DIFFERENTIAL/PLATELET
Abs Immature Granulocytes: 0.05 10*3/uL (ref 0.00–0.07)
Basophils Absolute: 0 10*3/uL (ref 0.0–0.1)
Basophils Relative: 0 %
EOS ABS: 0.4 10*3/uL (ref 0.0–0.5)
Eosinophils Relative: 4 %
HCT: 47.6 % (ref 39.0–52.0)
Hemoglobin: 16.1 g/dL (ref 13.0–17.0)
Immature Granulocytes: 1 %
LYMPHS ABS: 2.3 10*3/uL (ref 0.7–4.0)
Lymphocytes Relative: 23 %
MCH: 30.2 pg (ref 26.0–34.0)
MCHC: 33.8 g/dL (ref 30.0–36.0)
MCV: 89.3 fL (ref 80.0–100.0)
Monocytes Absolute: 0.7 10*3/uL (ref 0.1–1.0)
Monocytes Relative: 7 %
Neutro Abs: 6.8 10*3/uL (ref 1.7–7.7)
Neutrophils Relative %: 65 %
Platelets: 191 10*3/uL (ref 150–400)
RBC: 5.33 MIL/uL (ref 4.22–5.81)
RDW: 12.5 % (ref 11.5–15.5)
WBC: 10.3 10*3/uL (ref 4.0–10.5)
nRBC: 0 % (ref 0.0–0.2)

## 2018-05-30 MED ORDER — ONDANSETRON 4 MG PO TBDP: ORAL_TABLET | ORAL | 0 refills | Status: DC

## 2018-05-30 MED ORDER — TRAMADOL HCL 50 MG PO TABS: 50.0000 mg | ORAL_TABLET | Freq: Four times a day (QID) | ORAL | 0 refills | Status: DC | PRN

## 2018-05-30 MED ORDER — KETOROLAC TROMETHAMINE 30 MG/ML IJ SOLN
30.0000 mg | Freq: Once | INTRAMUSCULAR | Status: AC
  Administered 2018-05-30: 30 mg via INTRAVENOUS
  Filled 2018-05-30: qty 1

## 2018-05-30 MED ORDER — ONDANSETRON HCL 4 MG/2ML IJ SOLN
4.0000 mg | Freq: Once | INTRAMUSCULAR | Status: AC
  Administered 2018-05-30: 4 mg via INTRAVENOUS
  Filled 2018-05-30: qty 2

## 2018-05-30 NOTE — ED Triage Notes (Signed)
Pt C/O bilateral flank pain that started this morning. Pt denies urinary symptoms.

## 2018-05-30 NOTE — ED Provider Notes (Signed)
[facility] EMERGENCY DEPARTMENT Provider Note   CSN: [id number] Arrival date & time: 05/30/18  2101     History   Chief Complaint Chief Complaint  Patient presents with  . Flank Pain    HPI Charles [name] is a 28 y.o. male.  Patient complains of bilateral flank pain.  Patient has a history of kidney stones  The history is provided by the patient. No language interpreter was used.  Flank Pain  This is a new problem. The current episode started 12 to 24 hours ago. The problem occurs constantly. The problem has not changed since onset.Pertinent negatives include no chest pain, no abdominal pain and no headaches. Nothing aggravates the symptoms. Nothing relieves the symptoms. He has tried nothing for the symptoms. The treatment provided no relief.    Past Medical History:  Diagnosis Date  . Back pain   . Rib pain     There are no active problems to display for this patient.   Past Surgical History:  Procedure Laterality Date  . ADENOIDECTOMY    . TONSILLECTOMY          Home Medications    Prior to Admission medications   Medication Sig Start Date End Date Taking? Authorizing Provider  acetaminophen (TYLENOL) 500 MG tablet Take 500 mg by mouth every 6 (six) hours as needed for mild pain or fever.   Yes [provider]  ondansetron (ZOFRAN) 8 MG tablet Take 1 tablet (8 mg total) by mouth every 8 (eight) hours as needed for nausea or vomiting. 05/22/18  Yes Mancel Bale, MD  sodium chloride (OCEAN) 0.65 % SOLN nasal spray Place 1 spray into both nostrils as needed for congestion.   Yes [provider]  ondansetron (ZOFRAN ODT) 4 MG disintegrating tablet 4mg  ODT q4 hours prn nausea/vomit 05/30/18   Bethann Berkshire, MD  traMADol (ULTRAM) 50 MG tablet Take 1 tablet (50 mg total) by mouth every 6 (six) hours as needed. 05/30/18   Bethann Berkshire, MD    Family History Family History  Problem Relation Age of Onset  . Alcohol abuse Father   . Drug abuse  Father     Social History Social History   Tobacco Use  . Smoking status: Current Every Day Smoker    Packs/day: 1.00    Years: 10.00    Pack years: 10.00    Types: Cigarettes  . Smokeless tobacco: Former Neurosurgeon    Types: Snuff  Substance Use Topics  . Alcohol use: No    Frequency: Never  . Drug use: No    Comment: former quit in 2012     Allergies   Adhesive [tape]; Amoxicillin; Codeine; Penicillins; and Latex   Review of Systems Review of Systems  Constitutional: Negative for appetite change and fatigue.  HENT: Negative for congestion, ear discharge and sinus pressure.   Eyes: Negative for discharge.  Respiratory: Negative for cough.   Cardiovascular: Negative for chest pain.  Gastrointestinal: Negative for abdominal pain and diarrhea.  Genitourinary: Positive for flank pain. Negative for frequency and hematuria.  Musculoskeletal: Negative for back pain.  Skin: Negative for rash.  Neurological: Negative for seizures and headaches.  Psychiatric/Behavioral: Negative for hallucinations.     Physical Exam Updated Vital Signs BP (!) 157/90 (BP Location: Right Arm)   Pulse 78   Temp 97.9 F (36.6 C) (Oral)   Resp 16   Ht 5\' 5"  (1.651 m)   Wt 108.9 kg   SpO2 (!) 89%   BMI 39.94  kg/m   Physical Exam Vitals signs and nursing note reviewed.  Constitutional:      Appearance: He is well-developed.  HENT:     Head: Normocephalic.     Nose: Nose normal.  Eyes:     General: No scleral icterus.    Conjunctiva/sclera: Conjunctivae normal.  Neck:     Musculoskeletal: Neck supple.     Thyroid: No thyromegaly.  Cardiovascular:     Rate and Rhythm: Normal rate and regular rhythm.     Heart sounds: No murmur. No friction rub. No gallop.   Pulmonary:     Breath sounds: No stridor. No wheezing or rales.  Chest:     Chest wall: No tenderness.  Abdominal:     General: There is no distension.     Tenderness: There is no abdominal tenderness. There is no rebound.    Musculoskeletal: Normal range of motion.     Comments: Right flank pain  Lymphadenopathy:     Cervical: No cervical adenopathy.  Skin:    Findings: No erythema or rash.  Neurological:     Mental Status: He is oriented to person, place, and time.     Motor: No abnormal muscle tone.     Coordination: Coordination normal.  Psychiatric:        Behavior: Behavior normal.      ED Treatments / Results  Labs (all labs ordered are listed, but only abnormal results are displayed) Labs Reviewed  COMPREHENSIVE METABOLIC PANEL - Abnormal; Notable for the following components:      Result Value   BUN 21 (*)    ALT 45 (*)    Total Bilirubin 0.2 (*)    All other components within normal limits  URINALYSIS, ROUTINE W REFLEX MICROSCOPIC  CBC WITH DIFFERENTIAL/PLATELET    EKG None  Radiology Ct Renal Stone Study  Result Date: 05/30/2018 CLINICAL DATA:  Bilateral flank pain EXAM: CT ABDOMEN AND PELVIS WITHOUT CONTRAST TECHNIQUE: Multidetector CT imaging of the abdomen and pelvis was performed following the standard protocol without IV contrast. COMPARISON:  10/19/2016 07/14/2014 FINDINGS: Lower chest: Right lower lobe nodules are unchanged. No acute consolidation or effusion. Hepatobiliary: No focal liver abnormality is seen. No gallstones, gallbladder wall thickening, or biliary dilatation. Pancreas: Unremarkable. No pancreatic ductal dilatation or surrounding inflammatory changes. Spleen: Normal in size without focal abnormality. Adrenals/Urinary Tract: Adrenal glands are normal. No hydronephrosis. Punctate stone lower pole right kidney. Bladder normal Stomach/Bowel: Stomach is within normal limits. Appendix appears normal. No evidence of bowel wall thickening, distention, or inflammatory changes. Vascular/Lymphatic: No significant vascular findings are present. No enlarged abdominal or pelvic lymph nodes. Reproductive: Prostate is unremarkable. Other: Negative for free air or free fluid.  Musculoskeletal: No acute or significant osseous findings. IMPRESSION: 1. Negative for hydronephrosis or ureteral stone 2. Punctate stone in the lower pole of the right kidney Electronically Signed   By: Jasmine Pang M.D.   On: 05/30/2018 22:14    Procedures Procedures (including critical care time)  Medications Ordered in ED Medications  ketorolac (TORADOL) 30 MG/ML injection 30 mg (30 mg Intravenous Given 05/30/18 2158)  ondansetron (ZOFRAN) injection 4 mg (4 mg Intravenous Given 05/30/18 2158)     Initial Impression / Assessment and Plan / ED Course  I have reviewed the triage vital signs and the nursing notes.  Pertinent labs & imaging results that were available during my care of the patient were reviewed by me and considered in my medical decision making (see chart for details).  Labs and CT scan unremarkable except for small stone in his kidney.  He is given pain medicine nausea medicine will follow-up with PCP  Final Clinical Impressions(s) / ED Diagnoses   Final diagnoses:  Flank pain    ED Discharge Orders         Ordered    traMADol (ULTRAM) 50 MG tablet  Every 6 hours PRN     05/30/18 2329    ondansetron (ZOFRAN ODT) 4 MG disintegrating tablet     05/30/18 2329           Bethann BerkshireZammit, Shriyans Kuenzi, MD 05/30/18 2331

## 2018-05-30 NOTE — Discharge Instructions (Addendum)
Follow-up with your family doctor next week for recheck. 

## 2018-11-01 ENCOUNTER — Other Ambulatory Visit: Payer: Self-pay

## 2018-11-01 DIAGNOSIS — Z20822 Contact with and (suspected) exposure to covid-19: Secondary | ICD-10-CM

## 2018-11-08 LAB — NOVEL CORONAVIRUS, NAA: SARS-CoV-2, NAA: NOT DETECTED

## 2019-02-15 ENCOUNTER — Other Ambulatory Visit: Payer: Self-pay

## 2019-02-15 DIAGNOSIS — Z20822 Contact with and (suspected) exposure to covid-19: Secondary | ICD-10-CM

## 2019-02-16 LAB — NOVEL CORONAVIRUS, NAA: SARS-CoV-2, NAA: NOT DETECTED

## 2019-02-26 ENCOUNTER — Other Ambulatory Visit: Payer: Self-pay

## 2019-02-26 ENCOUNTER — Emergency Department (HOSPITAL_COMMUNITY): Payer: Medicaid Other

## 2019-02-26 ENCOUNTER — Emergency Department (HOSPITAL_COMMUNITY)
Admission: EM | Admit: 2019-02-26 | Discharge: 2019-02-26 | Disposition: A | Discharge status: Home / Self Care | Payer: Medicaid Other | Attending: Emergency Medicine | Admitting: Emergency Medicine

## 2019-02-26 ENCOUNTER — Encounter (HOSPITAL_COMMUNITY): Payer: Self-pay

## 2019-02-26 DIAGNOSIS — S298XXA Other specified injuries of thorax, initial encounter: Secondary | ICD-10-CM | POA: Diagnosis present

## 2019-02-26 DIAGNOSIS — Y929 Unspecified place or not applicable: Secondary | ICD-10-CM | POA: Insufficient documentation

## 2019-02-26 DIAGNOSIS — Z79899 Other long term (current) drug therapy: Secondary | ICD-10-CM | POA: Diagnosis not present

## 2019-02-26 DIAGNOSIS — Y999 Unspecified external cause status: Secondary | ICD-10-CM | POA: Diagnosis not present

## 2019-02-26 DIAGNOSIS — J45909 Unspecified asthma, uncomplicated: Secondary | ICD-10-CM | POA: Insufficient documentation

## 2019-02-26 DIAGNOSIS — S20211A Contusion of right front wall of thorax, initial encounter: Secondary | ICD-10-CM | POA: Insufficient documentation

## 2019-02-26 DIAGNOSIS — W182XXA Fall in (into) shower or empty bathtub, initial encounter: Secondary | ICD-10-CM | POA: Insufficient documentation

## 2019-02-26 DIAGNOSIS — Y93E1 Activity, personal bathing and showering: Secondary | ICD-10-CM | POA: Insufficient documentation

## 2019-02-26 DIAGNOSIS — F17210 Nicotine dependence, cigarettes, uncomplicated: Secondary | ICD-10-CM | POA: Insufficient documentation

## 2019-02-26 HISTORY — DX: Unspecified asthma, uncomplicated: J45.909

## 2019-02-26 MED ORDER — HYDROCODONE-ACETAMINOPHEN 5-325 MG PO TABS: ORAL_TABLET | ORAL | 0 refills | Status: DC

## 2019-02-26 MED ORDER — IBUPROFEN 800 MG PO TABS: 800.0000 mg | ORAL_TABLET | Freq: Three times a day (TID) | ORAL | 0 refills | Status: DC

## 2019-02-26 NOTE — ED Notes (Signed)
Pt unable to sign , computer not working

## 2019-02-26 NOTE — Discharge Instructions (Signed)
Use the incentive spirometer 3-4 times per day.  Use a pillow or folded towel held firmly to your side to cough and take deep breaths throughout the day.  Follow-up with your primary doctor for recheck or return to the ER for any worsening symptoms such as sudden shortness of breath.

## 2019-02-26 NOTE — ED Triage Notes (Signed)
Pt reports he slipped getting out of tub last night and hit right rib cage. Hurts to cough and breath

## 2019-02-27 NOTE — ED Provider Notes (Signed)
[name] EMERGENCY DEPARTMENT Provider Note   CSN: [id number] Arrival date & time: 02/26/19  1113     History   Chief Complaint Chief Complaint  Patient presents with  . Fall    HPI Charles [name] is a 28 y.o. male.     HPI   Charles [name] is a 28 y.o. male who presents to the Emergency Department complaining of right lateral rib pain for 12 hours.  He states that he slipped while getting out of the bathtub and struck his right ribs on the edge of the tub.  He reports pain with cough, deep breathing, and movement.  Pain improves somewhat at rest.  He denies head injury, LOC, shortness of breath, abdominal pain, vomiting or hemoptysis. He has not tried any medications for symptom relief.     Past Medical History:  Diagnosis Date  . Asthma   . Back pain   . Rib pain     There are no active problems to display for this patient.   Past Surgical History:  Procedure Laterality Date  . ADENOIDECTOMY    . TONSILLECTOMY        Home Medications    Prior to Admission medications   Medication Sig Start Date End Date Taking? Authorizing Provider  acetaminophen (TYLENOL) 500 MG tablet Take 500 mg by mouth every 6 (six) hours as needed for mild pain or fever.    [provider]  HYDROcodone-acetaminophen (NORCO/VICODIN) 5-325 MG tablet Take one tab po q 4 hrs prn pain 02/26/19   [name], PA-C  ibuprofen (ADVIL) 800 MG tablet Take 1 tablet (800 mg total) by mouth 3 (three) times daily. 02/26/19   [name], PA-C  ondansetron (ZOFRAN ODT) 4 MG disintegrating tablet 4mg  ODT q4 hours prn nausea/vomit 05/30/18   06/01/18, MD  ondansetron (ZOFRAN) 8 MG tablet Take 1 tablet (8 mg total) by mouth every 8 (eight) hours as needed for nausea or vomiting. 05/22/18   05/24/18, MD  sodium chloride (OCEAN) 0.65 % SOLN nasal spray Place 1 spray into both nostrils as needed for congestion.    [provider]  traMADol (ULTRAM) 50 MG tablet Take 1  tablet (50 mg total) by mouth every 6 (six) hours as needed. 05/30/18   06/01/18, MD    Family History Family History  Problem Relation Age of Onset  . Alcohol abuse Father   . Drug abuse Father     Social History Social History   Tobacco Use  . Smoking status: Current Every Day Smoker    Packs/day: 1.00    Years: 10.00    Pack years: 10.00    Types: Cigarettes  . Smokeless tobacco: Former [employer]    Types: Snuff  Substance Use Topics  . Alcohol use: No    Frequency: Never  . Drug use: No    Comment: former quit in 2012     Allergies   Adhesive [tape], Amoxicillin, Codeine, Penicillins, and Latex   Review of Systems Review of Systems  Constitutional: Negative for appetite change and fever.  Respiratory: Negative for cough, chest tightness and shortness of breath.   Cardiovascular: Positive for chest pain (lateral right rib pain).  Gastrointestinal: Negative for abdominal pain, constipation, nausea and vomiting.  Genitourinary: Negative for decreased urine volume, difficulty urinating, dysuria, flank pain and hematuria.  Musculoskeletal: Positive for back pain. Negative for joint swelling and neck pain.  Skin: Negative for wound.  Neurological: Negative for syncope, weakness, numbness and  headaches.     Physical Exam Updated Vital Signs BP (!) 138/97 (BP Location: Left Arm)   Pulse 79   Temp (!) 97 F (36.1 C) (Oral)   Resp 18   Ht 5\' 5"  (1.651 m)   Wt 108 kg   SpO2 97%   BMI 39.62 kg/m   Physical Exam Vitals signs and nursing note reviewed.  Constitutional:      General: He is not in acute distress.    Appearance: Normal appearance.  HENT:     Head: Atraumatic.     Mouth/Throat:     Mouth: Mucous membranes are moist.  Eyes:     Extraocular Movements: Extraocular movements intact.     Conjunctiva/sclera: Conjunctivae normal.     Pupils: Pupils are equal, round, and reactive to light.  Neck:     Musculoskeletal: Normal range of motion. No  muscular tenderness.  Cardiovascular:     Rate and Rhythm: Normal rate and regular rhythm.     Pulses: Normal pulses.  Pulmonary:     Effort: Pulmonary effort is normal.     Breath sounds: Normal breath sounds.  Chest:     Chest wall: Tenderness (focal ttp of the lateral right ribs.  no ecchymosis or crepitus.  mild guarding of the right ribs) present.  Abdominal:     General: There is no distension.     Palpations: Abdomen is soft. There is no mass.     Tenderness: There is no abdominal tenderness. There is no guarding.  Musculoskeletal: Normal range of motion.  Skin:    General: Skin is warm.     Capillary Refill: Capillary refill takes less than 2 seconds.     Findings: No rash.  Neurological:     General: No focal deficit present.     Mental Status: He is alert.     Sensory: No sensory deficit.     Motor: No weakness.      ED Treatments / Results  Labs (all labs ordered are listed, but only abnormal results are displayed) Labs Reviewed - No data to display  EKG None  Radiology Dg Ribs Unilateral W/chest Right  Result Date: 02/26/2019 CLINICAL DATA:  Right lateral rib pain after fall last night. EXAM: RIGHT RIBS AND CHEST - 3+ VIEW COMPARISON:  Chest x-ray dated December 07, 2016. FINDINGS: No fracture or other bone lesions are seen involving the ribs. There is no evidence of pneumothorax or pleural effusion. Both lungs are clear. Heart size and mediastinal contours are within normal limits. IMPRESSION: Negative. Electronically Signed   By: Titus Dubin M.D.   On: 02/26/2019 12:28    Procedures Procedures (including critical care time)  Medications Ordered in ED Medications - No data to display   Initial Impression / Assessment and Plan / ED Course  I have reviewed the triage vital signs and the nursing notes.  Pertinent labs & imaging results that were available during my care of the patient were reviewed by me and considered in my medical decision making (see  chart for details).        Pt with possible occult rib fx.  CXR does not show evidence of pneumothorax.  Vitals reviewed.   Incentive spirometer dispensed.  Pt agrees to close PCP f/u.  Return precautions discussed.   Final Clinical Impressions(s) / ED Diagnoses   Final diagnoses:  Contusion of rib on right side, initial encounter    ED Discharge Orders         Ordered  ibuprofen (ADVIL) 800 MG tablet  3 times daily     02/26/19 1324    HYDROcodone-acetaminophen (NORCO/VICODIN) 5-325 MG tablet     02/26/19 1324           Pauline Ausriplett, Korion Cuevas, PA-C 02/27/19 0912    Donnetta Hutchingook, Brian, MD 03/01/19 1005

## 2019-05-23 ENCOUNTER — Ambulatory Visit: Payer: Medicaid Other | Attending: Internal Medicine

## 2019-05-23 ENCOUNTER — Other Ambulatory Visit: Payer: Self-pay

## 2019-05-23 DIAGNOSIS — Z20822 Contact with and (suspected) exposure to covid-19: Secondary | ICD-10-CM

## 2019-05-24 LAB — NOVEL CORONAVIRUS, NAA: SARS-CoV-2, NAA: NOT DETECTED

## 2019-08-23 ENCOUNTER — Ambulatory Visit: Payer: Medicaid Other | Attending: Internal Medicine

## 2019-08-23 ENCOUNTER — Other Ambulatory Visit: Payer: Self-pay

## 2019-08-23 DIAGNOSIS — Z20822 Contact with and (suspected) exposure to covid-19: Secondary | ICD-10-CM

## 2019-08-24 LAB — SARS-COV-2, NAA 2 DAY TAT

## 2019-08-24 LAB — NOVEL CORONAVIRUS, NAA: SARS-CoV-2, NAA: NOT DETECTED

## 2019-09-14 ENCOUNTER — Encounter (HOSPITAL_COMMUNITY): Payer: Self-pay | Admitting: Emergency Medicine

## 2019-09-14 ENCOUNTER — Other Ambulatory Visit: Payer: Self-pay

## 2019-09-14 ENCOUNTER — Emergency Department (HOSPITAL_COMMUNITY): Payer: Medicaid Other

## 2019-09-14 ENCOUNTER — Emergency Department (HOSPITAL_COMMUNITY)
Admission: EM | Admit: 2019-09-14 | Discharge: 2019-09-15 | Disposition: A | Discharge status: Home / Self Care | Payer: Medicaid Other | Attending: Emergency Medicine | Admitting: Emergency Medicine

## 2019-09-14 DIAGNOSIS — J45909 Unspecified asthma, uncomplicated: Secondary | ICD-10-CM | POA: Insufficient documentation

## 2019-09-14 DIAGNOSIS — R0602 Shortness of breath: Secondary | ICD-10-CM | POA: Diagnosis present

## 2019-09-14 DIAGNOSIS — Z20822 Contact with and (suspected) exposure to covid-19: Secondary | ICD-10-CM | POA: Diagnosis not present

## 2019-09-14 DIAGNOSIS — F17210 Nicotine dependence, cigarettes, uncomplicated: Secondary | ICD-10-CM | POA: Insufficient documentation

## 2019-09-14 DIAGNOSIS — Z91040 Latex allergy status: Secondary | ICD-10-CM | POA: Diagnosis not present

## 2019-09-14 DIAGNOSIS — K529 Noninfective gastroenteritis and colitis, unspecified: Secondary | ICD-10-CM | POA: Diagnosis not present

## 2019-09-14 MED ORDER — ONDANSETRON HCL 4 MG/2ML IJ SOLN
4.0000 mg | Freq: Once | INTRAMUSCULAR | Status: AC
  Administered 2019-09-14: 4 mg via INTRAVENOUS
  Filled 2019-09-14: qty 2

## 2019-09-14 MED ORDER — KETOROLAC TROMETHAMINE 30 MG/ML IJ SOLN
30.0000 mg | Freq: Once | INTRAMUSCULAR | Status: AC
  Administered 2019-09-14: 30 mg via INTRAVENOUS
  Filled 2019-09-14: qty 1

## 2019-09-14 MED ORDER — LOPERAMIDE HCL 2 MG PO CAPS
4.0000 mg | ORAL_CAPSULE | Freq: Once | ORAL | Status: AC
  Administered 2019-09-14: 4 mg via ORAL
  Filled 2019-09-14: qty 2

## 2019-09-14 MED ORDER — SODIUM CHLORIDE 0.9 % IV BOLUS
1000.0000 mL | Freq: Once | INTRAVENOUS | Status: AC
  Administered 2019-09-14: 1000 mL via INTRAVENOUS

## 2019-09-14 NOTE — ED Triage Notes (Signed)
Pt c/o SOB, emesis, and diarrhea that started today. Pt's son was here last night for N/V/D.

## 2019-09-14 NOTE — ED Provider Notes (Signed)
[facility] EMERGENCY DEPARTMENT Provider Note   CSN: [id number] Arrival date & time: 09/14/19  2043     History Chief Complaint  Patient presents with  . Shortness of Breath    Charles [name] is a 29 y.o. male.  Patient complains of nausea vomiting diarrhea and some shortness of breath.  His son had the same symptoms yesterday  The history is provided by the patient. No language interpreter was used.  Emesis Severity:  Moderate Timing:  Constant Quality:  Stomach contents Able to tolerate:  Liquids Progression:  Worsening Chronicity:  New Recent urination:  Normal Relieved by:  Nothing Associated symptoms: diarrhea   Associated symptoms: no abdominal pain, no cough and no headaches        Past Medical History:  Diagnosis Date  . Asthma   . Back pain   . Rib pain     There are no problems to display for this patient.   Past Surgical History:  Procedure Laterality Date  . ADENOIDECTOMY    . TONSILLECTOMY         Family History  Problem Relation Age of Onset  . Alcohol abuse Father   . Drug abuse Father     Social History   Tobacco Use  . Smoking status: Current Every Day Smoker    Packs/day: 1.00    Years: 10.00    Pack years: 10.00    Types: Cigarettes  . Smokeless tobacco: Former Neurosurgeon    Types: Snuff  Substance Use Topics  . Alcohol use: No  . Drug use: No    Comment: former quit in 2012    Home Medications Prior to Admission medications   Medication Sig Start Date End Date Taking? Authorizing Provider  acetaminophen (TYLENOL) 500 MG tablet Take 500 mg by mouth every 6 (six) hours as needed for mild pain or fever.   Yes [provider]    Allergies    Adhesive [tape], Amoxicillin, Codeine, Penicillins, and Latex  Review of Systems   Review of Systems  Constitutional: Negative for appetite change and fatigue.  HENT: Negative for congestion, ear discharge and sinus pressure.   Eyes: Negative for discharge.  Respiratory:  Negative for cough.   Cardiovascular: Negative for chest pain.  Gastrointestinal: Positive for diarrhea and vomiting. Negative for abdominal pain.  Genitourinary: Negative for frequency and hematuria.  Musculoskeletal: Negative for back pain.  Skin: Negative for rash.  Neurological: Negative for seizures and headaches.  Psychiatric/Behavioral: Negative for hallucinations.    Physical Exam Updated Vital Signs BP 114/66   Pulse (!) 103   Temp 98.4 F (36.9 C)   Resp 18   Ht 5\' 5"  (1.651 m)   Wt 108 kg   SpO2 99%   BMI 39.62 kg/m   Physical Exam Vitals reviewed.  Constitutional:      Appearance: He is well-developed.  HENT:     Head: Normocephalic.     Mouth/Throat:     Mouth: Mucous membranes are moist.  Eyes:     General: No scleral icterus.    Conjunctiva/sclera: Conjunctivae normal.  Neck:     Thyroid: No thyromegaly.  Cardiovascular:     Rate and Rhythm: Normal rate and regular rhythm.     Heart sounds: No murmur. No friction rub. No gallop.   Pulmonary:     Breath sounds: No stridor. No wheezing or rales.  Chest:     Chest wall: No tenderness.  Abdominal:     General: There is no  distension.     Tenderness: There is no abdominal tenderness. There is no rebound.  Musculoskeletal:        General: Normal range of motion.     Cervical back: Neck supple.  Lymphadenopathy:     Cervical: No cervical adenopathy.  Skin:    Findings: No erythema or rash.  Neurological:     Mental Status: He is oriented to person, place, and time.     Motor: No abnormal muscle tone.     Coordination: Coordination normal.  Psychiatric:        Behavior: Behavior normal.     ED Results / Procedures / Treatments   Labs (all labs ordered are listed, but only abnormal results are displayed) Labs Reviewed  SARS CORONAVIRUS 2 BY RT PCR (HOSPITAL ORDER, [facility])  CBC WITH DIFFERENTIAL/PLATELET  COMPREHENSIVE METABOLIC PANEL     EKG None  Radiology DG ABD ACUTE 2+V W 1V CHEST  Result Date: 09/14/2019 CLINICAL DATA:  Pain and diarrhea EXAM: DG ABDOMEN ACUTE W/ 1V CHEST COMPARISON:  01/27/2011 FINDINGS: Cardiac shadows within normal limits. The lungs are clear. No bony abnormality is noted. Scattered large and small bowel gas is noted. No obstructive changes are noted. No abnormal mass or abnormal calcifications are noted. No bony abnormality is seen. IMPRESSION: No acute abnormality noted. Electronically Signed   By: Inez Catalina M.D.   On: 09/14/2019 22:43    Procedures Procedures (including critical care time)  Medications Ordered in ED Medications  sodium chloride 0.9 % bolus 1,000 mL (1,000 mLs Intravenous New Bag/Given 09/14/19 2247)  ondansetron (ZOFRAN) injection 4 mg (4 mg Intravenous Given 09/14/19 2246)  ketorolac (TORADOL) 30 MG/ML injection 30 mg (30 mg Intravenous Given 09/14/19 2246)  loperamide (IMODIUM) capsule 4 mg (4 mg Oral Given 09/14/19 2247)    ED Course  I have reviewed the triage vital signs and the nursing notes.  Pertinent labs & imaging results that were available during my care of the patient were reviewed by me and considered in my medical decision making (see chart for details).    MDM Rules/Calculators/A&P                      Patient with vomiting and diarrhea patient improved with fluids      This patient presents to the ED for concern of vomiting diarrhea, this involves an extensive number of treatment options, and is a complaint that carries with it a high risk of complications and morbidity.  The differential diagnosis includes gastroenteritis Covid appendicitis   Lab Tests:   I Ordered, reviewed, and interpreted labs, which included CBC chemistries Covid negative  Medicines ordered:   I ordered medication Toradol for pain Zofran for nausea  Imaging Studies ordered:   I ordered imaging studies which included acute abdominal series and  I independently  visualized and interpreted imaging which showed unremarkable  Additional history obtained:   Additional history obtained from record  Previous records obtained and reviewed   Consultations Obtained:   Reevaluation:  After the interventions stated above, I reevaluated the patient and found improved  Critical Interventions:  .   Final Clinical Impression(s) / ED Diagnoses Final diagnoses:  None    Rx / DC Orders ED Discharge Orders    None       Milton Ferguson, MD 09/15/19 1110

## 2019-09-15 LAB — CBC WITH DIFFERENTIAL/PLATELET
Abs Immature Granulocytes: 0.04 10*3/uL (ref 0.00–0.07)
Basophils Absolute: 0 10*3/uL (ref 0.0–0.1)
Basophils Relative: 0 %
Eosinophils Absolute: 0.4 10*3/uL (ref 0.0–0.5)
Eosinophils Relative: 4 %
HCT: 47.8 % (ref 39.0–52.0)
Hemoglobin: 15.8 g/dL (ref 13.0–17.0)
Immature Granulocytes: 0 %
Lymphocytes Relative: 12 %
Lymphs Abs: 1.1 10*3/uL (ref 0.7–4.0)
MCH: 30.4 pg (ref 26.0–34.0)
MCHC: 33.1 g/dL (ref 30.0–36.0)
MCV: 92.1 fL (ref 80.0–100.0)
Monocytes Absolute: 0.6 10*3/uL (ref 0.1–1.0)
Monocytes Relative: 6 %
Neutro Abs: 7.1 10*3/uL (ref 1.7–7.7)
Neutrophils Relative %: 78 %
Platelets: 159 10*3/uL (ref 150–400)
RBC: 5.19 MIL/uL (ref 4.22–5.81)
RDW: 12.9 % (ref 11.5–15.5)
WBC: 9.3 10*3/uL (ref 4.0–10.5)
nRBC: 0 % (ref 0.0–0.2)

## 2019-09-15 LAB — COMPREHENSIVE METABOLIC PANEL
ALT: 26 U/L (ref 0–44)
AST: 22 U/L (ref 15–41)
Albumin: 3.7 g/dL (ref 3.5–5.0)
Alkaline Phosphatase: 72 U/L (ref 38–126)
Anion gap: 8 (ref 5–15)
BUN: 16 mg/dL (ref 6–20)
CO2: 22 mmol/L (ref 22–32)
Calcium: 8.4 mg/dL — ABNORMAL LOW (ref 8.9–10.3)
Chloride: 106 mmol/L (ref 98–111)
Creatinine, Ser: 0.82 mg/dL (ref 0.61–1.24)
GFR calc Af Amer: >60 mL/min (ref >60)
GFR calc non Af Amer: >60 mL/min (ref >60)
Glucose, Bld: 111 mg/dL — ABNORMAL HIGH (ref 70–99)
Potassium: 3.5 mmol/L (ref 3.5–5.1)
Sodium: 136 mmol/L (ref 135–145)
Total Bilirubin: 0.6 mg/dL (ref 0.3–1.2)
Total Protein: 6.3 g/dL — ABNORMAL LOW (ref 6.5–8.1)

## 2019-09-15 LAB — SARS CORONAVIRUS 2 BY RT PCR (HOSPITAL ORDER, PERFORMED IN ~~LOC~~ HOSPITAL LAB): SARS Coronavirus 2: NEGATIVE

## 2019-09-15 MED ORDER — ONDANSETRON HCL 4 MG PO TABS: 4.0000 mg | ORAL_TABLET | Freq: Four times a day (QID) | ORAL | 0 refills | Status: DC | PRN

## 2019-09-15 NOTE — ED Provider Notes (Signed)
Patient signed out to me by Dr. Estell Harpin to follow progress.  Patient treated with fluids and medications for nausea, vomiting and diarrhea.  Tachycardia has resolved, patient reports that he feels much improved.  Labs are unremarkable.  Symptoms appear infectious, as family members have had similar symptoms over the last few days.  Will discharge with symptomatic treatment.   Gilda Crease, MD 09/15/19 [phone]

## 2019-11-22 ENCOUNTER — Emergency Department (HOSPITAL_COMMUNITY): Payer: Medicaid Other

## 2019-11-22 ENCOUNTER — Other Ambulatory Visit: Payer: Self-pay

## 2019-11-22 ENCOUNTER — Emergency Department (HOSPITAL_COMMUNITY)
Admission: EM | Admit: 2019-11-22 | Discharge: 2019-11-22 | Disposition: A | Discharge status: Home / Self Care | Payer: Medicaid Other | Attending: Emergency Medicine | Admitting: Emergency Medicine

## 2019-11-22 ENCOUNTER — Encounter (HOSPITAL_COMMUNITY): Payer: Self-pay | Admitting: Emergency Medicine

## 2019-11-22 DIAGNOSIS — Z91040 Latex allergy status: Secondary | ICD-10-CM | POA: Diagnosis not present

## 2019-11-22 DIAGNOSIS — F17210 Nicotine dependence, cigarettes, uncomplicated: Secondary | ICD-10-CM | POA: Diagnosis not present

## 2019-11-22 DIAGNOSIS — R1031 Right lower quadrant pain: Secondary | ICD-10-CM

## 2019-11-22 DIAGNOSIS — R112 Nausea with vomiting, unspecified: Secondary | ICD-10-CM | POA: Insufficient documentation

## 2019-11-22 DIAGNOSIS — J45909 Unspecified asthma, uncomplicated: Secondary | ICD-10-CM | POA: Diagnosis not present

## 2019-11-22 LAB — COMPREHENSIVE METABOLIC PANEL
ALT: 28 U/L (ref 0–44)
AST: 22 U/L (ref 15–41)
Albumin: 3.8 g/dL (ref 3.5–5.0)
Alkaline Phosphatase: 69 U/L (ref 38–126)
Anion gap: 8 (ref 5–15)
BUN: 17 mg/dL (ref 6–20)
CO2: 22 mmol/L (ref 22–32)
Calcium: 8 mg/dL — ABNORMAL LOW (ref 8.9–10.3)
Chloride: 108 mmol/L (ref 98–111)
Creatinine, Ser: 0.77 mg/dL (ref 0.61–1.24)
GFR calc Af Amer: >60 mL/min (ref >60)
GFR calc non Af Amer: >60 mL/min (ref >60)
Glucose, Bld: 93 mg/dL (ref 70–99)
Potassium: 3.4 mmol/L — ABNORMAL LOW (ref 3.5–5.1)
Sodium: 138 mmol/L (ref 135–145)
Total Bilirubin: 0.2 mg/dL — ABNORMAL LOW (ref 0.3–1.2)
Total Protein: 6.3 g/dL — ABNORMAL LOW (ref 6.5–8.1)

## 2019-11-22 LAB — URINALYSIS, ROUTINE W REFLEX MICROSCOPIC
Bilirubin Urine: NEGATIVE
Glucose, UA: NEGATIVE mg/dL
Hgb urine dipstick: NEGATIVE
Ketones, ur: NEGATIVE mg/dL
Leukocytes,Ua: NEGATIVE
Nitrite: NEGATIVE
Protein, ur: NEGATIVE mg/dL
Specific Gravity, Urine: 1.03 (ref 1.005–1.030)
pH: 5 (ref 5.0–8.0)

## 2019-11-22 LAB — CBC
HCT: 47.9 % (ref 39.0–52.0)
Hemoglobin: 16.1 g/dL (ref 13.0–17.0)
MCH: 30.3 pg (ref 26.0–34.0)
MCHC: 33.6 g/dL (ref 30.0–36.0)
MCV: 90 fL (ref 80.0–100.0)
Platelets: 205 10*3/uL (ref 150–400)
RBC: 5.32 MIL/uL (ref 4.22–5.81)
RDW: 12.9 % (ref 11.5–15.5)
WBC: 10.5 10*3/uL (ref 4.0–10.5)
nRBC: 0 % (ref 0.0–0.2)

## 2019-11-22 LAB — LIPASE, BLOOD: Lipase: 26 U/L (ref 11–51)

## 2019-11-22 MED ORDER — SODIUM CHLORIDE 0.9% FLUSH
3.0000 mL | Freq: Once | INTRAVENOUS | Status: AC
  Administered 2019-11-22: 3 mL via INTRAVENOUS

## 2019-11-22 MED ORDER — KETOROLAC TROMETHAMINE 30 MG/ML IJ SOLN
30.0000 mg | Freq: Once | INTRAMUSCULAR | Status: AC
  Administered 2019-11-22: 30 mg via INTRAVENOUS
  Filled 2019-11-22: qty 1

## 2019-11-22 MED ORDER — DICYCLOMINE HCL 20 MG PO TABS
20.0000 mg | ORAL_TABLET | Freq: Three times a day (TID) | ORAL | 0 refills | Status: AC
Start: 2019-11-22 — End: ?

## 2019-11-22 MED ORDER — DICYCLOMINE HCL 10 MG/ML IM SOLN
20.0000 mg | Freq: Once | INTRAMUSCULAR | Status: AC
  Administered 2019-11-22: 20 mg via INTRAMUSCULAR
  Filled 2019-11-22: qty 2

## 2019-11-22 MED ORDER — ONDANSETRON HCL 4 MG PO TABS
4.0000 mg | ORAL_TABLET | Freq: Three times a day (TID) | ORAL | 0 refills | Status: DC | PRN
Start: 2019-11-22 — End: 2021-04-02

## 2019-11-22 NOTE — ED Triage Notes (Signed)
Pt c/o generalized abd pain with nausea and states he feels sob and 1 hour.

## 2019-11-22 NOTE — ED Provider Notes (Signed)
[facility] EMERGENCY DEPARTMENT Provider Note   CSN: [id number] Arrival date & time: 11/22/19  0008   Time seen 2:56 AM  History Chief Complaint  Patient presents with  . Abdominal Pain    Charles [name] is a 29 y.o. male.  HPI   Patient reports acute onset of right-sided abdominal pain that started about 11 PM tonight.  He states it felt like something was cutting him from the inside out.  He states that is in his right lower quadrant and radiates around to his right flank area.  He has had nausea and vomited 3-4 times.  He denies diarrhea, fever, hematuria.  He states the pain was initially constant now it waxes and wanes and gets achy.  He states he did not eat anything different.  Nobody else has been sick.  He has never had this pain before.  PCP Practice, [facility]   Past Medical History:  Diagnosis Date  . Asthma   . Back pain   . Rib pain     There are no problems to display for this patient.   Past Surgical History:  Procedure Laterality Date  . ADENOIDECTOMY    . TONSILLECTOMY         Family History  Problem Relation Age of Onset  . Alcohol abuse Father   . Drug abuse Father     Social History   Tobacco Use  . Smoking status: Current Every Day Smoker    Packs/day: 1.00    Years: 10.00    Pack years: 10.00    Types: Cigarettes  . Smokeless tobacco: Former Neurosurgeon    Types: Snuff  Vaping Use  . Vaping Use: Never used  Substance Use Topics  . Alcohol use: No  . Drug use: No    Comment: former quit in 2012    Home Medications Prior to Admission medications   Medication Sig Start Date End Date Taking? Authorizing Provider  acetaminophen (TYLENOL) 500 MG tablet Take 500 mg by mouth every 6 (six) hours as needed for mild pain or fever.    [provider]  dicyclomine (BENTYL) 20 MG tablet Take 1 tablet (20 mg total) by mouth 4 (four) times daily -  before meals and at bedtime. 11/22/19   Devoria Albe, MD  ondansetron (ZOFRAN) 4 MG  tablet Take 1 tablet (4 mg total) by mouth every 8 (eight) hours as needed. 11/22/19   Devoria Albe, MD    Allergies    Adhesive [tape], Amoxicillin, Codeine, Penicillins, and Latex  Review of Systems   Review of Systems  All other systems reviewed and are negative.   Physical Exam Updated Vital Signs BP 128/87 (BP Location: Right Arm)   Pulse 78   Temp 98.1 F (36.7 C) (Oral)   Resp 16   SpO2 97%   Physical Exam Vitals and nursing note reviewed.  Constitutional:      Appearance: Normal appearance. He is obese.  HENT:     Head: Normocephalic and atraumatic.     Right Ear: External ear normal.     Left Ear: External ear normal.     Nose: Nose normal.     Mouth/Throat:     Mouth: Mucous membranes are dry.     Pharynx: No oropharyngeal exudate or posterior oropharyngeal erythema.  Eyes:     Extraocular Movements: Extraocular movements intact.     Conjunctiva/sclera: Conjunctivae normal.  Cardiovascular:     Rate and Rhythm: Normal rate and regular rhythm.  Pulses: Normal pulses.     Heart sounds: Normal heart sounds. No murmur heard.   Pulmonary:     Effort: Pulmonary effort is normal. No respiratory distress.     Breath sounds: Normal breath sounds.  Abdominal:     General: Bowel sounds are normal.     Palpations: Abdomen is soft.     Tenderness: There is abdominal tenderness. There is no right CVA tenderness, left CVA tenderness, guarding or rebound.       Comments: Very minimal tenderness to palpation in the right upper quadrant but he is more tender in the right lower mid abdomen.  Musculoskeletal:        General: Normal range of motion.     Cervical back: Normal range of motion.  Skin:    General: Skin is warm and dry.     Findings: No rash.  Neurological:     General: No focal deficit present.     Mental Status: He is alert and oriented to person, place, and time.     Cranial Nerves: No cranial nerve deficit.  Psychiatric:        Mood and Affect: Mood  normal.        Behavior: Behavior normal.        Thought Content: Thought content normal.     ED Results / Procedures / Treatments   Labs (all labs ordered are listed, but only abnormal results are displayed) Results for orders placed or performed during the hospital encounter of 11/22/19  Lipase, blood  Result Value Ref Range   Lipase 26 11 - 51 U/L  Comprehensive metabolic panel  Result Value Ref Range   Sodium 138 135 - 145 mmol/L   Potassium 3.4 (L) 3.5 - 5.1 mmol/L   Chloride 108 98 - 111 mmol/L   CO2 22 22 - 32 mmol/L   Glucose, Bld 93 70 - 99 mg/dL   BUN 17 6 - 20 mg/dL   Creatinine, Ser 4.53 0.61 - 1.24 mg/dL   Calcium 8.0 (L) 8.9 - 10.3 mg/dL   Total Protein 6.3 (L) 6.5 - 8.1 g/dL   Albumin 3.8 3.5 - 5.0 g/dL   AST 22 15 - 41 U/L   ALT 28 0 - 44 U/L   Alkaline Phosphatase 69 38 - 126 U/L   Total Bilirubin 0.2 (L) 0.3 - 1.2 mg/dL   GFR calc non Af Amer >60 >60 mL/min   GFR calc Af Amer >60 >60 mL/min   Anion gap 8 5 - 15  CBC  Result Value Ref Range   WBC 10.5 4.0 - 10.5 K/uL   RBC 5.32 4.22 - 5.81 MIL/uL   Hemoglobin 16.1 13.0 - 17.0 g/dL   HCT 64.6 39 - 52 %   MCV 90.0 80.0 - 100.0 fL   MCH 30.3 26.0 - 34.0 pg   MCHC 33.6 30.0 - 36.0 g/dL   RDW 80.3 21.2 - 24.8 %   Platelets 205 150 - 400 K/uL   nRBC 0.0 0.0 - 0.2 %  Urinalysis, Routine w reflex microscopic  Result Value Ref Range   Color, Urine YELLOW YELLOW   APPearance CLEAR CLEAR   Specific Gravity, Urine 1.030 1.005 - 1.030   pH 5.0 5.0 - 8.0   Glucose, UA NEGATIVE NEGATIVE mg/dL   Hgb urine dipstick NEGATIVE NEGATIVE   Bilirubin Urine NEGATIVE NEGATIVE   Ketones, ur NEGATIVE NEGATIVE mg/dL   Protein, ur NEGATIVE NEGATIVE mg/dL   Nitrite NEGATIVE NEGATIVE   Leukocytes,Ua NEGATIVE  NEGATIVE   Laboratory interpretation all normal except low calcium c/w  low protein    EKG None  Radiology CT Renal Stone Study  Result Date: 11/22/2019 CLINICAL DATA:  Generalized abdominal pain. EXAM: CT  ABDOMEN AND PELVIS WITHOUT CONTRAST TECHNIQUE: Multidetector CT imaging of the abdomen and pelvis was performed following the standard protocol without IV contrast. COMPARISON:  May 30, 2018 FINDINGS: Lower chest: There is stable pulmonary nodules at the lung bases bilaterally.The heart size is normal. Hepatobiliary: There is decreased hepatic attenuation suggestive of hepatic steatosis. Normal gallbladder.There is no biliary ductal dilation. Pancreas: Normal contours without ductal dilatation. No peripancreatic fluid collection. Spleen: Unremarkable. Adrenals/Urinary Tract: --Adrenal glands: Unremarkable. --Right kidney/ureter: There is a punctate nonobstructing stone in the lower pole the right kidney. --Left kidney/ureter: There is a questionable nonobstructing stone in the upper pole the left kidney. --Urinary bladder: Unremarkable. Stomach/Bowel: --Stomach/Duodenum: No hiatal hernia or other gastric abnormality. Normal duodenal course and caliber. --Small bowel: Unremarkable. --Colon: Unremarkable. --Appendix: Normal. Vascular/Lymphatic: Normal course and caliber of the major abdominal vessels. --No retroperitoneal lymphadenopathy. --No mesenteric lymphadenopathy. --No pelvic or inguinal lymphadenopathy. Reproductive: Unremarkable Other: No ascites or free air. The abdominal wall is normal. Musculoskeletal. No acute displaced fractures. IMPRESSION: 1. No acute abdominopelvic abnormality. 2. Hepatic steatosis. 3. There is small punctate bilateral nonobstructing nephroliths. Electronically Signed   By: Katherine Mantle M.D.   On: 11/22/2019 03:48    Procedures Procedures (including critical care time)  Medications Ordered in ED Medications  sodium chloride flush (NS) 0.9 % injection 3 mL (3 mLs Intravenous Given 11/22/19 0148)  ketorolac (TORADOL) 30 MG/ML injection 30 mg (30 mg Intravenous Given 11/22/19 0325)  dicyclomine (BENTYL) injection 20 mg (20 mg Intramuscular Given 11/22/19 0327)    ED  Course  I have reviewed the triage vital signs and the nursing notes.  Pertinent labs & imaging results that were available during my care of the patient were reviewed by me and considered in my medical decision making (see chart for details).    MDM Rules/Calculators/A&P                          Patient was given Toradol for pain and Bentyl for intestinal spasms.  CT was done to look for renal stone, appendicitis, gallstones.  He said he did not need anything for nausea at this time.  All of patient's tests are normal.  When rechecked prior to discharge he states his pain is better.  He was discharged home.   Final Clinical Impression(s) / ED Diagnoses Final diagnoses:  Right lower quadrant abdominal pain    Rx / DC Orders ED Discharge Orders         Ordered    ondansetron (ZOFRAN) 4 MG tablet  Every 8 hours PRN     Discontinue  Reprint     11/22/19 0404    dicyclomine (BENTYL) 20 MG tablet  3 times daily before meals & bedtime     Discontinue  Reprint     11/22/19 0404         Plan discharge  Devoria Albe, MD, Concha Pyo, MD 11/22/19 [phone]

## 2019-11-22 NOTE — Discharge Instructions (Addendum)
Drink plenty of fluids (clear liquids) then start a bland diet later this morning such as toast, crackers, jello, [employer] chicken noodle soup. Use the zofran for nausea or vomiting. Take imodium OTC for diarrhea. Avoid milk products until the diarrhea is gone.  Take the Bentyl as needed for abdominal pain.

## 2019-12-08 ENCOUNTER — Other Ambulatory Visit: Payer: Self-pay

## 2019-12-08 ENCOUNTER — Emergency Department (HOSPITAL_COMMUNITY)
Admission: EM | Admit: 2019-12-08 | Discharge: 2019-12-08 | Disposition: A | Discharge status: Home / Self Care | Payer: Medicaid Other | Attending: Emergency Medicine | Admitting: Emergency Medicine

## 2019-12-08 ENCOUNTER — Encounter (HOSPITAL_COMMUNITY): Payer: Self-pay | Admitting: *Deleted

## 2019-12-08 DIAGNOSIS — Z91040 Latex allergy status: Secondary | ICD-10-CM | POA: Diagnosis not present

## 2019-12-08 DIAGNOSIS — R519 Headache, unspecified: Secondary | ICD-10-CM | POA: Insufficient documentation

## 2019-12-08 DIAGNOSIS — J45909 Unspecified asthma, uncomplicated: Secondary | ICD-10-CM | POA: Diagnosis not present

## 2019-12-08 DIAGNOSIS — F17210 Nicotine dependence, cigarettes, uncomplicated: Secondary | ICD-10-CM | POA: Diagnosis not present

## 2019-12-08 MED ORDER — CLINDAMYCIN HCL 150 MG PO CAPS
300.0000 mg | ORAL_CAPSULE | Freq: Once | ORAL | Status: AC
  Administered 2019-12-08: 300 mg via ORAL
  Filled 2019-12-08: qty 2

## 2019-12-08 MED ORDER — HYDROCODONE-ACETAMINOPHEN 5-325 MG PO TABS
1.0000 | ORAL_TABLET | Freq: Once | ORAL | Status: AC
  Administered 2019-12-08: 1 via ORAL
  Filled 2019-12-08: qty 1

## 2019-12-08 MED ORDER — IBUPROFEN 400 MG PO TABS
600.0000 mg | ORAL_TABLET | Freq: Once | ORAL | Status: AC
  Administered 2019-12-08: 600 mg via ORAL
  Filled 2019-12-08: qty 2

## 2019-12-08 NOTE — ED Triage Notes (Signed)
Right facial swelling onset this am

## 2019-12-08 NOTE — ED Notes (Signed)
Signature pad not working. Pt rec'd d/c paper

## 2019-12-08 NOTE — Discharge Instructions (Addendum)
Take ibuprofen 600 mg every 6 hours as needed for pain. Follow-up with your PCP or dentist next week if you symptoms are not improving.

## 2019-12-10 NOTE — ED Provider Notes (Signed)
[facility] EMERGENCY DEPARTMENT Provider Note   CSN: [id number] Arrival date & time: 12/08/19  1750     History Chief Complaint  Patient presents with  . Facial Pain    Charles [name] is a 29 y.o. male.  HPI   29yM with R facial pain. Noticed this morning. Swelling and pain worsening through the day. Pain under the R eye and R cheek. No toothache. No neck pain or sore throat. No ear pain. No fever.   Past Medical History:  Diagnosis Date  . Asthma   . Back pain   . Rib pain     There are no problems to display for this patient.   Past Surgical History:  Procedure Laterality Date  . ADENOIDECTOMY    . TONSILLECTOMY         Family History  Problem Relation Age of Onset  . Alcohol abuse Father   . Drug abuse Father     Social History   Tobacco Use  . Smoking status: Current Every Day Smoker    Packs/day: 1.00    Years: 10.00    Pack years: 10.00    Types: Cigarettes  . Smokeless tobacco: Former Neurosurgeon    Types: Snuff  Vaping Use  . Vaping Use: Never used  Substance Use Topics  . Alcohol use: No  . Drug use: No    Comment: former quit in 2012    Home Medications Prior to Admission medications   Medication Sig Start Date End Date Taking? Authorizing Provider  acetaminophen (TYLENOL) 500 MG tablet Take 500 mg by mouth every 6 (six) hours as needed for mild pain or fever.    [provider]  dicyclomine (BENTYL) 20 MG tablet Take 1 tablet (20 mg total) by mouth 4 (four) times daily -  before meals and at bedtime. 11/22/19   Devoria Albe, MD  ondansetron (ZOFRAN) 4 MG tablet Take 1 tablet (4 mg total) by mouth every 8 (eight) hours as needed. 11/22/19   Devoria Albe, MD    Allergies    Adhesive [tape], Amoxicillin, Codeine, Penicillins, and Latex  Review of Systems   Review of Systems All systems reviewed and negative, other than as noted in HPI.  Physical Exam Updated Vital Signs BP 125/87   Pulse 99   Temp 98.8 F (37.1 C)   Resp 20   Ht  5\' 5"  (1.651 m)   Wt 114.8 kg   SpO2 98%   BMI 42.10 kg/m   Physical Exam Vitals and nursing note reviewed.  Constitutional:      General: He is not in acute distress.    Appearance: He is well-developed.  HENT:     Head: Normocephalic and atraumatic.     Comments: TTP R malar region. Perhaps some mild R facial fullness. If he didn't specifically point it out I don't think I would have noticed it.  Eyes:     General:        Right eye: No discharge.        Left eye: No discharge.     Conjunctiva/sclera: Conjunctivae normal.  Cardiovascular:     Rate and Rhythm: Normal rate and regular rhythm.     Heart sounds: Normal heart sounds. No murmur heard.  No friction rub. No gallop.   Pulmonary:     Effort: Pulmonary effort is normal. No respiratory distress.     Breath sounds: Normal breath sounds.  Abdominal:     General: There is no distension.  Palpations: Abdomen is soft.     Tenderness: There is no abdominal tenderness.  Musculoskeletal:        General: No tenderness.     Cervical back: Neck supple.  Skin:    General: Skin is warm and dry.  Neurological:     Mental Status: He is alert.  Psychiatric:        Behavior: Behavior normal.        Thought Content: Thought content normal.     ED Results / Procedures / Treatments   Labs (all labs ordered are listed, but only abnormal results are displayed) Labs Reviewed - No data to display  EKG None  Radiology No results found.  Procedures Procedures (including critical care time)  Medications Ordered in ED Medications  clindamycin (CLEOCIN) capsule 300 mg (300 mg Oral Given 12/08/19 2050)  HYDROcodone-acetaminophen (NORCO/VICODIN) 5-325 MG per tablet 1 tablet (1 tablet Oral Given 12/08/19 2050)  ibuprofen (ADVIL) tablet 600 mg (600 mg Oral Given 12/08/19 2050)    ED Course  I have reviewed the triage vital signs and the nursing notes.  Pertinent labs & imaging results that were available during my care of the  patient were reviewed by me and considered in my medical decision making (see chart for details).    MDM Rules/Calculators/A&P   29yM with R facial pain/swelling. Maxillary sinusitis? Not convinced odontologic. Seems too anterior be parotitis. Doubt septal cellulitis. Clindamycin since it will cover for many potential causes. Return precautions discussed.   Final Clinical Impression(s) / ED Diagnoses Final diagnoses:  Right sided facial pain    Rx / DC Orders ED Discharge Orders    None       Raeford Razor, MD 12/10/19 Serena Croissant

## 2020-01-27 ENCOUNTER — Encounter (HOSPITAL_COMMUNITY): Payer: Self-pay | Admitting: *Deleted

## 2020-01-27 ENCOUNTER — Emergency Department (HOSPITAL_COMMUNITY)
Admission: EM | Admit: 2020-01-27 | Discharge: 2020-01-27 | Disposition: A | Discharge status: Home / Self Care | Payer: Medicaid Other | Attending: Emergency Medicine | Admitting: Emergency Medicine

## 2020-01-27 ENCOUNTER — Other Ambulatory Visit: Payer: Self-pay

## 2020-01-27 DIAGNOSIS — M791 Myalgia, unspecified site: Secondary | ICD-10-CM | POA: Diagnosis present

## 2020-01-27 DIAGNOSIS — J069 Acute upper respiratory infection, unspecified: Secondary | ICD-10-CM | POA: Diagnosis not present

## 2020-01-27 DIAGNOSIS — J45909 Unspecified asthma, uncomplicated: Secondary | ICD-10-CM | POA: Insufficient documentation

## 2020-01-27 DIAGNOSIS — Z91040 Latex allergy status: Secondary | ICD-10-CM | POA: Diagnosis not present

## 2020-01-27 DIAGNOSIS — R519 Headache, unspecified: Secondary | ICD-10-CM | POA: Diagnosis not present

## 2020-01-27 DIAGNOSIS — J4521 Mild intermittent asthma with (acute) exacerbation: Secondary | ICD-10-CM

## 2020-01-27 DIAGNOSIS — Z20822 Contact with and (suspected) exposure to covid-19: Secondary | ICD-10-CM | POA: Insufficient documentation

## 2020-01-27 DIAGNOSIS — F17210 Nicotine dependence, cigarettes, uncomplicated: Secondary | ICD-10-CM | POA: Insufficient documentation

## 2020-01-27 LAB — RESPIRATORY PANEL BY RT PCR (FLU A&B, COVID)
Influenza A by PCR: NEGATIVE
Influenza B by PCR: NEGATIVE
SARS Coronavirus 2 by RT PCR: NEGATIVE

## 2020-01-27 MED ORDER — IBUPROFEN 800 MG PO TABS
800.0000 mg | ORAL_TABLET | Freq: Once | ORAL | Status: AC
  Administered 2020-01-27: 800 mg via ORAL
  Filled 2020-01-27: qty 1

## 2020-01-27 MED ORDER — BENZONATATE 200 MG PO CAPS
200.0000 mg | ORAL_CAPSULE | Freq: Three times a day (TID) | ORAL | 0 refills | Status: DC | PRN
Start: 2020-01-27 — End: 2021-04-02

## 2020-01-27 MED ORDER — IBUPROFEN 600 MG PO TABS
600.0000 mg | ORAL_TABLET | Freq: Four times a day (QID) | ORAL | 0 refills | Status: AC | PRN
Start: 2020-01-27 — End: ?

## 2020-01-27 MED ORDER — ALBUTEROL SULFATE HFA 108 (90 BASE) MCG/ACT IN AERS
2.0000 | INHALATION_SPRAY | Freq: Once | RESPIRATORY_TRACT | Status: AC
  Administered 2020-01-27: 2 via RESPIRATORY_TRACT
  Filled 2020-01-27: qty 6.7

## 2020-01-27 MED ORDER — HYDROCODONE-ACETAMINOPHEN 5-325 MG PO TABS
1.0000 | ORAL_TABLET | Freq: Once | ORAL | Status: AC
  Administered 2020-01-27: 1 via ORAL
  Filled 2020-01-27: qty 1

## 2020-01-27 MED ORDER — PREDNISONE 10 MG PO TABS
ORAL_TABLET | ORAL | 0 refills | Status: DC
Start: 2020-01-27 — End: 2021-04-02

## 2020-01-27 NOTE — Discharge Instructions (Addendum)
Your Covid and flu test today were negative.  You likely have a viral respiratory infection.  This may also be aggravating your asthma.  Use the albuterol inhaler 1 to 2 puffs every 4-6 hours as needed.  Drink plenty of fluids.  You may also take Tylenol every 4 hours if needed for fever and/or body aches.  Take the prednisone as prescribed until its finished.  Follow-up with your primary doctor for recheck

## 2020-01-27 NOTE — ED Triage Notes (Signed)
Pt c/o sinus infection, headache, sore throat, nausea for the past few days,

## 2020-01-27 NOTE — ED Provider Notes (Signed)
[facility] EMERGENCY DEPARTMENT Provider Note   CSN: [id number] Arrival date & time: 01/27/20  0900     History Chief Complaint  Patient presents with  . Nasal Congestion    Charles [name] is a 29 y.o. male.  HPI      Charles [name] is a 29 y.o. male with past medical history of asthma, who presents to the Emergency Department complaining of generalized body aches, frontal headache of gradual onset, sinus pressure and pain, sneezing rhinorrhea, and nausea.  Symptoms have been present for 2 days.  He also complains of intermittent cough, but denies chest pain or shortness of breath.  He has been out of his albuterol inhaler for several months.  Intermittent wheezing.  He states that he has trouble with his allergies this time of year.  He denies fever or chills, abdominal pain, neck pain or stiffness.  He has tried over-the-counter allergy and cold medication without relief.  Nothing makes his symptoms better or worse.  He has been fully vaccinated for Covid.    Past Medical History:  Diagnosis Date  . Asthma   . Back pain   . Rib pain     There are no problems to display for this patient.   Past Surgical History:  Procedure Laterality Date  . ADENOIDECTOMY    . TONSILLECTOMY         Family History  Problem Relation Age of Onset  . Alcohol abuse Father   . Drug abuse Father     Social History   Tobacco Use  . Smoking status: Current Every Day Smoker    Packs/day: 1.00    Years: 10.00    Pack years: 10.00    Types: Cigarettes  . Smokeless tobacco: Former Neurosurgeon    Types: Snuff  Vaping Use  . Vaping Use: Never used  Substance Use Topics  . Alcohol use: No  . Drug use: No    Comment: former quit in 2012    Home Medications Prior to Admission medications   Medication Sig Start Date End Date Taking? Authorizing Provider  acetaminophen (TYLENOL) 500 MG tablet Take 500 mg by mouth every 6 (six) hours as needed for mild pain or fever.    [provider]  dicyclomine (BENTYL) 20 MG tablet Take 1 tablet (20 mg total) by mouth 4 (four) times daily -  before meals and at bedtime. 11/22/19   Devoria Albe, MD  ondansetron (ZOFRAN) 4 MG tablet Take 1 tablet (4 mg total) by mouth every 8 (eight) hours as needed. 11/22/19   Devoria Albe, MD    Allergies    Adhesive [tape], Amoxicillin, Codeine, Penicillins, and Latex  Review of Systems   Review of Systems  Constitutional: Negative for appetite change, chills and fever.  HENT: Positive for congestion, rhinorrhea, sinus pressure and sinus pain. Negative for ear pain, sore throat and trouble swallowing.   Eyes: Negative for visual disturbance.  Respiratory: Positive for cough and wheezing. Negative for chest tightness and shortness of breath.   Cardiovascular: Negative for chest pain.  Gastrointestinal: Positive for nausea. Negative for abdominal pain, diarrhea and vomiting.  Genitourinary: Negative for decreased urine volume and dysuria.  Musculoskeletal: Positive for myalgias. Negative for arthralgias, neck pain and neck stiffness.  Skin: Negative for rash.  Neurological: Positive for headaches. Negative for dizziness, weakness and numbness.  Hematological: Negative for adenopathy.    Physical Exam Updated Vital Signs BP (!) 120/99   Pulse (!) 104   Temp  98.8 F (37.1 C) (Oral)   Resp 16   Ht 5\' 5"  (1.651 m)   Wt 117.5 kg   SpO2 98%   BMI 43.10 kg/m   Physical Exam Vitals and nursing note reviewed.  Constitutional:      General: He is not in acute distress.    Appearance: Normal appearance. He is well-developed. He is not ill-appearing.  HENT:     Head:     Jaw: No trismus.     Right Ear: Tympanic membrane and ear canal normal.     Left Ear: Tympanic membrane and ear canal normal.     Nose: Mucosal edema, congestion and rhinorrhea present.     Mouth/Throat:     Mouth: Mucous membranes are moist.     Pharynx: Uvula midline. No oropharyngeal exudate, posterior oropharyngeal  erythema or uvula swelling.     Tonsils: No tonsillar abscesses.  Eyes:     Conjunctiva/sclera: Conjunctivae normal.  Neck:     Trachea: Phonation normal.     Meningeal: Kernig's sign absent.  Cardiovascular:     Rate and Rhythm: Normal rate and regular rhythm.     Pulses: Normal pulses.  Pulmonary:     Effort: Pulmonary effort is normal. No respiratory distress.     Breath sounds: Wheezing present. No rales.     Comments: Scattered expiratory wheezes bilaterally.  No rales or rhonchi. Abdominal:     General: There is no distension.     Palpations: Abdomen is soft.     Tenderness: There is no abdominal tenderness. There is no right CVA tenderness, left CVA tenderness or guarding.  Musculoskeletal:        General: Normal range of motion.     Cervical back: Normal range of motion and neck supple.  Lymphadenopathy:     Cervical: No cervical adenopathy.  Skin:    General: Skin is warm.     Findings: No rash.  Neurological:     General: No focal deficit present.     Mental Status: He is alert.     Sensory: No sensory deficit.     Motor: No weakness or abnormal muscle tone.     Coordination: Coordination normal.     ED Results / Procedures / Treatments   Labs (all labs ordered are listed, but only abnormal results are displayed) Labs Reviewed  RESPIRATORY PANEL BY RT PCR (FLU A&B, COVID)    EKG None  Radiology No results found.  Procedures Procedures (including critical care time)  Medications Ordered in ED Medications - No data to display  ED Course  I have reviewed the triage vital signs and the nursing notes.  Pertinent labs & imaging results that were available during my care of the patient were reviewed by me and considered in my medical decision making (see chart for details).    MDM Rules/Calculators/A&P                          Patient here with symptoms of sinus pressure and pain, intermittent cough, sneezing and rhinorrhea.  Symptoms are concerning  for Covid.  He has been fully vaccinated.  Headache of gradual onset and frontal in location.  No meningeal signs.  Respiratory panel swab negative for flu  and Covid.  Symptoms likely viral although he may also have mild exacerbation of his asthma.  Vital signs reviewed, no hypoxia, no increased work of breathing.  Will dispense albuterol MDI, prescription written for antitussive medication  and prednisone taper.  He appears appropriate for discharge home, agrees to close outpatient follow-up   Final Clinical Impression(s) / ED Diagnoses Final diagnoses:  Upper respiratory tract infection, unspecified type  Mild intermittent asthma with acute exacerbation    Rx / DC Orders ED Discharge Orders    None       Pauline Aus, PA-C 01/27/20 1108    Jacalyn Lefevre, MD 01/27/20 1445

## 2021-03-06 ENCOUNTER — Ambulatory Visit
Admission: EM | Admit: 2021-03-06 | Discharge: 2021-03-06 | Discharge status: Absent without leave | Payer: Medicaid Other

## 2021-03-06 ENCOUNTER — Ambulatory Visit
Admission: EM | Admit: 2021-03-06 | Discharge: 2021-03-06 | Disposition: A | Discharge status: Home / Self Care | Payer: Medicaid Other

## 2021-03-06 ENCOUNTER — Encounter: Payer: Self-pay | Admitting: Emergency Medicine

## 2021-03-06 ENCOUNTER — Other Ambulatory Visit: Payer: Self-pay

## 2021-03-06 DIAGNOSIS — J069 Acute upper respiratory infection, unspecified: Secondary | ICD-10-CM

## 2021-03-06 DIAGNOSIS — Z20822 Contact with and (suspected) exposure to covid-19: Secondary | ICD-10-CM

## 2021-03-06 DIAGNOSIS — J4521 Mild intermittent asthma with (acute) exacerbation: Secondary | ICD-10-CM

## 2021-03-06 HISTORY — DX: Type 2 diabetes mellitus without complications: E11.9

## 2021-03-06 MED ORDER — PREDNISONE 20 MG PO TABS
40.0000 mg | ORAL_TABLET | Freq: Every day | ORAL | 0 refills | Status: DC
Start: 2021-03-06 — End: 2021-04-02

## 2021-03-06 MED ORDER — ALBUTEROL SULFATE HFA 108 (90 BASE) MCG/ACT IN AERS: 1.0000 | INHALATION_SPRAY | Freq: Four times a day (QID) | RESPIRATORY_TRACT | 0 refills | Status: DC | PRN

## 2021-03-06 NOTE — ED Triage Notes (Signed)
Exposed to covid.  Nasal congestion, sore throat, taste and smell come and go.  Fever off and on.  Diarrhea.  Symptoms x 2 days.

## 2021-03-07 ENCOUNTER — Ambulatory Visit: Payer: Self-pay

## 2021-03-07 LAB — COVID-19, FLU A+B AND RSV
Influenza A, NAA: NOT DETECTED
Influenza B, NAA: NOT DETECTED
RSV, NAA: NOT DETECTED
SARS-CoV-2, NAA: NOT DETECTED

## 2021-03-10 NOTE — ED Provider Notes (Signed)
[facility]    CSN: [id number] Arrival date & time: 03/06/21  1458      History   Chief Complaint No chief complaint on file.   HPI Charles [name] is a 30 y.o. male.   Presenting today with 2 day history of nasal congestion, cough, chest tightness, wheezing, sore throat, loss of taste and smell, intermittent fevers, diarrhea. States he had a recent exposure to COVID and requests to be tested. Denies CP, SOB, N/V, rashes. Multiple sick contacts in the home as well. Hx of asthma, does not currently have albuterol inhaler at home. Current cigarette smoker.    Past Medical History:  Diagnosis Date   Asthma    Back pain    Diabetes mellitus without complication ([place])    Rib pain     There are no problems to display for this patient.   Past Surgical History:  Procedure Laterality Date   ADENOIDECTOMY     TONSILLECTOMY         Home Medications    Prior to Admission medications   Medication Sig Start Date End Date Taking? Authorizing Provider  albuterol (VENTOLIN HFA) 108 (90 Base) MCG/ACT inhaler Inhale 1-2 puffs into the lungs every 6 (six) hours as needed for wheezing or shortness of breath. 03/06/21  Yes Volney American, PA-C  metFORMIN (GLUCOPHAGE) 500 MG tablet Take by mouth 2 (two) times daily with a meal.   Yes [provider]  predniSONE (DELTASONE) 20 MG tablet Take 2 tablets (40 mg total) by mouth daily with breakfast. 03/06/21  Yes Volney American, PA-C  acetaminophen (TYLENOL) 500 MG tablet Take 500 mg by mouth every 6 (six) hours as needed for mild pain or fever.    [provider]  benzonatate (TESSALON) 200 MG capsule Take 1 capsule (200 mg total) by mouth 3 (three) times daily as needed for cough. Swallow whole, do not chew 01/27/20   Triplett, Tammy, PA-C  dicyclomine (BENTYL) 20 MG tablet Take 1 tablet (20 mg total) by mouth 4 (four) times daily -  before meals and at bedtime. 11/22/19   Rolland Porter, MD  ibuprofen  (ADVIL) 600 MG tablet Take 1 tablet (600 mg total) by mouth every 6 (six) hours as needed. Take with food 01/27/20   Triplett, Tammy, PA-C  ondansetron (ZOFRAN) 4 MG tablet Take 1 tablet (4 mg total) by mouth every 8 (eight) hours as needed. 11/22/19   Rolland Porter, MD  predniSONE (DELTASONE) 10 MG tablet Take 6 tablets day one, 5 tablets day two, 4 tablets day three, 3 tablets day four, 2 tablets day five, then 1 tablet day six 01/27/20   Triplett, Tammy, PA-C    Family History Family History  Problem Relation Age of Onset   Alcohol abuse Father    Drug abuse Father     Social History Social History   Tobacco Use   Smoking status: Every Day    Packs/day: 1.00    Years: 10.00    Pack years: 10.00    Types: Cigarettes   Smokeless tobacco: Former    Types: Snuff  Vaping Use   Vaping Use: Never used  Substance Use Topics   Alcohol use: No   Drug use: No    Comment: former quit in 2012     Allergies   Adhesive [tape], Amoxicillin, Codeine, Penicillins, and Latex   Review of Systems Review of Systems PER HPI   Physical Exam Triage Vital Signs ED Triage Vitals  Enc  Vitals Group     BP 03/06/21 1649 129/83     Pulse Rate 03/06/21 1649 91     Resp 03/06/21 1649 18     Temp 03/06/21 1649 (!) 97.4 F (36.3 C)     Temp Source 03/06/21 1649 Temporal     SpO2 03/06/21 1649 96 %     Weight --      Height --      Head Circumference --      Peak Flow --      Pain Score 03/06/21 1651 7     Pain Loc --      Pain Edu? --      Excl. in GC? --    No data found.  Updated Vital Signs BP 129/83 (BP Location: Left Arm)   Pulse 91   Temp (!) 97.4 F (36.3 C) (Temporal)   Resp 18   SpO2 96%   Visual Acuity Right Eye Distance:   Left Eye Distance:   Bilateral Distance:    Right Eye Near:   Left Eye Near:    Bilateral Near:     Physical Exam Vitals and nursing note reviewed.  Constitutional:      Appearance: Normal appearance.  HENT:     Head: Atraumatic.     Right  Ear: Tympanic membrane normal.     Left Ear: Tympanic membrane normal.     Nose: Rhinorrhea present.     Mouth/Throat:     Mouth: Mucous membranes are moist.     Pharynx: Oropharynx is clear. Posterior oropharyngeal erythema present.  Eyes:     Extraocular Movements: Extraocular movements intact.     Conjunctiva/sclera: Conjunctivae normal.  Cardiovascular:     Rate and Rhythm: Normal rate and regular rhythm.  Pulmonary:     Effort: Pulmonary effort is normal. No respiratory distress.     Breath sounds: Wheezing present. No rales.     Comments: Minimal scattered wheezes b/l  Abdominal:     General: Bowel sounds are normal. There is no distension.     Palpations: Abdomen is soft.     Tenderness: There is no abdominal tenderness. There is no guarding.  Musculoskeletal:        General: Normal range of motion.     Cervical back: Normal range of motion and neck supple.  Skin:    General: Skin is warm and dry.  Neurological:     General: No focal deficit present.     Mental Status: He is oriented to person, place, and time.  Psychiatric:        Mood and Affect: Mood normal.        Thought Content: Thought content normal.        Judgment: Judgment normal.     UC Treatments / Results  Labs (all labs ordered are listed, but only abnormal results are displayed) Labs Reviewed  COVID-19, FLU A+B AND RSV   Narrative:    Performed at:  [address] [place], [place]  [id number] Lab Director: Jolene Schimke MD, Phone:  [phone]  COVID-19, FLU A+B NAA    EKG   Radiology No results found.  Procedures Procedures (including critical care time)  Medications Ordered in UC Medications - No data to display  Initial Impression / Assessment and Plan / UC Course  I have reviewed the triage vital signs and the nursing notes.  Pertinent labs & imaging results that were available during my care of the patient were reviewed  by me and considered in my medical  decision making (see chart for details).     Vitals and exam overall reassuring, will treat with prednisone, albuterol for asthma exacerbation secondary to viral  URI and await COVID, flu and RSV testing. Discussed supportive home care, quarantine protocol and return precautions.   Final Clinical Impressions(s) / UC Diagnoses   Final diagnoses:  Exposure to COVID-19 virus  Viral URI with cough  Mild intermittent asthma with acute exacerbation   Discharge Instructions   None    ED Prescriptions     Medication Sig Dispense Auth. Provider   predniSONE (DELTASONE) 20 MG tablet Take 2 tablets (40 mg total) by mouth daily with breakfast. 10 tablet Volney American, PA-C   albuterol (VENTOLIN HFA) 108 (90 Base) MCG/ACT inhaler Inhale 1-2 puffs into the lungs every 6 (six) hours as needed for wheezing or shortness of breath. 18 g Volney American, [place]      PDMP not reviewed this encounter.   Volney American, [place] 03/10/21 0602

## 2021-04-02 ENCOUNTER — Encounter: Payer: Self-pay | Admitting: Emergency Medicine

## 2021-04-02 ENCOUNTER — Ambulatory Visit
Admission: EM | Admit: 2021-04-02 | Discharge: 2021-04-02 | Disposition: A | Discharge status: Home / Self Care | Payer: Medicaid Other | Attending: Urgent Care | Admitting: Urgent Care

## 2021-04-02 ENCOUNTER — Ambulatory Visit (INDEPENDENT_AMBULATORY_CARE_PROVIDER_SITE_OTHER): Payer: Medicaid Other

## 2021-04-02 ENCOUNTER — Other Ambulatory Visit: Payer: Self-pay

## 2021-04-02 DIAGNOSIS — B349 Viral infection, unspecified: Secondary | ICD-10-CM

## 2021-04-02 DIAGNOSIS — R0781 Pleurodynia: Secondary | ICD-10-CM

## 2021-04-02 DIAGNOSIS — R0602 Shortness of breath: Secondary | ICD-10-CM

## 2021-04-02 DIAGNOSIS — R051 Acute cough: Secondary | ICD-10-CM

## 2021-04-02 DIAGNOSIS — Z20822 Contact with and (suspected) exposure to covid-19: Secondary | ICD-10-CM

## 2021-04-02 DIAGNOSIS — F17200 Nicotine dependence, unspecified, uncomplicated: Secondary | ICD-10-CM

## 2021-04-02 DIAGNOSIS — R079 Chest pain, unspecified: Secondary | ICD-10-CM

## 2021-04-02 DIAGNOSIS — J4530 Mild persistent asthma, uncomplicated: Secondary | ICD-10-CM

## 2021-04-02 MED ORDER — CETIRIZINE HCL 10 MG PO TABS
10.0000 mg | ORAL_TABLET | Freq: Every day | ORAL | 0 refills | Status: DC
Start: 2021-04-02 — End: 2021-04-09

## 2021-04-02 MED ORDER — PROMETHAZINE-DM 6.25-15 MG/5ML PO SYRP
5.0000 mL | ORAL_SOLUTION | Freq: Every evening | ORAL | 0 refills | Status: DC | PRN
Start: 2021-04-02 — End: 2021-07-09

## 2021-04-02 MED ORDER — BENZONATATE 100 MG PO CAPS: 100.0000 mg | ORAL_CAPSULE | Freq: Three times a day (TID) | ORAL | 0 refills | Status: DC | PRN

## 2021-04-02 MED ORDER — ALBUTEROL SULFATE HFA 108 (90 BASE) MCG/ACT IN AERS: 1.0000 | INHALATION_SPRAY | Freq: Four times a day (QID) | RESPIRATORY_TRACT | 0 refills | Status: DC | PRN

## 2021-04-02 MED ORDER — PREDNISONE 20 MG PO TABS
ORAL_TABLET | ORAL | 0 refills | Status: DC
Start: 2021-04-02 — End: 2021-04-09
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
## 2021-04-02 NOTE — ED Triage Notes (Signed)
Nasal and chest congestion, chills, can't taste or smell, feels SOB.  Symptoms started on Monday.  Has been using nyquil to help with symptoms.
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
## 2021-04-02 NOTE — Discharge Instructions (Signed)
We will notify you of your test results as they arrive and may take between 48-72 hours.  I encourage you to sign up for MyChart if you have not already done so as this can be the easiest way for us to communicate results to you online or through a phone app.  Generally, we only contact you if it is a positive test result.  In the meantime, if you develop worsening symptoms including fever, chest pain, shortness of breath despite our current treatment plan then please report to the emergency room as this may be a sign of worsening status from possible viral infection.  Otherwise, we will manage this as a viral syndrome. For sore throat or cough try using a honey-based tea. Use 3 teaspoons of honey with juice squeezed from half lemon. Place shaved pieces of ginger into 1/2-1 cup of water and warm over stove top. Then mix the ingredients and repeat every 4 hours as needed. Please take Tylenol 500mg-650mg every 6 hours for aches and pains, fevers. Hydrate very well with at least 2 liters of water. Eat light meals such as soups to replenish electrolytes and soft fruits, veggies. Start an antihistamine like Zyrtec for postnasal drainage, sinus congestion.   

## 2021-04-03 LAB — COVID-19, FLU A+B NAA
Influenza A, NAA: DETECTED — AB
Influenza B, NAA: NOT DETECTED
SARS-CoV-2, NAA: NOT DETECTED

## 2021-04-09 ENCOUNTER — Ambulatory Visit
Admission: RE | Admit: 2021-04-09 | Discharge: 2021-04-09 | Disposition: A | Discharge status: Home / Self Care | Payer: Medicaid Other | Source: Ambulatory Visit | Attending: Emergency Medicine | Admitting: Emergency Medicine

## 2021-04-09 ENCOUNTER — Other Ambulatory Visit: Payer: Self-pay

## 2021-04-09 VITALS — BP 113/77 | HR 104 | Temp 98.1°F | Resp 22

## 2021-04-09 DIAGNOSIS — H66002 Acute suppurative otitis media without spontaneous rupture of ear drum, left ear: Secondary | ICD-10-CM

## 2021-04-09 DIAGNOSIS — J019 Acute sinusitis, unspecified: Secondary | ICD-10-CM

## 2021-04-09 MED ORDER — FLUTICASONE PROPIONATE 50 MCG/ACT NA SUSP
2.0000 | Freq: Every day | NASAL | 0 refills | Status: DC
Start: 2021-04-09 — End: 2021-11-26

## 2021-04-09 MED ORDER — DOXYCYCLINE HYCLATE 100 MG PO CAPS
100.0000 mg | ORAL_CAPSULE | Freq: Two times a day (BID) | ORAL | 0 refills | Status: AC
Start: 2021-04-09 — End: 2021-04-19

## 2021-04-09 NOTE — Discharge Instructions (Addendum)
Finish doxycycline, even if you feel better. Flonase, Mucinex, saline nasal irrigation with a NeilMed sinus rinse and distilled water as often as you want for nasal congestion.. Follow-up with PMD as needed.

## 2021-04-09 NOTE — ED Triage Notes (Signed)
Patient was seen 04/02/2021 .  States he tested positive for flu.  Patient has taken multiple otc medicines and no improvement.  Left ear is painful and stuffy, decreased hearing

## 2021-04-09 NOTE — ED Provider Notes (Signed)
HPI  SUBJECTIVE:  Charles [name] is a 30 y.o. male who presents with decreased hearing, left ear pain, sensation of it being "clogged" starting last night.  He reports green rhinorrhea, sinus pain and pressure.  His cough is resolving.  No vertigo.  He was diagnosed with influenza A on 12/1, states that he is getting worse.  No antibiotics in the past month.  No antipyretic in the past 6 hours.  He has been taking Tylenol PM, TheraFlu, NyQuil, DayQuil without improvement in his symptoms.  No aggravating factors.  He has a past medical history of diabetes, hypertension.  PMD: [facility].  Flu a positive 12/1 per lab review  Past Medical History:  Diagnosis Date   Asthma    Back pain    Diabetes mellitus without complication (HCC)    Rib pain     Past Surgical History:  Procedure Laterality Date   ADENOIDECTOMY     TONSILLECTOMY      Family History  Problem Relation Age of Onset   Alcohol abuse Father    Drug abuse Father     Social History   Tobacco Use   Smoking status: Every Day    Packs/day: 1.00    Years: 10.00    Pack years: 10.00    Types: Cigarettes   Smokeless tobacco: Former    Types: Snuff  Vaping Use   Vaping Use: Never used  Substance Use Topics   Alcohol use: No   Drug use: No    Comment: former quit in 2012    No current facility-administered medications for this encounter.  Current Outpatient Medications:    doxycycline (VIBRAMYCIN) 100 MG capsule, Take 1 capsule (100 mg total) by mouth 2 (two) times daily for 10 days., Disp: 20 capsule, Rfl: 0   fluticasone (FLONASE) 50 MCG/ACT nasal spray, Place 2 sprays into both nostrils daily., Disp: 16 g, Rfl: 0   acetaminophen (TYLENOL) 500 MG tablet, Take 500 mg by mouth every 6 (six) hours as needed for mild pain or fever., Disp: , Rfl:    albuterol (VENTOLIN HFA) 108 (90 Base) MCG/ACT inhaler, Inhale 1-2 puffs into the lungs every 6 (six) hours as needed for wheezing or shortness of breath.,  Disp: 18 g, Rfl: 0   benzonatate (TESSALON) 100 MG capsule, Take 1-2 capsules (100-200 mg total) by mouth 3 (three) times daily as needed for cough., Disp: 60 capsule, Rfl: 0   dicyclomine (BENTYL) 20 MG tablet, Take 1 tablet (20 mg total) by mouth 4 (four) times daily -  before meals and at bedtime., Disp: 40 tablet, Rfl: 0   ibuprofen (ADVIL) 600 MG tablet, Take 1 tablet (600 mg total) by mouth every 6 (six) hours as needed. Take with food, Disp: 21 tablet, Rfl: 0   metFORMIN (GLUCOPHAGE) 500 MG tablet, Take by mouth 2 (two) times daily with a meal., Disp: , Rfl:    promethazine-dextromethorphan (PROMETHAZINE-DM) 6.25-15 MG/5ML syrup, Take 5 mLs by mouth at bedtime as needed for cough., Disp: 100 mL, Rfl: 0  Allergies  Allergen Reactions   Adhesive [Tape] Rash   Amoxicillin Anaphylaxis and Other (See Comments)    Has patient had a PCN reaction causing immediate rash, facial/tongue/throat swelling, SOB or lightheadedness with hypotension: Yes Has patient had a PCN reaction causing severe rash involving mucus membranes or skin necrosis: No Has patient had a PCN reaction that required hospitalization: No Has patient had a PCN reaction occurring within the last 10 years: Yes If all  of the above answers are "NO", then may proceed with Cephalosporin use.   Codeine Shortness Of Breath   Penicillins Anaphylaxis and Other (See Comments)    Has patient had a PCN reaction causing immediate rash, facial/tongue/throat swelling, SOB or lightheadedness with hypotension: Yes Has patient had a PCN reaction causing severe rash involving mucus membranes or skin necrosis: No Has patient had a PCN reaction that required hospitalization: No Has patient had a PCN reaction occurring within the last 10 years: Yes If all of the above answers are "NO", then may proceed with Cephalosporin use.   Latex Rash     ROS  As noted in HPI.   Physical Exam  BP 113/77 (BP Location: Right Arm) Comment (BP Location):  large cuff  Pulse (!) 104   Temp 98.1 F (36.7 C) (Oral)   Resp (!) 22   SpO2 95%   Constitutional: Well developed, well nourished, no acute distress Eyes:  EOMI, conjunctiva normal bilaterally HENT: Normocephalic, atraumatic,mucus membranes moist.  Decreased hearing left ear compared to right.  Left TM erythematous, dull.  No bulging.  No pain with traction on pinna, palpation of tragus, mastoid.  EAC normal.  Right TM normal.  Positive purulent nasal congestion.  No maxillary, frontal sinus tenderness. Respiratory: Normal inspiratory effort Cardiovascular: Normal rate GI: nondistended skin: No rash, skin intact Musculoskeletal: no deformities Neurologic: Alert & oriented x 3, no focal neuro deficits Psychiatric: Speech and behavior appropriate   ED Course   Medications - No data to display  No orders of the defined types were placed in this encounter.   No results found for this or any previous visit (from the past 24 hour(s)). No results found.  ED Clinical Impression  1. Non-recurrent acute suppurative otitis media of left ear without spontaneous rupture of tympanic membrane   2. Acute non-recurrent sinusitis, unspecified location      ED Assessment/Plan  Concern for secondary otitis media/sinusitis with recent influenza.  Patient has anaphylaxis with amoxicillin.  Will send home with doxycycline twice daily for 10 days which will cover both an ear infection and sinusitis.  Flonase, Mucinex, saline nasal irrigation.  Follow-up with PMD as needed.  Discussed labs, imaging, MDM, treatment plan, and plan for follow-up with patient. patient agrees with plan.   Meds ordered this encounter  Medications   fluticasone (FLONASE) 50 MCG/ACT nasal spray    Sig: Place 2 sprays into both nostrils daily.    Dispense:  16 g    Refill:  0   doxycycline (VIBRAMYCIN) 100 MG capsule    Sig: Take 1 capsule (100 mg total) by mouth 2 (two) times daily for 10 days.    Dispense:  20  capsule    Refill:  0      *This clinic note was created using Scientist, clinical (histocompatibility and immunogenetics). Therefore, there may be occasional mistakes despite careful proofreading.  ?    Domenick Gong, MD 04/10/21 [phone]

## 2021-04-24 ENCOUNTER — Other Ambulatory Visit: Payer: Self-pay

## 2021-04-24 ENCOUNTER — Ambulatory Visit (INDEPENDENT_AMBULATORY_CARE_PROVIDER_SITE_OTHER): Payer: Medicaid Other

## 2021-04-24 ENCOUNTER — Ambulatory Visit
Admission: EM | Admit: 2021-04-24 | Discharge: 2021-04-24 | Disposition: A | Discharge status: Home / Self Care | Payer: Medicaid Other | Attending: Physician Assistant | Admitting: Physician Assistant

## 2021-04-24 DIAGNOSIS — M25511 Pain in right shoulder: Secondary | ICD-10-CM | POA: Diagnosis not present

## 2021-04-24 DIAGNOSIS — S46911A Strain of unspecified muscle, fascia and tendon at shoulder and upper arm level, right arm, initial encounter: Secondary | ICD-10-CM | POA: Diagnosis not present

## 2021-04-24 NOTE — ED Triage Notes (Signed)
Patient states he was chasing his special needs son and he tripped and fell on his right shoulder.   Patient states that his pain goes from the shoulder and down into the ribcage area and sometimes when holding something his arm is going numb.   Patient states he took Tylenol for the pain yesterday but nothing today.

## 2021-04-24 NOTE — ED Provider Notes (Signed)
[facility]    CSN: [id number] Arrival date & time: 04/24/21  1225      History   Chief Complaint Chief Complaint  Patient presents with   Shoulder Pain    Right shoulder and arm pain    HPI Charles [name] is a 30 y.o. male.   Pt fell while trying to hold special needs child.    The history is provided by the patient. No language interpreter was used.  Shoulder Pain Location:  Shoulder Shoulder location:  R shoulder Injury: no   Pain details:    Quality:  Aching   Radiates to:  Does not radiate   Severity:  Moderate   Onset quality:  Gradual   Duration:  1 day   Timing:  Constant   Progression:  Worsening  Past Medical History:  Diagnosis Date   Asthma    Back pain    Diabetes mellitus without complication (HCC)    Rib pain     There are no problems to display for this patient.   Past Surgical History:  Procedure Laterality Date   ADENOIDECTOMY     TONSILLECTOMY         Home Medications    Prior to Admission medications   Medication Sig Start Date End Date Taking? Authorizing Provider  acetaminophen (TYLENOL) 500 MG tablet Take 500 mg by mouth every 6 (six) hours as needed for mild pain or fever.    [provider]  albuterol (VENTOLIN HFA) 108 (90 Base) MCG/ACT inhaler Inhale 1-2 puffs into the lungs every 6 (six) hours as needed for wheezing or shortness of breath. 04/02/21   Wallis BambergMani, Mario, PA-C  benzonatate (TESSALON) 100 MG capsule Take 1-2 capsules (100-200 mg total) by mouth 3 (three) times daily as needed for cough. 04/02/21   Wallis BambergMani, Mario, PA-C  dicyclomine (BENTYL) 20 MG tablet Take 1 tablet (20 mg total) by mouth 4 (four) times daily -  before meals and at bedtime. 11/22/19   Devoria AlbeKnapp, Iva, MD  fluticasone (FLONASE) 50 MCG/ACT nasal spray Place 2 sprays into both nostrils daily. 04/09/21   Domenick GongMortenson, Ashley, MD  ibuprofen (ADVIL) 600 MG tablet Take 1 tablet (600 mg total) by mouth every 6 (six) hours as needed. Take with food  01/27/20   Triplett, Tammy, PA-C  metFORMIN (GLUCOPHAGE) 500 MG tablet Take by mouth 2 (two) times daily with a meal.    [provider]  promethazine-dextromethorphan (PROMETHAZINE-DM) 6.25-15 MG/5ML syrup Take 5 mLs by mouth at bedtime as needed for cough. 04/02/21   Wallis BambergMani, Mario, PA-C    Family History Family History  Problem Relation Age of Onset   Healthy Mother    Alcohol abuse Father    Drug abuse Father     Social History Social History   Tobacco Use   Smoking status: Every Day    Packs/day: 1.00    Years: 10.00    Pack years: 10.00    Types: Cigarettes   Smokeless tobacco: Former    Types: Snuff  Vaping Use   Vaping Use: Never used  Substance Use Topics   Alcohol use: Yes    Comment: occassionally   Drug use: No    Comment: former quit in 2012     Allergies   Adhesive [tape], Amoxicillin, Codeine, Penicillins, and Latex   Review of Systems Review of Systems  All other systems reviewed and are negative.   Physical Exam Triage Vital Signs ED Triage Vitals  Enc Vitals Group  BP 04/24/21 1302 122/81     Pulse Rate 04/24/21 1302 86     Resp 04/24/21 1302 18     Temp 04/24/21 1302 98.1 F (36.7 C)     Temp Source 04/24/21 1302 Oral     SpO2 04/24/21 1302 96 %     Weight --      Height --      Head Circumference --      Peak Flow --      Pain Score 04/24/21 1259 8     Pain Loc --      Pain Edu? --      Excl. in GC? --    No data found.  Updated Vital Signs BP 122/81 (BP Location: Right Arm)    Pulse 86    Temp 98.1 F (36.7 C) (Oral)    Resp 18    SpO2 96%   Visual Acuity Right Eye Distance:   Left Eye Distance:   Bilateral Distance:    Right Eye Near:   Left Eye Near:    Bilateral Near:     Physical Exam Vitals and nursing note reviewed.  Constitutional:      Appearance: He is well-developed.  HENT:     Head: Normocephalic.  Cardiovascular:     Rate and Rhythm: Normal rate.  Pulmonary:     Effort: Pulmonary effort is  normal.  Abdominal:     General: There is no distension.  Musculoskeletal:        General: Swelling and tenderness present.     Cervical back: Normal range of motion.  Neurological:     Mental Status: He is alert and oriented to person, place, and time.  Psychiatric:        Mood and Affect: Mood normal.     UC Treatments / Results  Labs (all labs ordered are listed, but only abnormal results are displayed) Labs Reviewed - No data to display  EKG   Radiology DG Shoulder Right  Result Date: 04/24/2021 CLINICAL DATA:  Status post fall, right shoulder pain EXAM: RIGHT SHOULDER - 2+ VIEW COMPARISON:  None. FINDINGS: There is no evidence of fracture or dislocation. There is no evidence of arthropathy or other focal bone abnormality. Soft tissues are unremarkable. IMPRESSION: Negative. Electronically Signed   By: Elige Ko M.D.   On: 04/24/2021 14:07    Procedures Procedures (including critical care time)  Medications Ordered in UC Medications - No data to display  Initial Impression / Assessment and Plan / UC Course  I have reviewed the triage vital signs and the nursing notes.  Pertinent labs & imaging results that were available during my care of the patient were reviewed by me and considered in my medical decision making (see chart for details).     MDM:  xray  no fracture  Final Clinical Impressions(s) / UC Diagnoses   Final diagnoses:  Right shoulder strain, initial encounter     Discharge Instructions      Follow up with Orthopaedist if pain persist    ED Prescriptions   None    PDMP not reviewed this encounter.   Elson Areas, [place] 04/24/21 1948

## 2021-04-24 NOTE — Discharge Instructions (Signed)
Follow up with Orthopaedist if pain persist

## 2021-04-25 ENCOUNTER — Ambulatory Visit: Payer: Medicaid Other

## 2021-06-20 ENCOUNTER — Ambulatory Visit: Payer: Medicaid Other

## 2021-07-09 ENCOUNTER — Other Ambulatory Visit: Payer: Self-pay

## 2021-07-09 ENCOUNTER — Ambulatory Visit
Admission: EM | Admit: 2021-07-09 | Discharge: 2021-07-09 | Disposition: A | Discharge status: Home / Self Care | Payer: Medicaid Other | Attending: Urgent Care | Admitting: Urgent Care

## 2021-07-09 DIAGNOSIS — B349 Viral infection, unspecified: Secondary | ICD-10-CM | POA: Insufficient documentation

## 2021-07-09 DIAGNOSIS — R0989 Other specified symptoms and signs involving the circulatory and respiratory systems: Secondary | ICD-10-CM | POA: Diagnosis present

## 2021-07-09 DIAGNOSIS — R070 Pain in throat: Secondary | ICD-10-CM | POA: Diagnosis present

## 2021-07-09 DIAGNOSIS — J4530 Mild persistent asthma, uncomplicated: Secondary | ICD-10-CM | POA: Insufficient documentation

## 2021-07-09 DIAGNOSIS — R052 Subacute cough: Secondary | ICD-10-CM | POA: Diagnosis present

## 2021-07-09 LAB — POCT RAPID STREP A (OFFICE): Rapid Strep A Screen: NEGATIVE

## 2021-07-09 MED ORDER — ALBUTEROL SULFATE HFA 108 (90 BASE) MCG/ACT IN AERS: 1.0000 | INHALATION_SPRAY | Freq: Four times a day (QID) | RESPIRATORY_TRACT | 0 refills | Status: DC | PRN

## 2021-07-09 MED ORDER — PROMETHAZINE-DM 6.25-15 MG/5ML PO SYRP: 5.0000 mL | ORAL_SOLUTION | Freq: Every evening | ORAL | 0 refills | Status: DC | PRN

## 2021-07-09 MED ORDER — BENZONATATE 100 MG PO CAPS
100.0000 mg | ORAL_CAPSULE | Freq: Three times a day (TID) | ORAL | 0 refills | Status: DC | PRN
Start: 2021-07-09 — End: 2021-09-02

## 2021-07-09 MED ORDER — LEVOCETIRIZINE DIHYDROCHLORIDE 5 MG PO TABS: 5.0000 mg | ORAL_TABLET | Freq: Every evening | ORAL | 0 refills | Status: AC

## 2021-07-09 MED ORDER — PREDNISONE 50 MG PO TABS: 50.0000 mg | ORAL_TABLET | Freq: Every day | ORAL | 0 refills | Status: DC

## 2021-07-09 NOTE — ED Provider Notes (Signed)
?[facility] ? ? ?MRN: [id number] DOB: April 05, 1991 ? ?Subjective:  ? ?Charles [name] is a 31 y.o. male presenting for 1 day history of acute onset persistent coughing, chest congestion, chest tightness, throat pain, drainage, runny and stuffy nose.  Patient would like to be checked for strep.  He is not opposed to COVID testing.  Has a history of asthma but is not using his inhaler.  Would like a refill.  He is a smoker, has cut back significantly and is currently down to less than 1/4 pack/day.  He has a history of diabetes and is on metformin only.  Reports that the dose is low and his blood sugar checks have been fine. ? ?No current facility-administered medications for this encounter. ? ?Current Outpatient Medications:  ?  acetaminophen (TYLENOL) 500 MG tablet, Take 500 mg by mouth every 6 (six) hours as needed for mild pain or fever., Disp: , Rfl:  ?  albuterol (VENTOLIN HFA) 108 (90 Base) MCG/ACT inhaler, Inhale 1-2 puffs into the lungs every 6 (six) hours as needed for wheezing or shortness of breath., Disp: 18 g, Rfl: 0 ?  benzonatate (TESSALON) 100 MG capsule, Take 1-2 capsules (100-200 mg total) by mouth 3 (three) times daily as needed for cough., Disp: 60 capsule, Rfl: 0 ?  dicyclomine (BENTYL) 20 MG tablet, Take 1 tablet (20 mg total) by mouth 4 (four) times daily -  before meals and at bedtime., Disp: 40 tablet, Rfl: 0 ?  fluticasone (FLONASE) 50 MCG/ACT nasal spray, Place 2 sprays into both nostrils daily., Disp: 16 g, Rfl: 0 ?  ibuprofen (ADVIL) 600 MG tablet, Take 1 tablet (600 mg total) by mouth every 6 (six) hours as needed. Take with food, Disp: 21 tablet, Rfl: 0 ?  metFORMIN (GLUCOPHAGE) 500 MG tablet, Take by mouth 2 (two) times daily with a meal., Disp: , Rfl:  ?  promethazine-dextromethorphan (PROMETHAZINE-DM) 6.25-15 MG/5ML syrup, Take 5 mLs by mouth at bedtime as needed for cough., Disp: 100 mL, Rfl: 0  ? ?Allergies  ?Allergen Reactions  ? Adhesive [Tape] Rash  ? Amoxicillin  Anaphylaxis and Other (See Comments)  ?  Has patient had a PCN reaction causing immediate rash, facial/tongue/throat swelling, SOB or lightheadedness with hypotension: Yes ?Has patient had a PCN reaction causing severe rash involving mucus membranes or skin necrosis: No ?Has patient had a PCN reaction that required hospitalization: No ?Has patient had a PCN reaction occurring within the last 10 years: Yes ?If all of the above answers are "NO", then may proceed with Cephalosporin use.  ? Codeine Shortness Of Breath  ? Penicillins Anaphylaxis and Other (See Comments)  ?  Has patient had a PCN reaction causing immediate rash, facial/tongue/throat swelling, SOB or lightheadedness with hypotension: Yes ?Has patient had a PCN reaction causing severe rash involving mucus membranes or skin necrosis: No ?Has patient had a PCN reaction that required hospitalization: No ?Has patient had a PCN reaction occurring within the last 10 years: Yes ?If all of the above answers are "NO", then may proceed with Cephalosporin use.  ? Latex Rash  ? ? ?Past Medical History:  ?Diagnosis Date  ? Asthma   ? Back pain   ? Diabetes mellitus without complication (HCC)   ? Rib pain   ?  ? ?Past Surgical History:  ?Procedure Laterality Date  ? ADENOIDECTOMY    ? TONSILLECTOMY    ? ? ?Family History  ?Problem Relation Age of Onset  ? Healthy Mother   ?  Alcohol abuse Father   ? Drug abuse Father   ? ? ?Social History  ? ?Tobacco Use  ? Smoking status: Every Day  ?  Packs/day: 1.00  ?  Years: 10.00  ?  Pack years: 10.00  ?  Types: Cigarettes  ? Smokeless tobacco: Former  ?  Types: Snuff  ?Vaping Use  ? Vaping Use: Never used  ?Substance Use Topics  ? Alcohol use: Yes  ?  Comment: occassionally  ? Drug use: No  ?  Comment: former quit in 2012  ? ? ?ROS ? ? ?Objective:  ? ?Vitals: ?BP 127/83   Pulse (!) 113   Temp 98.5 ?F (36.9 ?C)   Resp 20   SpO2 95%  ? ?Physical Exam ?Constitutional:   ?   General: He is not in acute distress. ?   Appearance:  Normal appearance. He is well-developed. He is obese. He is not ill-appearing, toxic-appearing or diaphoretic.  ?HENT:  ?   Head: Normocephalic and atraumatic.  ?   Right Ear: Tympanic membrane, ear canal and external ear normal. No drainage, swelling or tenderness. No middle ear effusion. There is no impacted cerumen. Tympanic membrane is not erythematous.  ?   Left Ear: Tympanic membrane, ear canal and external ear normal. No drainage, swelling or tenderness.  No middle ear effusion. There is no impacted cerumen. Tympanic membrane is not erythematous.  ?   Nose: Nose normal. No congestion or rhinorrhea.  ?   Mouth/Throat:  ?   Mouth: Mucous membranes are moist.  ?   Pharynx: No pharyngeal swelling, oropharyngeal exudate, posterior oropharyngeal erythema or uvula swelling.  ?   Tonsils: No tonsillar exudate or tonsillar abscesses.  ?   Comments: Post-nasal drainage overlying pharynx.  ?Eyes:  ?   General: No scleral icterus.    ?   Right eye: No discharge.     ?   Left eye: No discharge.  ?   Extraocular Movements: Extraocular movements intact.  ?   Conjunctiva/sclera: Conjunctivae normal.  ?Cardiovascular:  ?   Rate and Rhythm: Regular rhythm. Tachycardia present.  ?   Heart sounds: Normal heart sounds. No murmur heard. ?  No friction rub. No gallop.  ?Pulmonary:  ?   Effort: Pulmonary effort is normal. No respiratory distress.  ?   Breath sounds: Normal breath sounds. No stridor. No wheezing, rhonchi or rales.  ?Musculoskeletal:  ?   Cervical back: Normal range of motion and neck supple. No rigidity. No muscular tenderness.  ?Neurological:  ?   General: No focal deficit present.  ?   Mental Status: He is alert and oriented to person, place, and time.  ?Psychiatric:     ?   Mood and Affect: Mood normal.     ?   Behavior: Behavior normal.     ?   Thought Content: Thought content normal.     ?   Judgment: Judgment normal.  ? ?Results for orders placed or performed during the hospital encounter of 07/09/21 (from the  past 24 hour(s))  ?POCT rapid strep A     Status: None  ? Collection Time: 07/09/21 11:57 AM  ?Result Value Ref Range  ? Rapid Strep A Screen Negative Negative  ? ? ?Assessment and Plan :  ? ?PDMP not reviewed this encounter. ? ?1. Acute viral syndrome   ?2. Chest congestion   ?3. Mild persistent asthma without complication   ?4. Subacute cough   ?5. Throat pain   ? ?Patient has borderline  tachycardia which I suspect is related to his acute viral syndrome as opposed to an acute cardiopulmonary event.  Deferred imaging given clear cardiopulmonary exam. I do not suspect pericarditis either and therefore will defer EKG.  Suspect that his symptoms are worsened by his smoking, history of asthma and lack of use of albuterol.  Respiratory testing and strep culture pending. Counseled patient on potential for adverse effects with medications prescribed/recommended today, ER and return-to-clinic precautions discussed, patient verbalized understanding. ? ?  ?Wallis Bamberg, PA-C ?07/09/21 1219 ? ?

## 2021-07-09 NOTE — ED Triage Notes (Signed)
Pt presents with c/o cough and chest congestion with sore throat that began yesterday  ?

## 2021-07-10 LAB — COVID-19, FLU A+B NAA
Influenza A, NAA: NOT DETECTED
Influenza B, NAA: NOT DETECTED
SARS-CoV-2, NAA: NOT DETECTED

## 2021-07-12 LAB — CULTURE, GROUP A STREP (THRC)

## 2021-09-01 ENCOUNTER — Other Ambulatory Visit: Payer: Self-pay

## 2021-09-01 ENCOUNTER — Emergency Department (HOSPITAL_COMMUNITY)
Admission: EM | Admit: 2021-09-01 | Discharge: 2021-09-02 | Disposition: A | Discharge status: Home / Self Care | Payer: Medicaid Other | Attending: Emergency Medicine | Admitting: Emergency Medicine

## 2021-09-01 ENCOUNTER — Encounter (HOSPITAL_COMMUNITY): Payer: Self-pay

## 2021-09-01 DIAGNOSIS — J45909 Unspecified asthma, uncomplicated: Secondary | ICD-10-CM | POA: Insufficient documentation

## 2021-09-01 DIAGNOSIS — E119 Type 2 diabetes mellitus without complications: Secondary | ICD-10-CM | POA: Diagnosis not present

## 2021-09-01 DIAGNOSIS — R21 Rash and other nonspecific skin eruption: Secondary | ICD-10-CM | POA: Diagnosis not present

## 2021-09-01 DIAGNOSIS — Z7984 Long term (current) use of oral hypoglycemic drugs: Secondary | ICD-10-CM | POA: Insufficient documentation

## 2021-09-01 DIAGNOSIS — Z91040 Latex allergy status: Secondary | ICD-10-CM | POA: Insufficient documentation

## 2021-09-01 DIAGNOSIS — Z7951 Long term (current) use of inhaled steroids: Secondary | ICD-10-CM | POA: Diagnosis not present

## 2021-09-01 LAB — URINALYSIS, ROUTINE W REFLEX MICROSCOPIC
Bilirubin Urine: NEGATIVE
Glucose, UA: NEGATIVE mg/dL
Hgb urine dipstick: NEGATIVE
Ketones, ur: NEGATIVE mg/dL
Leukocytes,Ua: NEGATIVE
Nitrite: NEGATIVE
Protein, ur: NEGATIVE mg/dL
Specific Gravity, Urine: 1.025 (ref 1.005–1.030)
pH: 6 (ref 5.0–8.0)

## 2021-09-01 NOTE — ED Triage Notes (Signed)
Pt presents with red rash all over body. Wife states she noticed it appearing in february. Pt states over the last few weeks its progressed and nothing helps. States it is itchy and endorses some SOB. Denies any new foods, allergies, detergents. Pt denies swelling of trouble or trouble with airway. ?

## 2021-09-02 MED ORDER — DOXEPIN HCL 10 MG PO CAPS: 10.0000 mg | ORAL_CAPSULE | Freq: Three times a day (TID) | ORAL | 0 refills | Status: DC

## 2021-09-02 NOTE — Discharge Instructions (Signed)
The cause for your rash is not clear.  Please follow-up with a dermatologist for further evaluation and treatment. ?

## 2021-09-02 NOTE — ED Provider Notes (Signed)
?[facility] ?Provider Note ? ? ?CSN: [id number] ?Arrival date & time: 09/01/21  2056 ? ?  ? ?History ? ?No chief complaint on file. ? ? ?Charles [name] is a 31 y.o. male. ? ?The history is provided by the patient.  ?He has history of diabetes, asthma and comes in because of ongoing problems with a rash.  Rash is over large parts of his body including his scalp, behind his ears, arms, legs, trunk, gluteal fold.  It is pruritic and burning.  Initially, it was felt to be possibly an allergy to Depakote which was discontinued and he was placed on gabapentin, but there was no improvement.  He also had been given triamcinolone cream which has not helped.  It continues to spread.  He has been taking antihistamine such as diphenhydramine with only slight, temporary relief.  He is also taking praises and and metformin. ?  ?Home Medications ?Prior to Admission medications   ?Medication Sig Start Date End Date Taking? Authorizing Provider  ?acetaminophen (TYLENOL) 500 MG tablet Take 500 mg by mouth every 6 (six) hours as needed for mild pain or fever.    [provider]  ?albuterol (VENTOLIN HFA) 108 (90 Base) MCG/ACT inhaler Inhale 1-2 puffs into the lungs every 6 (six) hours as needed for wheezing or shortness of breath. 07/09/21   Jaynee Eagles, PA-C  ?dicyclomine (BENTYL) 20 MG tablet Take 1 tablet (20 mg total) by mouth 4 (four) times daily -  before meals and at bedtime. 11/22/19   Rolland Porter, MD  ?fluticasone (FLONASE) 50 MCG/ACT nasal spray Place 2 sprays into both nostrils daily. 04/09/21   Melynda Ripple, MD  ?ibuprofen (ADVIL) 600 MG tablet Take 1 tablet (600 mg total) by mouth every 6 (six) hours as needed. Take with food 01/27/20   Triplett, Tammy, PA-C  ?levocetirizine (XYZAL) 5 MG tablet Take 1 tablet (5 mg total) by mouth every evening. 07/09/21   Jaynee Eagles, PA-C  ?metFORMIN (GLUCOPHAGE) 500 MG tablet Take by mouth 2 (two) times daily with a meal.    [provider]  ?    ? ?Allergies    ?Adhesive [tape], Amoxicillin, Codeine, Penicillins, and Latex   ? ?Review of Systems   ?Review of Systems  ?All other systems reviewed and are negative. ? ?Physical Exam ?Updated Vital Signs ?BP 130/82 (BP Location: Right Arm)   Pulse 83   Temp 98.1 ?F (36.7 ?C) (Oral)   Resp 20   Ht 5\' 5"  (1.651 m)   Wt 125.2 kg   SpO2 97%   BMI 45.93 kg/m?  ?Physical Exam ?Vitals and nursing note reviewed.  ?31 year old male, resting comfortably and in no acute distress. Vital signs are normal. Oxygen saturation is 97%, which is normal. ?Head is normocephalic and atraumatic. PERRLA, EOMI. Oropharynx is clear. ?Neck is nontender and supple without adenopathy or JVD. ?Back is nontender and there is no CVA tenderness. ?Lungs are clear without rales, wheezes, or rhonchi. ?Chest is nontender. ?Heart has regular rate and rhythm without murmur. ?Abdomen is soft, flat, nontender. ?Extremities have no cyanosis or edema, full range of motion is present. ?Skin: Rashes present over large portions of the skin consisting of erythematous papules with occasional pustules, confluent in some areas. ?Neurologic: Mental status is normal, cranial nerves are intact, moves all extremities equally. ? ? ? ?ED Results / Procedures / Treatments   ?Labs ?(all labs ordered are listed, but only abnormal results are displayed) ?Labs Reviewed  ?URINALYSIS, ROUTINE  W REFLEX MICROSCOPIC  ? ? ?EKG ?EKG Interpretation ? ?Date/Time:  Tuesday Sep 01 2021 22:12:35 EDT ?Ventricular Rate:  83 ?PR Interval:  152 ?QRS Duration: 78 ?QT Interval:  362 ?QTC Calculation: 425 ?R Axis:   33 ?Text Interpretation: Normal sinus rhythm Normal ECG When compared with ECG of 14-Sep-2019 20:50, Vent. rate has decreased BY  44 BPM Confirmed by Delora Fuel ([id number]) on 09/02/2021 1:17:12 AM ? ?Procedures ?Procedures  ? ? ?Medications Ordered in ED ?Medications - No data to display ? ?ED Course/ Medical Decision Making/ A&P ?  ?                        ?Medical  Decision Making ?Amount and/or Complexity of Data Reviewed ?Labs: ordered. ? ?Risk ?Prescription drug management. ? ? ?Generalized rash of uncertain cause.  In the ED, urinalysis is normal.  ECG is normal.  Old records are reviewed, and he has no relevant past visits.  I feel that he will need to see a dermatologist and patient has been advised of this.  We will try to obtain symptomatic relief with prescription for doxepin. ? ?Final Clinical Impression(s) / ED Diagnoses ?Final diagnoses:  ?Rash  ? ? ?Rx / DC Orders ?ED Discharge Orders   ? ?      Ordered  ?  doxepin (SINEQUAN) 10 MG capsule  3 times daily       ? 09/02/21 0124  ? ?  ?  ? ?  ? ? ?  ?Delora Fuel, MD ?[id number] ? ?

## 2021-11-21 DIAGNOSIS — Z7951 Long term (current) use of inhaled steroids: Secondary | ICD-10-CM | POA: Diagnosis not present

## 2021-11-21 DIAGNOSIS — Z7984 Long term (current) use of oral hypoglycemic drugs: Secondary | ICD-10-CM | POA: Insufficient documentation

## 2021-11-21 DIAGNOSIS — S9031XA Contusion of right foot, initial encounter: Secondary | ICD-10-CM | POA: Diagnosis not present

## 2021-11-21 DIAGNOSIS — S99921A Unspecified injury of right foot, initial encounter: Secondary | ICD-10-CM | POA: Diagnosis present

## 2021-11-21 DIAGNOSIS — W2100XA Struck by hit or thrown ball, unspecified type, initial encounter: Secondary | ICD-10-CM | POA: Diagnosis not present

## 2021-11-21 DIAGNOSIS — E119 Type 2 diabetes mellitus without complications: Secondary | ICD-10-CM | POA: Insufficient documentation

## 2021-11-21 DIAGNOSIS — J45909 Unspecified asthma, uncomplicated: Secondary | ICD-10-CM | POA: Insufficient documentation

## 2021-11-21 DIAGNOSIS — Y92009 Unspecified place in unspecified non-institutional (private) residence as the place of occurrence of the external cause: Secondary | ICD-10-CM | POA: Diagnosis not present

## 2021-11-22 ENCOUNTER — Emergency Department (HOSPITAL_COMMUNITY)
Admission: EM | Admit: 2021-11-22 | Discharge: 2021-11-22 | Disposition: A | Discharge status: Home / Self Care | Payer: Medicaid Other | Attending: Emergency Medicine | Admitting: Emergency Medicine

## 2021-11-22 ENCOUNTER — Other Ambulatory Visit: Payer: Self-pay

## 2021-11-22 ENCOUNTER — Encounter (HOSPITAL_COMMUNITY): Payer: Self-pay | Admitting: Emergency Medicine

## 2021-11-22 ENCOUNTER — Emergency Department (HOSPITAL_COMMUNITY): Payer: Medicaid Other

## 2021-11-22 DIAGNOSIS — S9031XA Contusion of right foot, initial encounter: Secondary | ICD-10-CM

## 2021-11-22 HISTORY — DX: Post-traumatic stress disorder, unspecified: F43.10

## 2021-11-22 HISTORY — DX: Anxiety disorder, unspecified: F41.9

## 2021-11-22 NOTE — ED Triage Notes (Signed)
Pt states he thinks he broke his R foot while playing outside with his kids. States he cannot stand on his foot and that his toes are numb.

## 2021-11-22 NOTE — Discharge Instructions (Addendum)
Apply ice for 30 minutes at a time, 4 times a day.  Wear the postop shoe as needed.  Use crutches as needed.  Take ibuprofen or naproxen as needed for pain.  For additional pain relief, add acetaminophen.  Please be aware that when you combine acetaminophen with ibuprofen or naproxen, you get better pain relief and you get from taking either medication by itself.  If symptoms or not improving over the next 3-5 days, then follow-up with the orthopedic physician for further evaluation.

## 2021-11-22 NOTE — ED Provider Notes (Signed)
Charles [name]   CSN: [id number] Arrival date & time: 11/21/21  2346     History  Chief Complaint  Patient presents with   Foot Pain    Charles [name] is a 31 y.o. male.  The history is provided by the patient.  Foot Pain He has history of diabetes, posttraumatic stress disorder, asthma and comes in after injuring his foot at home.  He states that he was trying to kick a ball and slipped, accidentally kicking a wooden post.  He is complaining of pain in his right foot.  He is unable to bear weight.  He denies other injury.   Home Medications Prior to Admission medications   Medication Sig Start Date End Date Taking? Authorizing Provider  acetaminophen (TYLENOL) 500 MG tablet Take 500 mg by mouth every 6 (six) hours as needed for mild pain or fever.    [provider]  albuterol (VENTOLIN HFA) 108 (90 Base) MCG/ACT inhaler Inhale 1-2 puffs into the lungs every 6 (six) hours as needed for wheezing or shortness of breath. 07/09/21   Wallis Bamberg, PA-C  dicyclomine (BENTYL) 20 MG tablet Take 1 tablet (20 mg total) by mouth 4 (four) times daily -  before meals and at bedtime. 11/22/19   Devoria Albe, MD  fluticasone (FLONASE) 50 MCG/ACT nasal spray Place 2 sprays into both nostrils daily. 04/09/21   Domenick Gong, MD  ibuprofen (ADVIL) 600 MG tablet Take 1 tablet (600 mg total) by mouth every 6 (six) hours as needed. Take with food 01/27/20   Triplett, Tammy, PA-C  levocetirizine (XYZAL) 5 MG tablet Take 1 tablet (5 mg total) by mouth every evening. 07/09/21   Wallis Bamberg, PA-C  metFORMIN (GLUCOPHAGE) 500 MG tablet Take by mouth 2 (two) times daily with a meal.    [provider]      Allergies    Adhesive [tape], Amoxicillin, Codeine, Penicillins, and Latex    Review of Systems   Review of Systems  All other systems reviewed and are negative.   Physical Exam Updated Vital Signs BP 133/72 (BP Location: Right Arm)   Pulse 82   Temp  98.2 F (36.8 C) (Oral)   Resp 16   Ht 5\' 5"  (1.651 m)   Wt 125.2 kg   SpO2 98%   BMI 45.93 kg/m  Physical Exam Vitals and nursing [name] reviewed.  31 year old male, resting comfortably and in no acute distress. Vital signs are normal. Oxygen saturation is 98%, which is normal. Head is normocephalic and atraumatic. PERRLA, EOMI. Oropharynx is clear. Neck is nontender and supple without adenopathy or JVD. Back is nontender and there is no CVA tenderness. Lungs are clear without rales, wheezes, or rhonchi. Chest is nontender. Heart has regular rate and rhythm without murmur. Abdomen is soft, flat, nontender. Extremities: There is no swelling or deformity of the right foot.  There is mild tenderness to palpation in the region of the third and fourth MTP joints without point tenderness.  Remainder of extremity exam is normal. Skin is warm and dry without rash. Neurologic: Mental status is normal, cranial nerves are intact, moves all extremities equally.  ED Results / Procedures / Treatments    Radiology DG Foot Complete Right  Result Date: 11/22/2021 CLINICAL DATA:  Injury to the right foot with pain and swelling. Numbness in the toes. EXAM: RIGHT FOOT COMPLETE - 3+ VIEW COMPARISON:  None Available. FINDINGS: There is no evidence of fracture or dislocation. There  is no evidence of arthropathy or other focal bone abnormality. Soft tissues are unremarkable. IMPRESSION: Negative. Electronically Signed   By: Burman Nieves M.D.   On: 11/22/2021 00:28    Procedures Procedures    Medications Ordered in ED Medications - No data to display  ED Course/ Medical Decision Making/ A&P                           Medical Decision Making Amount and/or Complexity of Data Reviewed Radiology: ordered.   Contusion of the right foot, rule out fracture.  X-rays were obtained and show no evidence of fracture.  I have independently viewed the images, and agree with the radiologist's interpretation.   He is given a postop shoe and crutches to use as needed, told to use over-the-counter NSAIDs and acetaminophen as needed for pain.  Follow-up with orthopedics if not improving over the next 3-5 days.  Final Clinical Impression(s) / ED Diagnoses Final diagnoses:  Contusion of right foot, initial encounter    Rx / DC Orders ED Discharge Orders     None         Dione Booze, MD 11/22/21 [id number]

## 2021-11-26 ENCOUNTER — Ambulatory Visit
Admission: RE | Admit: 2021-11-26 | Discharge: 2021-11-26 | Disposition: A | Discharge status: Home / Self Care | Payer: Medicaid Other | Source: Ambulatory Visit

## 2021-11-26 VITALS — BP 139/92 | HR 91 | Temp 98.2°F | Resp 18

## 2021-11-26 DIAGNOSIS — Z20822 Contact with and (suspected) exposure to covid-19: Secondary | ICD-10-CM

## 2021-11-26 DIAGNOSIS — B349 Viral infection, unspecified: Secondary | ICD-10-CM

## 2021-11-26 DIAGNOSIS — Z1152 Encounter for screening for COVID-19: Secondary | ICD-10-CM

## 2021-11-26 MED ORDER — PROMETHAZINE-DM 6.25-15 MG/5ML PO SYRP: 5.0000 mL | ORAL_SOLUTION | Freq: Four times a day (QID) | ORAL | 0 refills | Status: DC | PRN

## 2021-11-26 MED ORDER — MOLNUPIRAVIR EUA 200MG CAPSULE: 4.0000 | ORAL_CAPSULE | Freq: Two times a day (BID) | ORAL | 0 refills | Status: AC

## 2021-11-26 MED ORDER — ALBUTEROL SULFATE HFA 108 (90 BASE) MCG/ACT IN AERS: 2.0000 | INHALATION_SPRAY | Freq: Four times a day (QID) | RESPIRATORY_TRACT | 0 refills | Status: DC | PRN

## 2021-11-26 MED ORDER — FLUTICASONE PROPIONATE 50 MCG/ACT NA SUSP: 2.0000 | Freq: Every day | NASAL | 0 refills | Status: DC

## 2021-11-26 NOTE — ED Provider Notes (Signed)
[facility]    CSN: [id number] Arrival date & time: 11/26/21  1326      History   Chief Complaint Chief Complaint  Patient presents with   Fatigue    I have no taste or smell I'm very weak and coughing and massive head congestion I have been puking and have had a temperature of 101.3 - Entered by patient    HPI Charles [name] is a 31 y.o. male.   The history is provided by the patient.   Patient presents for complaints of fatigue, headache, vomiting, body aches, and loss of taste or smell that started 1 day ago.  Patient also complains of subjective fevers and chills.  He denies wheezing, shortness of breath, new onset abdominal pain, diarrhea, constipation.  Denies any known sick contacts.  Reports that he did have the one-time [employer] vaccine.  He has been taking Tylenol for his symptoms.  Patient reports history of diabetes, states his diabetes is well controlled at this time with an A1c of less than 6.  Past Medical History:  Diagnosis Date   Anxiety    Asthma    Back pain    Diabetes mellitus without complication (HCC)    PTSD (post-traumatic stress disorder)    Rib pain     There are no problems to display for this patient.   Past Surgical History:  Procedure Laterality Date   ADENOIDECTOMY     TONSILLECTOMY         Home Medications    Prior to Admission medications   Medication Sig Start Date End Date Taking? Authorizing Provider  albuterol (VENTOLIN HFA) 108 (90 Base) MCG/ACT inhaler Inhale 2 puffs into the lungs every 6 (six) hours as needed for wheezing or shortness of breath. 11/26/21  Yes Blessyn Sommerville-Warren, Alda Lea, NP  fluticasone (FLONASE) 50 MCG/ACT nasal spray Place 2 sprays into both nostrils daily. 11/26/21  Yes Hiromi Knodel-Warren, Alda Lea, NP  gabapentin (NEURONTIN) 400 MG capsule Take 400 mg by mouth 3 (three) times daily.   Yes [provider]  molnupiravir EUA (LAGEVRIO) 200 mg CAPS capsule Take 4 capsules (800 mg  total) by mouth 2 (two) times daily for 5 days. 11/26/21 12/01/21 Yes Antonin Meininger-Warren, Alda Lea, NP  prazosin (MINIPRESS) 5 MG capsule Take 5 mg by mouth at bedtime.   Yes [provider]  promethazine-dextromethorphan (PROMETHAZINE-DM) 6.25-15 MG/5ML syrup Take 5 mLs by mouth 4 (four) times daily as needed for cough. 11/26/21  Yes Moriyah Byington-Warren, Alda Lea, NP  acetaminophen (TYLENOL) 500 MG tablet Take 500 mg by mouth every 6 (six) hours as needed for mild pain or fever.    [provider]  dicyclomine (BENTYL) 20 MG tablet Take 1 tablet (20 mg total) by mouth 4 (four) times daily -  before meals and at bedtime. 11/22/19   Rolland Porter, MD  ibuprofen (ADVIL) 600 MG tablet Take 1 tablet (600 mg total) by mouth every 6 (six) hours as needed. Take with food 01/27/20   Triplett, Tammy, PA-C  levocetirizine (XYZAL) 5 MG tablet Take 1 tablet (5 mg total) by mouth every evening. 07/09/21   Jaynee Eagles, PA-C  metFORMIN (GLUCOPHAGE) 500 MG tablet Take by mouth 2 (two) times daily with a meal.    [provider]    Family History Family History  Problem Relation Age of Onset   Healthy Mother    Alcohol abuse Father    Drug abuse Father     Social History Social History  Tobacco Use   Smoking status: Every Day    Packs/day: 1.00    Years: 10.00    Total pack years: 10.00    Types: Cigarettes   Smokeless tobacco: Former    Types: Snuff  Vaping Use   Vaping Use: Never used  Substance Use Topics   Alcohol use: Yes    Comment: occassionally   Drug use: No    Comment: former quit in 2012     Allergies   Adhesive [tape], Amoxicillin, Codeine, Penicillins, and Latex   Review of Systems Review of Systems Per HPI  Physical Exam Triage Vital Signs ED Triage Vitals  Enc Vitals Group     BP 11/26/21 1348 (!) 139/92     Pulse Rate 11/26/21 1348 91     Resp 11/26/21 1348 18     Temp 11/26/21 1348 98.2 F (36.8 C)     Temp Source 11/26/21 1348 Oral     SpO2 11/26/21  1348 97 %     Weight --      Height --      Head Circumference --      Peak Flow --      Pain Score 11/26/21 1349 8     Pain Loc --      Pain Edu? --      Excl. in GC? --    No data found.  Updated Vital Signs BP (!) 139/92 (BP Location: Right Arm)   Pulse 91   Temp 98.2 F (36.8 C) (Oral)   Resp 18   SpO2 97%   Visual Acuity Right Eye Distance:   Left Eye Distance:   Bilateral Distance:    Right Eye Near:   Left Eye Near:    Bilateral Near:     Physical Exam Vitals and nursing note reviewed.  Constitutional:      Appearance: Normal appearance. He is well-developed. He is diaphoretic.     Comments: Ill-appearing  HENT:     Head: Normocephalic and atraumatic.     Right Ear: Tympanic membrane, ear canal and external ear normal.     Left Ear: Tympanic membrane, ear canal and external ear normal.     Nose: Congestion and rhinorrhea present.     Mouth/Throat:     Mouth: Mucous membranes are moist.     Pharynx: No posterior oropharyngeal erythema.  Eyes:     Conjunctiva/sclera: Conjunctivae normal.     Pupils: Pupils are equal, round, and reactive to light.  Neck:     Thyroid: No thyromegaly.     Trachea: No tracheal deviation.  Cardiovascular:     Rate and Rhythm: Normal rate and regular rhythm.     Pulses: Normal pulses.     Heart sounds: Normal heart sounds.  Pulmonary:     Effort: Pulmonary effort is normal.     Breath sounds: Normal breath sounds.  Abdominal:     General: Bowel sounds are normal. There is no distension.     Palpations: Abdomen is soft.     Tenderness: There is no abdominal tenderness.  Musculoskeletal:     Cervical back: Normal range of motion and neck supple.  Lymphadenopathy:     Cervical: No cervical adenopathy.  Skin:    General: Skin is warm.  Neurological:     General: No focal deficit present.     Mental Status: He is alert and oriented to person, place, and time.  Psychiatric:        Mood and Affect: Mood  normal.         Behavior: Behavior normal.        Thought Content: Thought content normal.        Judgment: Judgment normal.      UC Treatments / Results  Labs (all labs ordered are listed, but only abnormal results are displayed) Labs Reviewed  COVID-19, FLU A+B NAA    EKG   Radiology No results found.  Procedures Procedures (including critical care time)  Medications Ordered in UC Medications - No data to display  Initial Impression / Assessment and Plan / UC Course  I have reviewed the triage vital signs and the nursing notes.  Pertinent labs & imaging results that were available during my care of the patient were reviewed by me and considered in my medical decision making (see chart for details).  The patient presents with upper respiratory symptoms with nausea and vomiting that been present for the past 24 hours.  Patient also is experiencing loss of taste and smell.  Patient's vital signs are stable, his exam is reassuring, although he is ill-appearing at this time.  COVID/flu swab is pending.  Based on the patient's current symptoms and history of diabetes,will treat patient with molnupiravir for COVID prophylaxis.  Symptomatic treatment was provided for the patient to include Promethazine DM, an albuterol inhaler and fluticasone. Supportive care recommendations were provided to the patient.  Patient advised that he will be contacted if his COVID results are positive.  Patient was also given strict indications of when to go to the emergency department.  Patient advised to follow-up as needed. Final Clinical Impressions(s) / UC Diagnoses   Final diagnoses:  Exposure to COVID-19 virus  Viral illness  Encounter for screening for COVID-19     Discharge Instructions      The COVID/flu test results are pending. Take medication as prescribed. Increase fluids and allow for plenty of rest. Continue scheduled ibuprofen or Tylenol every 8 hours as needed for pain, fever, or general  discomfort. For the cough, recommend using a humidifier in your bedroom during sleep. Sleep elevated on 2 pillows to help with cough and nasal congestion. Go to the emergency department immediately if you develop worsening shortness of breath, difficulty breathing, inability to keep food or fluids down, or other concerns. Follow-up with your PCP if symptoms do not improve.     ED Prescriptions     Medication Sig Dispense Auth. Provider   molnupiravir EUA (LAGEVRIO) 200 mg CAPS capsule Take 4 capsules (800 mg total) by mouth 2 (two) times daily for 5 days. 40 capsule Uzziah Rigg-Warren, Sadie Haber, NP   fluticasone (FLONASE) 50 MCG/ACT nasal spray Place 2 sprays into both nostrils daily. 16 g Adar Rase-Warren, Sadie Haber, NP   promethazine-dextromethorphan (PROMETHAZINE-DM) 6.25-15 MG/5ML syrup Take 5 mLs by mouth 4 (four) times daily as needed for cough. 140 mL Mallika Sanmiguel-Warren, Sadie Haber, NP   albuterol (VENTOLIN HFA) 108 (90 Base) MCG/ACT inhaler Inhale 2 puffs into the lungs every 6 (six) hours as needed for wheezing or shortness of breath. 8 g Brandley Aldrete-Warren, Sadie Haber, NP      PDMP not reviewed this encounter.   Abran Cantor, NP 11/26/21 1432

## 2021-11-26 NOTE — Discharge Instructions (Addendum)
The COVID/flu test results are pending. Take medication as prescribed. Increase fluids and allow for plenty of rest. Continue scheduled ibuprofen or Tylenol every 8 hours as needed for pain, fever, or general discomfort. For the cough, recommend using a humidifier in your bedroom during sleep. Sleep elevated on 2 pillows to help with cough and nasal congestion. Go to the emergency department immediately if you develop worsening shortness of breath, difficulty breathing, inability to keep food or fluids down, or other concerns. Follow-up with your PCP if symptoms do not improve.

## 2021-11-26 NOTE — ED Triage Notes (Signed)
Fatigue, vomiting, can't taste or smell, body aches.  Symptoms started yesterday and became worse today.

## 2021-11-27 LAB — COVID-19, FLU A+B NAA
Influenza A, NAA: NOT DETECTED
Influenza B, NAA: NOT DETECTED
SARS-CoV-2, NAA: NOT DETECTED

## 2022-01-09 ENCOUNTER — Encounter (HOSPITAL_COMMUNITY): Payer: Self-pay

## 2022-01-09 ENCOUNTER — Emergency Department (HOSPITAL_COMMUNITY)
Admission: EM | Admit: 2022-01-09 | Discharge: 2022-01-09 | Disposition: A | Discharge status: Home / Self Care | Payer: Medicaid Other | Attending: Emergency Medicine | Admitting: Emergency Medicine

## 2022-01-09 ENCOUNTER — Other Ambulatory Visit: Payer: Self-pay

## 2022-01-09 DIAGNOSIS — R531 Weakness: Secondary | ICD-10-CM | POA: Diagnosis present

## 2022-01-09 DIAGNOSIS — Z79899 Other long term (current) drug therapy: Secondary | ICD-10-CM | POA: Insufficient documentation

## 2022-01-09 DIAGNOSIS — E860 Dehydration: Secondary | ICD-10-CM | POA: Diagnosis not present

## 2022-01-09 DIAGNOSIS — L089 Local infection of the skin and subcutaneous tissue, unspecified: Secondary | ICD-10-CM | POA: Diagnosis not present

## 2022-01-09 DIAGNOSIS — Z91040 Latex allergy status: Secondary | ICD-10-CM | POA: Diagnosis not present

## 2022-01-09 LAB — CBC WITH DIFFERENTIAL/PLATELET
Abs Immature Granulocytes: 0.02 10*3/uL (ref 0.00–0.07)
Basophils Absolute: 0.1 10*3/uL (ref 0.0–0.1)
Basophils Relative: 1 %
Eosinophils Absolute: 0.4 10*3/uL (ref 0.0–0.5)
Eosinophils Relative: 5 %
HCT: 41 % (ref 39.0–52.0)
Hemoglobin: 13.6 g/dL (ref 13.0–17.0)
Immature Granulocytes: 0 %
Lymphocytes Relative: 20 %
Lymphs Abs: 1.6 10*3/uL (ref 0.7–4.0)
MCH: 30.6 pg (ref 26.0–34.0)
MCHC: 33.2 g/dL (ref 30.0–36.0)
MCV: 92.1 fL (ref 80.0–100.0)
Monocytes Absolute: 0.7 10*3/uL (ref 0.1–1.0)
Monocytes Relative: 8 %
Neutro Abs: 5.2 10*3/uL (ref 1.7–7.7)
Neutrophils Relative %: 66 %
Platelets: 190 10*3/uL (ref 150–400)
RBC: 4.45 MIL/uL (ref 4.22–5.81)
RDW: 13.5 % (ref 11.5–15.5)
WBC: 7.9 10*3/uL (ref 4.0–10.5)
nRBC: 0 % (ref 0.0–0.2)

## 2022-01-09 LAB — COMPREHENSIVE METABOLIC PANEL
ALT: 23 U/L (ref 0–44)
AST: 26 U/L (ref 15–41)
Albumin: 3.7 g/dL (ref 3.5–5.0)
Alkaline Phosphatase: 68 U/L (ref 38–126)
Anion gap: 7 (ref 5–15)
BUN: 13 mg/dL (ref 6–20)
CO2: 26 mmol/L (ref 22–32)
Calcium: 8.6 mg/dL — ABNORMAL LOW (ref 8.9–10.3)
Chloride: 105 mmol/L (ref 98–111)
Creatinine, Ser: 0.97 mg/dL (ref 0.61–1.24)
GFR, Estimated: >60 mL/min (ref >60)
Glucose, Bld: 100 mg/dL — ABNORMAL HIGH (ref 70–99)
Potassium: 3.2 mmol/L — ABNORMAL LOW (ref 3.5–5.1)
Sodium: 138 mmol/L (ref 135–145)
Total Bilirubin: 0.6 mg/dL (ref 0.3–1.2)
Total Protein: 6.3 g/dL — ABNORMAL LOW (ref 6.5–8.1)

## 2022-01-09 MED ORDER — DOXYCYCLINE HYCLATE 100 MG PO CAPS: 100.0000 mg | ORAL_CAPSULE | Freq: Two times a day (BID) | ORAL | 0 refills | Status: DC

## 2022-01-09 MED ORDER — SODIUM CHLORIDE 0.9 % IV BOLUS
1000.0000 mL | Freq: Once | INTRAVENOUS | Status: AC
Start: 2022-01-09 — End: 2022-01-09
  Administered 2022-01-09: 1000 mL via INTRAVENOUS

## 2022-01-09 MED ORDER — DOXYCYCLINE HYCLATE 100 MG PO TABS
100.0000 mg | ORAL_TABLET | Freq: Once | ORAL | Status: AC
  Administered 2022-01-09: 100 mg via ORAL
  Filled 2022-01-09: qty 1

## 2022-01-09 MED ORDER — POTASSIUM CHLORIDE CRYS ER 20 MEQ PO TBCR
40.0000 meq | EXTENDED_RELEASE_TABLET | Freq: Once | ORAL | Status: AC
  Administered 2022-01-09: 40 meq via ORAL
  Filled 2022-01-09: qty 2

## 2022-01-09 NOTE — ED Triage Notes (Signed)
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 Second complaint of right arm pain, says he isn't able to fully close his right hand without pain. Third complaint of possible abscess to lower abdomin, pt states he noticed it two days ago, hot to the touch, denies drainage.

## 2022-01-09 NOTE — ED Provider Notes (Signed)
[facility] EMERGENCY DEPARTMENT Provider Note   CSN: [id number] Arrival date & time: 01/09/22  1935     History {Add pertinent medical, surgical, social history, OB history to HPI:1} Chief Complaint  Patient presents with   Multiple Complaints    Charles [name] is a 31 y.o. male.  Patient has been working outside a lot the last couple days.  And he states he has not been peeing much.  He also complains of swelling tenderness to his abdomen.  Patient has a history of diabetes.   Weakness      Home Medications Prior to Admission medications   Medication Sig Start Date End Date Taking? Authorizing Provider  doxycycline (VIBRAMYCIN) 100 MG capsule Take 1 capsule (100 mg total) by mouth 2 (two) times daily. One po bid x 7 days 01/09/22  Yes Bethann Berkshire, MD  acetaminophen (TYLENOL) 500 MG tablet Take 500 mg by mouth every 6 (six) hours as needed for mild pain or fever.    [provider]  albuterol (VENTOLIN HFA) 108 (90 Base) MCG/ACT inhaler Inhale 2 puffs into the lungs every 6 (six) hours as needed for wheezing or shortness of breath. 11/26/21   Leath-Warren, Sadie Haber, NP  dicyclomine (BENTYL) 20 MG tablet Take 1 tablet (20 mg total) by mouth 4 (four) times daily -  before meals and at bedtime. 11/22/19   Devoria Albe, MD  fluticasone (FLONASE) 50 MCG/ACT nasal spray Place 2 sprays into both nostrils daily. 11/26/21   Leath-Warren, Sadie Haber, NP  gabapentin (NEURONTIN) 400 MG capsule Take 400 mg by mouth 3 (three) times daily.    [provider]  ibuprofen (ADVIL) 600 MG tablet Take 1 tablet (600 mg total) by mouth every 6 (six) hours as needed. Take with food 01/27/20   Triplett, Tammy, PA-C  levocetirizine (XYZAL) 5 MG tablet Take 1 tablet (5 mg total) by mouth every evening. 07/09/21   Wallis Bamberg, PA-C  metFORMIN (GLUCOPHAGE) 500 MG tablet Take by mouth 2 (two) times daily with a meal.    [provider]  prazosin (MINIPRESS) 5 MG capsule Take 5 mg by mouth  at bedtime.    [provider]  promethazine-dextromethorphan (PROMETHAZINE-DM) 6.25-15 MG/5ML syrup Take 5 mLs by mouth 4 (four) times daily as needed for cough. 11/26/21   Leath-Warren, Sadie Haber, NP      Allergies    Adhesive [tape], Amoxicillin, Codeine, Penicillins, and Latex    Review of Systems   Review of Systems  Neurological:  Positive for weakness.    Physical Exam Updated Vital Signs BP 130/77 (BP Location: Right Arm)   Pulse 86   Temp 98 F (36.7 C) (Oral)   Resp 20   Ht 5\' 5"  (1.651 m)   Wt 122.5 kg   SpO2 98%   BMI 44.93 kg/m  Physical Exam  ED Results / Procedures / Treatments   Labs (all labs ordered are listed, but only abnormal results are displayed) Labs Reviewed  COMPREHENSIVE METABOLIC PANEL - Abnormal; Notable for the following components:      Result Value   Potassium 3.2 (*)    Glucose, Bld 100 (*)    Calcium 8.6 (*)    Total Protein 6.3 (*)    All other components within normal limits  CBC WITH DIFFERENTIAL/PLATELET    EKG None  Radiology No results found.  Procedures Procedures  {Document cardiac monitor, telemetry assessment procedure when appropriate:1}  Medications Ordered in ED Medications  doxycycline (VIBRA-TABS) tablet 100  mg (has no administration in time range)  sodium chloride 0.9 % bolus 1,000 mL (0 mLs Intravenous Stopped 01/09/22 2132)  sodium chloride 0.9 % bolus 1,000 mL (1,000 mLs Intravenous New Bag/Given 01/09/22 2201)    ED Course/ Medical Decision Making/ A&P                           Medical Decision Making Amount and/or Complexity of Data Reviewed Labs: ordered. ECG/medicine tests: ordered.  Risk Prescription drug management.   Patient with dehydration.  He was given 2 L of saline and seems to be improved.  Patient also has a small skin infection that we will give him some doxycycline for.  Patient is to follow-up with primary care doctor this week  {Document critical care time when  appropriate:1} {Document review of labs and clinical decision tools ie heart score, Chads2Vasc2 etc:1}  {Document your independent review of radiology images, and any outside records:1} {Document your discussion with family members, caretakers, and with consultants:1} {Document social determinants of health affecting pt's care:1} {Document your decision making why or why not admission, treatments were needed:1} Final Clinical Impression(s) / ED Diagnoses Final diagnoses:  Dehydration  Skin infection    Rx / DC Orders ED Discharge Orders          Ordered    doxycycline (VIBRAMYCIN) 100 MG capsule  2 times daily        01/09/22 2249

## 2022-01-09 NOTE — Discharge Instructions (Addendum)
Drink plenty of fluids.  Make sure your urine is light yellow or clear.  Follow-up with your family doctor next week for recheck on your skin infection

## 2022-01-25 ENCOUNTER — Encounter (HOSPITAL_COMMUNITY): Payer: Self-pay | Admitting: Emergency Medicine

## 2022-01-25 ENCOUNTER — Emergency Department (HOSPITAL_COMMUNITY)
Admission: EM | Admit: 2022-01-25 | Discharge: 2022-01-25 | Disposition: A | Discharge status: Home / Self Care | Payer: Medicaid Other | Attending: Emergency Medicine | Admitting: Emergency Medicine

## 2022-01-25 ENCOUNTER — Ambulatory Visit: Admit: 2022-01-25 | Discharge status: Absent without leave | Payer: Medicaid Other

## 2022-01-25 ENCOUNTER — Emergency Department (HOSPITAL_COMMUNITY): Payer: Medicaid Other

## 2022-01-25 ENCOUNTER — Other Ambulatory Visit: Payer: Self-pay

## 2022-01-25 DIAGNOSIS — M5412 Radiculopathy, cervical region: Secondary | ICD-10-CM | POA: Insufficient documentation

## 2022-01-25 DIAGNOSIS — E119 Type 2 diabetes mellitus without complications: Secondary | ICD-10-CM | POA: Insufficient documentation

## 2022-01-25 DIAGNOSIS — Z7951 Long term (current) use of inhaled steroids: Secondary | ICD-10-CM | POA: Diagnosis not present

## 2022-01-25 DIAGNOSIS — Z7984 Long term (current) use of oral hypoglycemic drugs: Secondary | ICD-10-CM | POA: Diagnosis not present

## 2022-01-25 DIAGNOSIS — Z91040 Latex allergy status: Secondary | ICD-10-CM | POA: Insufficient documentation

## 2022-01-25 DIAGNOSIS — R202 Paresthesia of skin: Secondary | ICD-10-CM | POA: Diagnosis present

## 2022-01-25 DIAGNOSIS — J45909 Unspecified asthma, uncomplicated: Secondary | ICD-10-CM | POA: Insufficient documentation

## 2022-01-25 MED ORDER — CYCLOBENZAPRINE HCL 10 MG PO TABS
5.0000 mg | ORAL_TABLET | Freq: Once | ORAL | Status: AC
  Administered 2022-01-25: 5 mg via ORAL
  Filled 2022-01-25: qty 1

## 2022-01-25 MED ORDER — PREDNISONE 10 MG PO TABS: ORAL_TABLET | ORAL | 0 refills | Status: DC

## 2022-01-25 MED ORDER — PREDNISONE 50 MG PO TABS
60.0000 mg | ORAL_TABLET | Freq: Once | ORAL | Status: AC
  Administered 2022-01-25: 60 mg via ORAL
  Filled 2022-01-25: qty 1

## 2022-01-25 MED ORDER — CYCLOBENZAPRINE HCL 5 MG PO TABS: 5.0000 mg | ORAL_TABLET | Freq: Three times a day (TID) | ORAL | 0 refills | Status: DC | PRN

## 2022-01-25 NOTE — Discharge Instructions (Signed)
As discussed I suspect you have a pinched nerve causing the symptoms in your arm, it appears it may be starting in your cervical spine but you may need further test to rule out carpal tunnel as well.  Dr. Merlene Laughter can run further test to help determine the exact source of your symptoms.  In the interim I recommend the medications prescribed to help rule out muscle spasm and deep tissue inflammation as the source of symptoms.  You may use ice and heat as discussed as needed for symptoms.  Also plan to see your doctor for recheck of your symptoms in 1 week.

## 2022-01-25 NOTE — ED Notes (Signed)
ED Provider at bedside. 

## 2022-01-25 NOTE — ED Triage Notes (Signed)
Pt to the ED with complaints of right arm pain from his shoulder to fingers for the last two days.  Pt states it goes numb at times.

## 2022-01-25 NOTE — ED Provider Notes (Signed)
[facility] EMERGENCY DEPARTMENT Provider Note   CSN: [id number] Arrival date & time: 01/25/22  1610     History {Add pertinent medical, surgical, social history, OB history to HPI:1} Chief Complaint  Patient presents with  . Arm Injury    Charles [name] is a 31 y.o. male.  The history is provided by the patient.       Home Medications Prior to Admission medications   Medication Sig Start Date End Date Taking? Authorizing Provider  acetaminophen (TYLENOL) 500 MG tablet Take 500 mg by mouth every 6 (six) hours as needed for mild pain or fever.    [provider]  albuterol (VENTOLIN HFA) 108 (90 Base) MCG/ACT inhaler Inhale 2 puffs into the lungs every 6 (six) hours as needed for wheezing or shortness of breath. 11/26/21   Leath-Warren, Sadie Haber, NP  dicyclomine (BENTYL) 20 MG tablet Take 1 tablet (20 mg total) by mouth 4 (four) times daily -  before meals and at bedtime. 11/22/19   Devoria Albe, MD  doxycycline (VIBRAMYCIN) 100 MG capsule Take 1 capsule (100 mg total) by mouth 2 (two) times daily. One po bid x 7 days 01/09/22   Bethann Berkshire, MD  fluticasone [facility]) 50 MCG/ACT nasal spray Place 2 sprays into both nostrils daily. 11/26/21   Leath-Warren, Sadie Haber, NP  gabapentin (NEURONTIN) 400 MG capsule Take 400 mg by mouth 3 (three) times daily.    [provider]  ibuprofen (ADVIL) 600 MG tablet Take 1 tablet (600 mg total) by mouth every 6 (six) hours as needed. Take with food 01/27/20   Triplett, Tammy, PA-C  levocetirizine (XYZAL) 5 MG tablet Take 1 tablet (5 mg total) by mouth every evening. 07/09/21   Wallis Bamberg, PA-C  metFORMIN (GLUCOPHAGE) 500 MG tablet Take by mouth 2 (two) times daily with a meal.    [provider]  prazosin (MINIPRESS) 5 MG capsule Take 5 mg by mouth at bedtime.    [provider]  promethazine-dextromethorphan (PROMETHAZINE-DM) 6.25-15 MG/5ML syrup Take 5 mLs by mouth 4 (four) times daily as needed for cough. 11/26/21    Leath-Warren, Sadie Haber, NP      Allergies    Adhesive [tape], Amoxicillin, Codeine, Penicillins, and Latex    Review of Systems   Review of Systems  Physical Exam Updated Vital Signs BP (!) 136/90 (BP Location: Left Arm)   Pulse (!) 108   Temp 98.4 F (36.9 C) (Oral)   Resp 20   Ht 5\' 5"  (1.651 m)   Wt 122.5 kg   SpO2 96%   BMI 44.93 kg/m  Physical Exam  ED Results / Procedures / Treatments   Labs (all labs ordered are listed, but only abnormal results are displayed) Labs Reviewed - No data to display  EKG None  Radiology DG Cervical Spine Complete  Result Date: 01/25/2022 CLINICAL DATA:  Right arm pain and occasional numbness EXAM: CERVICAL SPINE - COMPLETE 4+ VIEW COMPARISON:  CT 10/13/2016 FINDINGS: Straightening of normal cervical lordosis. The vertebral body heights are well preserved. There is disc space narrowing and endplate spurring identified at C5-6. No signs of fracture or dislocation. No significant neuroforaminal stenosis identified. IMPRESSION: 1. Mild degenerative disc disease at C5-6. 2. No acute findings. Electronically Signed   By: 10/15/2016 M.D.   On: 01/25/2022 20:28    Procedures Procedures  {Document cardiac monitor, telemetry assessment procedure when appropriate:1}  Medications Ordered in ED Medications  predniSONE (DELTASONE) tablet 60 mg (60 mg Oral  Given 01/25/22 1952)  cyclobenzaprine (FLEXERIL) tablet 5 mg (5 mg Oral Given 01/25/22 1953)    ED Course/ Medical Decision Making/ A&P                           Medical Decision Making Amount and/or Complexity of Data Reviewed Radiology: ordered.  Risk Prescription drug management.   ***  {Document critical care time when appropriate:1} {Document review of labs and clinical decision tools ie heart score, Chads2Vasc2 etc:1}  {Document your independent review of radiology images, and any outside records:1} {Document your discussion with family members, caretakers, and with  consultants:1} {Document social determinants of health affecting pt's care:1} {Document your decision making why or why not admission, treatments were needed:1} Final Clinical Impression(s) / ED Diagnoses Final diagnoses:  None    Rx / DC Orders ED Discharge Orders     None

## 2022-10-06 DIAGNOSIS — B372 Candidiasis of skin and nail: Secondary | ICD-10-CM | POA: Diagnosis not present

## 2022-10-06 DIAGNOSIS — Z6839 Body mass index (BMI) 39.0-39.9, adult: Secondary | ICD-10-CM | POA: Diagnosis not present

## 2022-10-06 DIAGNOSIS — R21 Rash and other nonspecific skin eruption: Secondary | ICD-10-CM | POA: Diagnosis not present

## 2023-01-08 ENCOUNTER — Encounter (HOSPITAL_COMMUNITY): Payer: Self-pay

## 2023-01-08 ENCOUNTER — Emergency Department (HOSPITAL_COMMUNITY)
Admission: EM | Admit: 2023-01-08 | Discharge: 2023-01-08 | Disposition: A | Discharge status: Home / Self Care | Payer: Medicaid Other | Attending: Emergency Medicine | Admitting: Emergency Medicine

## 2023-01-08 ENCOUNTER — Emergency Department (HOSPITAL_COMMUNITY): Payer: Medicaid Other

## 2023-01-08 ENCOUNTER — Other Ambulatory Visit: Payer: Self-pay

## 2023-01-08 DIAGNOSIS — X509XXA Other and unspecified overexertion or strenuous movements or postures, initial encounter: Secondary | ICD-10-CM | POA: Diagnosis not present

## 2023-01-08 DIAGNOSIS — Z91040 Latex allergy status: Secondary | ICD-10-CM | POA: Insufficient documentation

## 2023-01-08 DIAGNOSIS — S86911A Strain of unspecified muscle(s) and tendon(s) at lower leg level, right leg, initial encounter: Secondary | ICD-10-CM

## 2023-01-08 DIAGNOSIS — S8391XA Sprain of unspecified site of right knee, initial encounter: Secondary | ICD-10-CM | POA: Diagnosis not present

## 2023-01-08 DIAGNOSIS — S8991XA Unspecified injury of right lower leg, initial encounter: Secondary | ICD-10-CM | POA: Diagnosis not present

## 2023-01-08 DIAGNOSIS — M25561 Pain in right knee: Secondary | ICD-10-CM | POA: Diagnosis not present

## 2023-01-08 MED ORDER — NAPROXEN 250 MG PO TABS
500.0000 mg | ORAL_TABLET | Freq: Once | ORAL | Status: AC
  Administered 2023-01-08: 500 mg via ORAL
  Filled 2023-01-08: qty 2

## 2023-01-08 MED ORDER — NAPROXEN 500 MG PO TABS: 500.0000 mg | ORAL_TABLET | Freq: Two times a day (BID) | ORAL | 0 refills | Status: DC

## 2023-01-08 NOTE — Discharge Instructions (Signed)
Your x-ray shows no broken bones, you can use a knee immobilizer and crutches as needed, Naprosyn twice a day, I would not recommend going back to work until you have been seen by an orthopedist, please see the work note attached

## 2023-01-08 NOTE — ED Provider Notes (Signed)
[place] [facility] DEPARTMENT AT [facility] Provider Note   CSN: [id number] Arrival date & time: 01/08/23  1508     History  Chief Complaint  Patient presents with   Knee Injury    Charles [name] is a 32 y.o. male.  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  He has tried to walk on it but it does cause pain, no other injuries, no prior injury to the knee  Home Medications Prior to Admission medications   Medication Sig Start Date End Date Taking? Authorizing Provider  naproxen (NAPROSYN) 500 MG tablet Take 1 tablet (500 mg total) by mouth 2 (two) times daily with a meal. 01/08/23  Yes Eber Hong, MD  acetaminophen (TYLENOL) 500 MG tablet Take 500 mg by mouth every 6 (six) hours as needed for mild pain or fever.    [provider]  albuterol (VENTOLIN HFA) 108 (90 Base) MCG/ACT inhaler Inhale 2 puffs into the lungs every 6 (six) hours as needed for wheezing or shortness of breath. 11/26/21   Leath-Warren, Sadie Haber, NP  cyclobenzaprine (FLEXERIL) 5 MG tablet Take 1 tablet (5 mg total) by mouth 3 (three) times daily as needed for muscle spasms. 01/25/22   Burgess Amor, PA-C  dicyclomine (BENTYL) 20 MG tablet Take 1 tablet (20 mg total) by mouth 4 (four) times daily -  before meals and at bedtime. 11/22/19   Devoria Albe, MD  doxycycline (VIBRAMYCIN) 100 MG capsule Take 1 capsule (100 mg total) by mouth 2 (two) times daily. One po bid x 7 days 01/09/22   Bethann Berkshire, MD  fluticasone [facility]) 50 MCG/ACT nasal spray Place 2 sprays into both nostrils daily. 11/26/21   Leath-Warren, Sadie Haber, NP  gabapentin (NEURONTIN) 400 MG capsule Take 400 mg by mouth 3 (three) times daily.     [provider]  ibuprofen (ADVIL) 600 MG tablet Take 1 tablet (600 mg total) by mouth every 6 (six) hours as needed. Take with food 01/27/20   Triplett, Tammy, PA-C  levocetirizine (XYZAL) 5 MG tablet Take 1 tablet (5 mg total) by mouth every evening. 07/09/21   Wallis Bamberg, PA-C  metFORMIN (GLUCOPHAGE) 500 MG tablet Take by mouth 2 (two) times daily with a meal.    [provider]  prazosin (MINIPRESS) 5 MG capsule Take 5 mg by mouth at bedtime.    [provider]  predniSONE (DELTASONE) 10 MG tablet 6, 5, 4, 3, 2 then 1 tablet by mouth daily for 6 days total. 01/25/22   Idol, Raynelle Fanning, PA-C  promethazine-dextromethorphan (PROMETHAZINE-DM) 6.25-15 MG/5ML syrup Take 5 mLs by mouth 4 (four) times daily as needed for cough. 11/26/21   Leath-Warren, Sadie Haber, NP      Allergies    Adhesive [tape], Amoxicillin, Codeine, Penicillins, and Latex    Review of Systems   Review of Systems  Neurological:  Negative for weakness and numbness.    Physical Exam Updated Vital Signs BP (!) 140/97   Pulse (!) 106   Temp 98.6 F (37 C) (Oral)   Resp 18   Ht 1.651 m (5\' 5" )  Wt 117.9 kg   SpO2 98%   BMI 43.27 kg/m  Physical Exam Vitals and nursing note reviewed.  Constitutional:      Appearance: He is well-developed. He is not diaphoretic.  HENT:     Head: Normocephalic and atraumatic.  Eyes:     General:        Right eye: No discharge.        Left eye: No discharge.     Conjunctiva/sclera: Conjunctivae normal.  Pulmonary:     Effort: Pulmonary effort is normal. No respiratory distress.  Musculoskeletal:     Comments: No obvious effusion, no obvious deformity, straight leg raise bilaterally is normal and in strength, the knee extensor mechanism is completely intact, there is no tenderness over the patella.  There is no obvious joint laxity to anterior posterior or medial or lateral stresses.  Pulses are normal distally  Skin:    General: Skin is warm and dry.      Findings: No erythema or rash.  Neurological:     Mental Status: He is alert.     Coordination: Coordination normal.     Comments: Normal strength and sensation of the right lower extremity in its entirety     ED Results / Procedures / Treatments   Labs (all labs ordered are listed, but only abnormal results are displayed) Labs Reviewed - No data to display  EKG None  Radiology DG Knee Complete 4 Views Right  Result Date: 01/08/2023 CLINICAL DATA:  Trauma to the right knee and pain. EXAM: RIGHT KNEE - COMPLETE 4+ VIEW COMPARISON:  None Available. FINDINGS: No evidence of fracture, dislocation, or joint effusion. No evidence of arthropathy or other focal bone abnormality. Soft tissues are unremarkable. IMPRESSION: Negative. Electronically Signed   By: Elgie Collard M.D.   On: 01/08/2023 16:41    Procedures Procedures    Medications Ordered in ED Medications  naproxen (NAPROSYN) tablet 500 mg (has no administration in time range)    ED Course/ Medical Decision Making/ A&P                                 Medical Decision Making Amount and/or Complexity of Data Reviewed Radiology: ordered.  Risk Prescription drug management.   At this time it is not clear exactly where the injury is however I would suspect that is ligamentous, I have looked at the x-rays and there does not appear to be any signs of numbness or weakness, there is no obvious effusions, there is normal vascular flow, no signs of bony abnormalities on x-ray according to my interpretation.  Imaging: I personally viewed and interpret the x-rays which show no signs of broken bones or dislocations, agree with radiologist interpretation.  Medication management: Naprosyn, home with an anti-inflammatory  ED course: Patient outfitted with knee immobilizer as best we could and crutches, he is agreeable to follow-up with Dr. Romeo Apple.        Final Clinical Impression(s) / ED Diagnoses Final diagnoses:  Knee  strain, right, initial encounter    Rx / DC Orders ED Discharge Orders          Ordered    naproxen (NAPROSYN) 500 MG tablet  2 times daily with meals        01/08/23 1653              Eber Hong, MD 01/08/23 1654

## 2023-01-08 NOTE — ED Triage Notes (Signed)
"  Fell down steps and hurt my right knee" per pt

## 2023-01-10 ENCOUNTER — Telehealth: Payer: Self-pay | Admitting: Orthopedic Surgery

## 2023-01-10 NOTE — Telephone Encounter (Signed)
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   Advised patient we will need the workers comp claim number authorizing him to come to our office.    I gave him [name] phone number and she can advise him what all information he needs.

## 2023-01-26 ENCOUNTER — Ambulatory Visit: Payer: Medicaid Other | Admitting: Orthopedic Surgery

## 2023-01-27 ENCOUNTER — Encounter: Payer: Self-pay | Admitting: Orthopedic Surgery

## 2023-01-27 ENCOUNTER — Ambulatory Visit: Payer: No Typology Code available for payment source | Admitting: Orthopedic Surgery

## 2023-01-27 VITALS — BP 119/81 | HR 86 | Ht 65.0 in | Wt 265.4 lb

## 2023-01-27 DIAGNOSIS — S8991XA Unspecified injury of right lower leg, initial encounter: Secondary | ICD-10-CM | POA: Diagnosis not present

## 2023-01-27 DIAGNOSIS — S8001XA Contusion of right knee, initial encounter: Secondary | ICD-10-CM

## 2023-01-27 MED ORDER — NAPROXEN 500 MG PO TABS
500.0000 mg | ORAL_TABLET | Freq: Two times a day (BID) | ORAL | 0 refills | Status: DC
Start: 2023-01-27 — End: 2023-02-21

## 2023-01-27 NOTE — Progress Notes (Signed)
Office Visit Note   Patient: Charles [name]           Date of Birth: 1990-06-11           MRN: [id number] Visit Date: 01/27/2023 Requested by: [facility] [address] [place],  [place] [address] PCP: [facility]   Assessment & Plan:   Encounter Diagnoses  Name Primary?   Knee injury, right, initial encounter    Contusion of right knee, initial encounter Yes    Meds ordered this encounter  Medications   naproxen (NAPROSYN) 500 MG tablet    Sig: Take 1 tablet (500 mg total) by mouth 2 (two) times daily with a meal.    Dispense:  60 tablet    Refill:  59    32 year old male acute knee injury rule out meniscus tear versus contusion  Home exercise program, economy hinged brace, physical therapy, naproxen, out of work note for 3 weeks return in 3 weeks, difficulty rule out meniscus tear at this time   Subjective: Chief Complaint  Patient presents with   Knee Injury    While coming down stairs pt missed steps and R knee was caught in metal stairs while wearing approx 250 lbs of gear. Pt is a Theatre manager. 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 [employer] he was in a high-rise building situation became disoriented fell as he was going down the stairs his knee was caught between 2 metal steps  His date of injury is 01/08/2023 he went to the ER on the same day x-rays were negative he was put on naproxen placed in a knee immobilizer and started on crutches  Today he complains of pain but he did stop using the crutches most of his pain is on the medial side of the joint                ROS: No chest pain or shortness of breath no numbness or tingling he did have some back and hip discomfort after his fall but not bothering him much today   Images personally read and my interpretation : Plain imaging was done at the hospital no acute fracture was noted  Visit  Diagnoses:  1. Contusion of right knee, initial encounter   2. Knee injury, right, initial encounter      Follow-Up Instructions: Return in about 3 weeks (around 02/17/2023) for right knee pain .    Objective: Vital Signs: BP 119/81   Pulse 86   Ht 5\' 5"  (1.651 m)   Wt 265 lb 6.4 oz (120.4 kg)   BMI 44.16 kg/m   Physical Exam Vitals and nursing note reviewed.  Constitutional:      Appearance: Normal appearance.  HENT:     Head: Normocephalic and atraumatic.  Eyes:     General: No scleral icterus.       Right eye: No discharge.        Left eye: No discharge.     Extraocular Movements: Extraocular movements intact.     Conjunctiva/sclera: Conjunctivae normal.     Pupils: Pupils are equal, round, and reactive to light.  Cardiovascular:     Rate and Rhythm: Normal rate.     Pulses: Normal pulses.  Skin:    General: Skin is warm and dry.     Capillary Refill: Capillary refill takes less than 2 seconds.  Neurological:  General: No focal deficit present.     Mental Status: He is alert and oriented to person, place, and time.     Gait: Gait abnormal.  Psychiatric:        Mood and Affect: Mood normal.        Behavior: Behavior normal.        Thought Content: Thought content normal.        Judgment: Judgment normal.      Right Knee Exam   Muscle Strength  The patient has normal right knee strength.  Tenderness  The patient is experiencing tenderness in the lateral joint line, medial joint line and patella.  Range of Motion  Extension:  abnormal  Flexion:  abnormal   Tests  Lachman:  Anterior - negative    Posterior - negative Drawer:  Anterior - negative    Posterior - negative  Other  Erythema: absent Scars: absent Sensation: normal Pulse: present Swelling: none  Comments:  Difficult to tell if he had an effusion or not I think he may have a small effusion.  His knee was stable his hip was normal   Left Knee Exam  Left knee exam is  normal.       Specialty Comments:  No specialty comments available.  Imaging: No results found.   PMFS History: There are no problems to display for this patient.  Past Medical History:  Diagnosis Date   Anxiety    Asthma    Back pain    Diabetes mellitus without complication (HCC)    PTSD (post-traumatic stress disorder)    Rib pain     Family History  Problem Relation Age of Onset   Healthy Mother    Alcohol abuse Father    Drug abuse Father     Past Surgical History:  Procedure Laterality Date   ADENOIDECTOMY     TONSILLECTOMY     Social History   Occupational History   Not on file  Tobacco Use   Smoking status: Every Day    Current packs/day: 1.00    Average packs/day: 1 pack/day for 10.0 years (10.0 ttl pk-yrs)    Types: Cigarettes   Smokeless tobacco: Former    Types: Snuff  Vaping Use   Vaping status: Never Used  Substance and Sexual Activity   Alcohol use: Yes    Comment: occassionally   Drug use: No    Comment: former quit in 2012   Sexual activity: Never

## 2023-01-27 NOTE — Patient Instructions (Signed)
We will contact workers comp to see if and where you can go for physical therapy This process takes several weeks if you want to speed up process, call the workers comp to let them know  Work note

## 2023-02-20 ENCOUNTER — Other Ambulatory Visit: Payer: Self-pay | Admitting: Orthopedic Surgery

## 2023-02-20 DIAGNOSIS — S8991XA Unspecified injury of right lower leg, initial encounter: Secondary | ICD-10-CM

## 2023-02-20 DIAGNOSIS — S8001XA Contusion of right knee, initial encounter: Secondary | ICD-10-CM

## 2023-02-23 ENCOUNTER — Telehealth: Payer: Self-pay

## 2023-02-23 ENCOUNTER — Ambulatory Visit
Admission: EM | Admit: 2023-02-23 | Discharge: 2023-02-23 | Disposition: A | Discharge status: Home / Self Care | Payer: Medicaid Other | Attending: Family Medicine | Admitting: Family Medicine

## 2023-02-23 DIAGNOSIS — J4521 Mild intermittent asthma with (acute) exacerbation: Secondary | ICD-10-CM | POA: Diagnosis not present

## 2023-02-23 DIAGNOSIS — F17210 Nicotine dependence, cigarettes, uncomplicated: Secondary | ICD-10-CM | POA: Diagnosis not present

## 2023-02-23 DIAGNOSIS — J069 Acute upper respiratory infection, unspecified: Secondary | ICD-10-CM | POA: Insufficient documentation

## 2023-02-23 DIAGNOSIS — Z1152 Encounter for screening for COVID-19: Secondary | ICD-10-CM | POA: Insufficient documentation

## 2023-02-23 DIAGNOSIS — M546 Pain in thoracic spine: Secondary | ICD-10-CM | POA: Diagnosis not present

## 2023-02-23 LAB — POCT INFLUENZA A/B
Influenza A, POC: NEGATIVE
Influenza B, POC: NEGATIVE

## 2023-02-23 MED ORDER — PROMETHAZINE-DM 6.25-15 MG/5ML PO SYRP: 5.0000 mL | ORAL_SOLUTION | Freq: Four times a day (QID) | ORAL | 0 refills | Status: DC | PRN

## 2023-02-23 MED ORDER — FLUTICASONE PROPIONATE 50 MCG/ACT NA SUSP: 1.0000 | Freq: Two times a day (BID) | NASAL | 2 refills | Status: DC

## 2023-02-23 MED ORDER — ALBUTEROL SULFATE HFA 108 (90 BASE) MCG/ACT IN AERS: 2.0000 | INHALATION_SPRAY | RESPIRATORY_TRACT | 0 refills | Status: AC | PRN

## 2023-02-23 MED ORDER — TIZANIDINE HCL 4 MG PO CAPS: 4.0000 mg | ORAL_CAPSULE | Freq: Three times a day (TID) | ORAL | 0 refills | Status: DC | PRN

## 2023-02-23 NOTE — ED Triage Notes (Signed)
Pt c/o fever body aches sore throat, lost of taste and smell. Nausea vomiting diarrhea x 1 day

## 2023-02-23 NOTE — ED Provider Notes (Signed)
[facility]    CSN: [id number] Arrival date & time: 02/23/23  1625      History   Chief Complaint No chief complaint on file.   HPI Charles [name] is a 32 y.o. male.   Patient presenting today with 1 day history of fever, body aches, sore throat, cough, congestion, loss of taste and smell, nausea vomiting and diarrhea.  Denies chest pain, shortness of breath, abdominal pain, rashes.  So far not trying anything over-the-counter for symptoms.  History of asthma on albuterol as needed.  Family members sick with similar symptoms.    Past Medical History:  Diagnosis Date   Anxiety    Asthma    Back pain    Diabetes mellitus without complication (HCC)    PTSD (post-traumatic stress disorder)    Rib pain     There are no problems to display for this patient.   Past Surgical History:  Procedure Laterality Date   ADENOIDECTOMY     TONSILLECTOMY         Home Medications    Prior to Admission medications   Medication Sig Start Date End Date Taking? Authorizing Provider  fluticasone (FLONASE) 50 MCG/ACT nasal spray Place 1 spray into both nostrils 2 (two) times daily. 02/23/23  Yes Particia Nearing, PA-C  promethazine-dextromethorphan (PROMETHAZINE-DM) 6.25-15 MG/5ML syrup Take 5 mLs by mouth 4 (four) times daily as needed. 02/23/23  Yes Particia Nearing, PA-C  tiZANidine (ZANAFLEX) 4 MG capsule Take 1 capsule (4 mg total) by mouth 3 (three) times daily as needed for muscle spasms. Do not drink alcohol or drive while taking this medication.  May cause drowsiness. 02/23/23  Yes Particia Nearing, PA-C  acetaminophen (TYLENOL) 500 MG tablet Take 500 mg by mouth every 6 (six) hours as needed for mild pain or fever. Patient not taking: Reported on 01/27/2023    [provider]  albuterol (VENTOLIN HFA) 108 (90 Base) MCG/ACT inhaler Inhale 2 puffs into the lungs every 4 (four) hours as needed for wheezing or shortness of breath. 02/23/23   Particia Nearing, PA-C  cyclobenzaprine (FLEXERIL) 5 MG tablet Take 1 tablet (5 mg total) by mouth 3 (three) times daily as needed for muscle spasms. Patient not taking: Reported on 01/27/2023 01/25/22   Burgess Amor, PA-C  dicyclomine (BENTYL) 20 MG tablet Take 1 tablet (20 mg total) by mouth 4 (four) times daily -  before meals and at bedtime. Patient not taking: Reported on 01/27/2023 11/22/19   Devoria Albe, MD  doxycycline (VIBRAMYCIN) 100 MG capsule Take 1 capsule (100 mg total) by mouth 2 (two) times daily. One po bid x 7 days Patient not taking: Reported on 01/27/2023 01/09/22   Bethann Berkshire, MD  fluticasone [facility]) 50 MCG/ACT nasal spray Place 2 sprays into both nostrils daily. Patient not taking: Reported on 01/27/2023 11/26/21   Leath-Warren, Sadie Haber, NP  gabapentin (NEURONTIN) 400 MG capsule Take 400 mg by mouth 3 (three) times daily. Patient not taking: Reported on 01/27/2023    [provider]  ibuprofen (ADVIL) 600 MG tablet Take 1 tablet (600 mg total) by mouth every 6 (six) hours as needed. Take with food Patient not taking: Reported on 01/27/2023 01/27/20   Triplett, Tammy, PA-C  levocetirizine (XYZAL) 5 MG tablet Take 1 tablet (5 mg total) by mouth every evening. Patient not taking: Reported on 01/27/2023 07/09/21   Wallis Bamberg, PA-C  metFORMIN (GLUCOPHAGE) 500 MG tablet Take by mouth 2 (two) times daily with a  meal. Patient not taking: Reported on 01/27/2023    [provider]  naproxen (NAPROSYN) 500 MG tablet TAKE 1 TABLET BY MOUTH TWICE DAILY WITH A MEAL 02/21/23   Vickki Hearing, MD  prazosin (MINIPRESS) 5 MG capsule Take 5 mg by mouth at bedtime. Patient not taking: Reported on 01/27/2023    [provider]  predniSONE (DELTASONE) 10 MG tablet 6, 5, 4, 3, 2 then 1 tablet by mouth daily for 6 days total. Patient not taking: Reported on 01/27/2023 01/25/22   Burgess Amor, PA-C  promethazine-dextromethorphan (PROMETHAZINE-DM) 6.25-15 MG/5ML syrup Take 5 mLs  by mouth 4 (four) times daily as needed for cough. Patient not taking: Reported on 01/27/2023 11/26/21   Leath-Warren, Sadie Haber, NP  sildenafil (VIAGRA) 25 MG tablet SMARTSIG:1-4 Tablet(s) By Mouth Patient not taking: Reported on 01/27/2023 12/02/22   [provider]    Family History Family History  Problem Relation Age of Onset   Healthy Mother    Alcohol abuse Father    Drug abuse Father     Social History Social History   Tobacco Use   Smoking status: Every Day    Current packs/day: 1.00    Average packs/day: 1 pack/day for 10.0 years (10.0 ttl pk-yrs)    Types: Cigarettes   Smokeless tobacco: Former    Types: Snuff  Vaping Use   Vaping status: Never Used  Substance Use Topics   Alcohol use: Yes    Comment: occassionally   Drug use: No    Comment: former quit in 2012     Allergies   Adhesive [tape], Amoxicillin, Codeine, Penicillins, and Latex   Review of Systems Review of Systems Per HPI  Physical Exam Triage Vital Signs ED Triage Vitals  Encounter Vitals Group     BP 02/23/23 1643 122/77     Systolic BP Percentile --      Diastolic BP Percentile --      Pulse Rate 02/23/23 1643 82     Resp 02/23/23 1643 17     Temp 02/23/23 1643 98.3 F (36.8 C)     Temp Source 02/23/23 1643 Oral     SpO2 02/23/23 1643 93 %     Weight --      Height --      Head Circumference --      Peak Flow --      Pain Score 02/23/23 1645 8     Pain Loc --      Pain Education --      Exclude from Growth Chart --    No data found.  Updated Vital Signs BP 122/77 (BP Location: Right Arm)   Pulse 82   Temp 98.3 F (36.8 C) (Oral)   Resp 17   SpO2 93%   Visual Acuity Right Eye Distance:   Left Eye Distance:   Bilateral Distance:    Right Eye Near:   Left Eye Near:    Bilateral Near:     Physical Exam Vitals and nursing note reviewed.  Constitutional:      Appearance: He is well-developed.  HENT:     Head: Atraumatic.     Right Ear: External ear  normal.     Left Ear: External ear normal.     Nose: Rhinorrhea present.     Mouth/Throat:     Pharynx: Posterior oropharyngeal erythema present. No oropharyngeal exudate.  Eyes:     Conjunctiva/sclera: Conjunctivae normal.     Pupils: Pupils are equal, round, and reactive  to light.  Cardiovascular:     Rate and Rhythm: Normal rate and regular rhythm.  Pulmonary:     Effort: Pulmonary effort is normal. No respiratory distress.     Breath sounds: No wheezing or rales.  Musculoskeletal:        General: Tenderness present. Normal range of motion.     Cervical back: Normal range of motion and neck supple.     Comments: Lateral mid back musculature tender to palpation without bony deformity  Lymphadenopathy:     Cervical: No cervical adenopathy.  Skin:    General: Skin is warm and dry.  Neurological:     Mental Status: He is alert and oriented to person, place, and time.  Psychiatric:        Behavior: Behavior normal.      UC Treatments / Results  Labs (all labs ordered are listed, but only abnormal results are displayed) Labs Reviewed  SARS CORONAVIRUS 2 (TAT 6-24 HRS)  POCT INFLUENZA A/B    EKG   Radiology No results found.  Procedures Procedures (including critical care time)  Medications Ordered in UC Medications - No data to display  Initial Impression / Assessment and Plan / UC Course  I have reviewed the triage vital signs and the nursing notes.  Pertinent labs & imaging results that were available during my care of the patient were reviewed by me and considered in my medical decision making (see chart for details).     Vitals and exam reassuring today, suspect viral respiratory infection causing asthma exacerbation.  He is also complaining of back soreness from the amount of coughing he is doing.  Will treat this with Zanaflex, albuterol, Phenergan DM, Flonase for URI symptoms.  Rapid flu negative, COVID testing pending.  Good candidate for Paxlovid if  positive.  Final Clinical Impressions(s) / UC Diagnoses   Final diagnoses:  Viral URI with cough  Acute bilateral thoracic back pain  Mild intermittent asthma with acute exacerbation     Discharge Instructions      Will call later today if your flu comes back positive.  COVID test should be back tomorrow.  Someone will call if positive.  I have sent over some medications to help with all of your symptoms, follow-up if worsening at any time.    ED Prescriptions     Medication Sig Dispense Auth. Provider   tiZANidine (ZANAFLEX) 4 MG capsule Take 1 capsule (4 mg total) by mouth 3 (three) times daily as needed for muscle spasms. Do not drink alcohol or drive while taking this medication.  May cause drowsiness. 15 capsule Particia Nearing, [place]   promethazine-dextromethorphan (PROMETHAZINE-DM) 6.25-15 MG/5ML syrup Take 5 mLs by mouth 4 (four) times daily as needed. 100 mL Particia Nearing, PA-C   fluticasone [facility]) 50 MCG/ACT nasal spray Place 1 spray into both nostrils 2 (two) times daily. 16 g Particia Nearing, [place]   albuterol (VENTOLIN HFA) 108 (90 Base) MCG/ACT inhaler Inhale 2 puffs into the lungs every 4 (four) hours as needed for wheezing or shortness of breath. 18 g Particia Nearing, [place]      PDMP not reviewed this encounter.   Particia Nearing, [place] 02/23/23 1735

## 2023-02-23 NOTE — Telephone Encounter (Signed)
Shelly from [facility] called stating Charles [name] insurance does not cover the Tizanidine capsules, requested to changed medication to Tizanidine tablets, okayed by provider to change to tablets.

## 2023-02-23 NOTE — Discharge Instructions (Signed)
Will call later today if your flu comes back positive.  COVID test should be back tomorrow.  Someone will call if positive.  I have sent over some medications to help with all of your symptoms, follow-up if worsening at any time.

## 2023-02-24 LAB — SARS CORONAVIRUS 2 (TAT 6-24 HRS): SARS Coronavirus 2: NEGATIVE

## 2023-02-25 ENCOUNTER — Encounter: Payer: Self-pay | Admitting: Orthopedic Surgery

## 2023-02-25 ENCOUNTER — Ambulatory Visit (INDEPENDENT_AMBULATORY_CARE_PROVIDER_SITE_OTHER): Payer: No Typology Code available for payment source | Admitting: Orthopedic Surgery

## 2023-02-25 VITALS — BP 111/73 | HR 82 | Ht 65.0 in | Wt 266.0 lb

## 2023-02-25 DIAGNOSIS — S8001XA Contusion of right knee, initial encounter: Secondary | ICD-10-CM

## 2023-02-25 DIAGNOSIS — S8991XA Unspecified injury of right lower leg, initial encounter: Secondary | ICD-10-CM | POA: Diagnosis not present

## 2023-02-25 NOTE — Progress Notes (Signed)
Follow-up appointment  Encounter Diagnoses  Name Primary?   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

## 2023-02-25 NOTE — Patient Instructions (Signed)
Central scheduling [phone]  OOW note to return to work on 03/15/2023

## 2023-03-01 DIAGNOSIS — F319 Bipolar disorder, unspecified: Secondary | ICD-10-CM | POA: Diagnosis not present

## 2023-03-01 DIAGNOSIS — Z6841 Body Mass Index (BMI) 40.0 and over, adult: Secondary | ICD-10-CM | POA: Diagnosis not present

## 2023-03-01 DIAGNOSIS — G4700 Insomnia, unspecified: Secondary | ICD-10-CM | POA: Diagnosis not present

## 2023-03-01 DIAGNOSIS — R030 Elevated blood-pressure reading, without diagnosis of hypertension: Secondary | ICD-10-CM | POA: Diagnosis not present

## 2023-03-09 ENCOUNTER — Telehealth: Payer: Self-pay | Admitting: Radiology

## 2023-03-09 NOTE — Telephone Encounter (Signed)
Called patient left message MRI not approved by [facility] it is being cancelled until we can get approval.

## 2023-03-09 NOTE — Telephone Encounter (Signed)
I spoke to patient to advise MRI not approved by [facility] yet, he states he is asking if Dr Romeo Apple will Clear him to return  to work full duty he feels like he is ready, he is able to walk better without any trouble and would like note  please advise.

## 2023-03-10 ENCOUNTER — Ambulatory Visit (HOSPITAL_COMMUNITY): Payer: Medicaid Other

## 2023-03-10 NOTE — Telephone Encounter (Signed)
Left message he can see the note in mychart and I left copy at desk

## 2023-03-10 NOTE — Telephone Encounter (Signed)
yes

## 2023-03-17 NOTE — Telephone Encounter (Signed)
-----   Message from [place] [facility] sent at 03/17/2023  7:51 AM EST ----- Regarding: Secure   WC patient Charles [name]  Reached out with WC rep., and he advised this below. Thx [name] 03-16-23  [name]   CLAIM  [id number]        DOI: 01-08-23  Re RT Knee [name], [name]  [place], [name]  #Caution - External email - see footer for warnings#  Hi,  He has not.  He told me he did not need it anymore as the doctor released him from care at full duty.  He said his knee is better now.

## 2023-05-07 ENCOUNTER — Other Ambulatory Visit: Payer: Self-pay

## 2023-05-07 ENCOUNTER — Emergency Department (HOSPITAL_COMMUNITY)
Admission: EM | Admit: 2023-05-07 | Discharge: 2023-05-07 | Disposition: A | Discharge status: Home / Self Care | Payer: Medicaid Other | Attending: Emergency Medicine | Admitting: Emergency Medicine

## 2023-05-07 ENCOUNTER — Encounter (HOSPITAL_COMMUNITY): Payer: Self-pay | Admitting: Emergency Medicine

## 2023-05-07 DIAGNOSIS — J45909 Unspecified asthma, uncomplicated: Secondary | ICD-10-CM | POA: Diagnosis not present

## 2023-05-07 DIAGNOSIS — Z91040 Latex allergy status: Secondary | ICD-10-CM | POA: Insufficient documentation

## 2023-05-07 DIAGNOSIS — Z7951 Long term (current) use of inhaled steroids: Secondary | ICD-10-CM | POA: Insufficient documentation

## 2023-05-07 DIAGNOSIS — R21 Rash and other nonspecific skin eruption: Secondary | ICD-10-CM | POA: Insufficient documentation

## 2023-05-07 MED ORDER — PREDNISONE 20 MG PO TABS: ORAL_TABLET | ORAL | 0 refills | Status: AC

## 2023-05-07 MED ORDER — HYDROXYZINE HCL 25 MG PO TABS: 25.0000 mg | ORAL_TABLET | Freq: Four times a day (QID) | ORAL | 0 refills | Status: AC | PRN

## 2023-05-07 MED ORDER — PREDNISONE 50 MG PO TABS
60.0000 mg | ORAL_TABLET | Freq: Once | ORAL | Status: AC
  Administered 2023-05-07: 60 mg via ORAL
  Filled 2023-05-07: qty 1

## 2023-05-07 MED ORDER — HYDROXYZINE HCL 25 MG PO TABS
25.0000 mg | ORAL_TABLET | Freq: Once | ORAL | Status: AC
  Administered 2023-05-07: 25 mg via ORAL
  Filled 2023-05-07: qty 1

## 2023-05-07 NOTE — ED Provider Notes (Signed)
 Charles [name]   CSN: [id number] Arrival date & time: 05/07/23  0246     History  Chief Complaint  Patient presents with   Rash    Charles [name] is a 32 y.o. male.  33 year old male with a history of asthma, allergies that presents ER today with diffuse rash mostly on his torso but some on his groin and upper arms as well.  States that started couple days ago.  Severely itching and with some burning.  No history of the same.  Patient states that no known history of eczema or allergic reactions or new allergens in the household.  No respiratory symptoms, GI symptoms or oral swelling.  Benadryl  without relief.  Tried Cetaphil at home which has made it burn worse. No known tick bites, outdoor activities or exposures.    Rash      Home Medications Prior to Admission medications   Medication Sig Start Date End Date Taking? Authorizing Provider  hydrOXYzine  (ATARAX ) 25 MG tablet Take 1 tablet (25 mg total) by mouth every 6 (six) hours as needed for itching. 05/07/23  Yes Danyal Adorno, Selinda, MD  predniSONE  (DELTASONE ) 20 MG tablet 3 tabs po daily x 3 days, then 2 tabs x 3 days, then 1.5 tabs x 3 days, then 1 tab x 3 days, then 0.5 tabs x 3 days 05/07/23  Yes Keileigh Vahey, Selinda, MD  acetaminophen  (TYLENOL ) 500 MG tablet Take 500 mg by mouth every 6 (six) hours as needed for mild pain (pain score 1-3) or fever.    [provider]  albuterol  (VENTOLIN  HFA) 108 (90 Base) MCG/ACT inhaler Inhale 2 puffs into the lungs every 4 (four) hours as needed for wheezing or shortness of breath. 02/23/23   Stuart Vernell Norris, PA-C  dicyclomine  (BENTYL ) 20 MG tablet Take 1 tablet (20 mg total) by mouth 4 (four) times daily -  before meals and at bedtime. 11/22/19   Knapp, Iva, MD  gabapentin (NEURONTIN) 400 MG capsule Take 400 mg by mouth 3 (three) times daily.    [provider]  ibuprofen  (ADVIL ) 600 MG tablet Take 1 tablet (600 mg total) by  mouth every 6 (six) hours as needed. Take with food 01/27/20   Triplett, Tammy, PA-C  levocetirizine (XYZAL ) 5 MG tablet Take 1 tablet (5 mg total) by mouth every evening. 07/09/21   Christopher Savannah, PA-C  metFORMIN (GLUCOPHAGE) 500 MG tablet Take by mouth 2 (two) times daily with a meal.    [provider]  naproxen  (NAPROSYN ) 500 MG tablet TAKE 1 TABLET BY MOUTH TWICE DAILY WITH A MEAL 02/21/23   Margrette Taft BRAVO, MD  prazosin (MINIPRESS) 5 MG capsule Take 5 mg by mouth at bedtime.    [provider]  sildenafil (VIAGRA) 25 MG tablet  12/02/22   [provider]      Allergies    Adhesive [tape], Amoxicillin, Codeine, Penicillins, and Latex    Review of Systems   Review of Systems  Skin:  Positive for rash.    Physical Exam Updated Vital Signs BP 134/75   Pulse 82   Temp 98.3 F (36.8 C)   Resp 18   Wt 120.7 kg   SpO2 94%   BMI 44.28 kg/m  Physical Exam Vitals and nursing [name] reviewed.  Constitutional:      Appearance: He is well-developed.  HENT:     Head: Normocephalic and atraumatic.  Cardiovascular:     Rate and Rhythm: Normal  rate.  Pulmonary:     Effort: Pulmonary effort is normal. No respiratory distress.  Abdominal:     General: There is no distension.  Musculoskeletal:        General: Normal range of motion.     Cervical back: Normal range of motion.  Skin:    General: Skin is warm and dry.     Findings: Erythema and rash (Diffuse papular rash some areas where seems to be coalesced possibly with some silver plaque looking areas as well.  Covering most of his anterior torso and some of his back and inguinal area.  Has a couple other areas in between fingers on both hands.) present.  Neurological:     Mental Status: He is alert.     ED Results / Procedures / Treatments   Labs (all labs ordered are listed, but only abnormal results are displayed) Labs Reviewed - No data to display  EKG None  Radiology No results  found.  Procedures Procedures    Medications Ordered in ED Medications  hydrOXYzine  (ATARAX ) tablet 25 mg (25 mg Oral Given 05/07/23 0529)  predniSONE  (DELTASONE ) tablet 60 mg (60 mg Oral Given 05/07/23 0529)    ED Course/ Medical Decision Making/ A&P                                 Medical Decision Making Risk Prescription drug management.   Patient has a history of allergies and asthma so eczema is a likely possibility however does have some features somewhat consistent with psoriasis with the silver plaques.  Either way the steroid should help some.  I recommended following up with a dermatologist for definitive answers in lieu of back in follow-up with PCP.  Low suspicion for Stevens-Johnson, meningococcemia, [place] spotted fever or other more acute etiologies for the rash requiring hospitalization, further workup or management at this time.  Final Clinical Impression(s) / ED Diagnoses Final diagnoses:  Rash    Rx / DC Orders ED Discharge Orders          Ordered    hydrOXYzine  (ATARAX ) 25 MG tablet  Every 6 hours PRN        05/07/23 0636    predniSONE  (DELTASONE ) 20 MG tablet        05/07/23 0636              Momen Ham, Selinda, MD 05/07/23 [phone]

## 2023-05-07 NOTE — ED Triage Notes (Signed)
 Pt in with itchy rash that first began a few weeks ago, predominantly to abdomen and groin. Pt states he believes it is psoriasis or eczema - Benadryl  not helping and he tried Ciprocel OTC cream tonight and the rash significantly worsened with burning pain. Denies any lip swelling, trouble swallowing or sob.

## 2023-05-07 NOTE — ED Notes (Signed)
 Discharge instructions reviewed.   Newly prescribed medications discussed. Pharmacy verified for accuracy.   Opportunity for questions and concerns provided.   Alert oriented and ambulatory. Displays no signs of distress.   Encouraged to follow up and seek dermatology consultation.

## 2023-05-07 NOTE — Discharge Instructions (Signed)
 It is difficult to tell what your rash is in the emergency setting. It seems most consistent with eczema based on your history of allergies, asthma, etc. However psoriasis could look similar. Only way to know for sure is to get a dermatology appointment however the medications prescribed in the ED will help with either one.

## 2023-06-03 ENCOUNTER — Other Ambulatory Visit: Payer: Self-pay

## 2023-06-03 ENCOUNTER — Encounter (HOSPITAL_COMMUNITY): Payer: Self-pay

## 2023-06-03 ENCOUNTER — Emergency Department (HOSPITAL_COMMUNITY): Payer: No Typology Code available for payment source

## 2023-06-03 ENCOUNTER — Emergency Department (HOSPITAL_COMMUNITY)
Admission: EM | Admit: 2023-06-03 | Discharge: 2023-06-03 | Disposition: A | Discharge status: Home / Self Care | Payer: No Typology Code available for payment source | Attending: Emergency Medicine | Admitting: Emergency Medicine

## 2023-06-03 DIAGNOSIS — S299XXA Unspecified injury of thorax, initial encounter: Secondary | ICD-10-CM | POA: Diagnosis present

## 2023-06-03 DIAGNOSIS — Z91040 Latex allergy status: Secondary | ICD-10-CM | POA: Diagnosis not present

## 2023-06-03 DIAGNOSIS — M25511 Pain in right shoulder: Secondary | ICD-10-CM | POA: Diagnosis not present

## 2023-06-03 DIAGNOSIS — Y92481 Parking lot as the place of occurrence of the external cause: Secondary | ICD-10-CM | POA: Diagnosis not present

## 2023-06-03 DIAGNOSIS — Z7984 Long term (current) use of oral hypoglycemic drugs: Secondary | ICD-10-CM | POA: Insufficient documentation

## 2023-06-03 DIAGNOSIS — S20211A Contusion of right front wall of thorax, initial encounter: Secondary | ICD-10-CM | POA: Insufficient documentation

## 2023-06-03 DIAGNOSIS — E119 Type 2 diabetes mellitus without complications: Secondary | ICD-10-CM | POA: Diagnosis not present

## 2023-06-03 MED ORDER — IBUPROFEN 800 MG PO TABS
800.0000 mg | ORAL_TABLET | Freq: Once | ORAL | Status: AC
  Administered 2023-06-03: 800 mg via ORAL
  Filled 2023-06-03: qty 1

## 2023-06-03 NOTE — Discharge Instructions (Addendum)
Your x-rays were unremarkable, I believe that you are just very badly bruised.  Please return to the ER if you have worsening pain, bruising, or any shortness of breath.  Take Tylenol and ibuprofen for the pain
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
## 2023-08-18 ENCOUNTER — Other Ambulatory Visit: Payer: Self-pay

## 2023-08-18 ENCOUNTER — Encounter (HOSPITAL_COMMUNITY): Payer: Self-pay | Admitting: *Deleted

## 2023-08-18 ENCOUNTER — Emergency Department (HOSPITAL_COMMUNITY)
Admission: EM | Admit: 2023-08-18 | Discharge: 2023-08-18 | Disposition: A | Discharge status: Home / Self Care | Attending: Emergency Medicine | Admitting: Emergency Medicine

## 2023-08-18 ENCOUNTER — Emergency Department (HOSPITAL_COMMUNITY)

## 2023-08-18 DIAGNOSIS — H6690 Otitis media, unspecified, unspecified ear: Secondary | ICD-10-CM

## 2023-08-18 DIAGNOSIS — D72829 Elevated white blood cell count, unspecified: Secondary | ICD-10-CM | POA: Diagnosis not present

## 2023-08-18 DIAGNOSIS — H6691 Otitis media, unspecified, right ear: Secondary | ICD-10-CM | POA: Insufficient documentation

## 2023-08-18 DIAGNOSIS — H9201 Otalgia, right ear: Secondary | ICD-10-CM | POA: Diagnosis present

## 2023-08-18 DIAGNOSIS — J029 Acute pharyngitis, unspecified: Secondary | ICD-10-CM | POA: Diagnosis not present

## 2023-08-18 LAB — CBC WITH DIFFERENTIAL/PLATELET
Abs Immature Granulocytes: 0.05 10*3/uL (ref 0.00–0.07)
Basophils Absolute: 0.1 10*3/uL (ref 0.0–0.1)
Basophils Relative: 1 %
Eosinophils Absolute: 0.3 10*3/uL (ref 0.0–0.5)
Eosinophils Relative: 2 %
HCT: 46.7 % (ref 39.0–52.0)
Hemoglobin: 15.6 g/dL (ref 13.0–17.0)
Immature Granulocytes: 0 %
Lymphocytes Relative: 10 %
Lymphs Abs: 1.3 10*3/uL (ref 0.7–4.0)
MCH: 30.3 pg (ref 26.0–34.0)
MCHC: 33.4 g/dL (ref 30.0–36.0)
MCV: 90.7 fL (ref 80.0–100.0)
Monocytes Absolute: 0.9 10*3/uL (ref 0.1–1.0)
Monocytes Relative: 7 %
Neutro Abs: 10.4 10*3/uL — ABNORMAL HIGH (ref 1.7–7.7)
Neutrophils Relative %: 80 %
Platelets: 171 10*3/uL (ref 150–400)
RBC: 5.15 MIL/uL (ref 4.22–5.81)
RDW: 13.2 % (ref 11.5–15.5)
WBC: 13 10*3/uL — ABNORMAL HIGH (ref 4.0–10.5)
nRBC: 0 % (ref 0.0–0.2)

## 2023-08-18 LAB — BASIC METABOLIC PANEL WITH GFR
Anion gap: 10 (ref 5–15)
BUN: 14 mg/dL (ref 6–20)
CO2: 25 mmol/L (ref 22–32)
Calcium: 9.3 mg/dL (ref 8.9–10.3)
Chloride: 99 mmol/L (ref 98–111)
Creatinine, Ser: 0.98 mg/dL (ref 0.61–1.24)
GFR, Estimated: >60 mL/min (ref >60)
Glucose, Bld: 103 mg/dL — ABNORMAL HIGH (ref 70–99)
Potassium: 4.2 mmol/L (ref 3.5–5.1)
Sodium: 134 mmol/L — ABNORMAL LOW (ref 135–145)

## 2023-08-18 MED ORDER — LEVOFLOXACIN 500 MG PO TABS: 500.0000 mg | ORAL_TABLET | Freq: Once | ORAL | Status: DC

## 2023-08-18 MED ORDER — KETOROLAC TROMETHAMINE 30 MG/ML IJ SOLN
30.0000 mg | Freq: Once | INTRAMUSCULAR | Status: AC
  Administered 2023-08-18: 30 mg via INTRAVENOUS
  Filled 2023-08-18: qty 1

## 2023-08-18 MED ORDER — AZITHROMYCIN 250 MG PO TABS
250.0000 mg | ORAL_TABLET | Freq: Every day | ORAL | 0 refills | Status: AC
Start: 2023-08-18 — End: ?

## 2023-08-18 MED ORDER — AZITHROMYCIN 250 MG PO TABS
500.0000 mg | ORAL_TABLET | Freq: Once | ORAL | Status: AC
  Administered 2023-08-18: 500 mg via ORAL
  Filled 2023-08-18: qty 2

## 2023-08-18 MED ORDER — IOHEXOL 300 MG/ML  SOLN
75.0000 mL | Freq: Once | INTRAMUSCULAR | Status: AC | PRN
  Administered 2023-08-18: 75 mL via INTRAVENOUS

## 2023-08-18 NOTE — Discharge Instructions (Signed)
 You have been given your first dose of antibiotics today.  I recommend that you start the prescription antibiotics tomorrow.  Please take as directed until finished.  You may take Tylenol or ibuprofen as directed if needed for fever.  Drink plenty of fluids.  Follow-up with your primary care provider within 1 week for recheck.  I have also listed a ear nose and throat provider that you may contact to arrange follow-up appointment.  Return to emergency department if you develop any new or worsening symptoms.

## 2023-08-18 NOTE — ED Provider Notes (Signed)
 [place] EMERGENCY DEPARTMENT AT [facility] Provider Note   CSN: [id number] Arrival date & time: 08/18/23  4098     History  Chief Complaint  Patient presents with   Otalgia    Charles [name] is a 33 y.o. male.   Otalgia Associated symptoms: neck pain and sore throat   Associated symptoms: no abdominal pain, no congestion, no cough, no diarrhea, no fever, no headaches, no hearing loss, no rash and no vomiting        Charles [name] is a 33 y.o. male has medical history of type 2 diabetes who presents to the Emergency Department complaining of right ear pain and throat pain for 2 days.  Symptoms began with ear pain.  Worse this morning upon waking.  He describes having feeling of swelling around his ear that is now radiating down into his right neck and feels that his throat is tight.  He has pain with swallowing.  Able to handle his own secretions.  Denies any fever.  Feels that his glands are swollen in his right neck.  Denies headache or stiffness of his neck.  No cough, nasal congestion or rhinorrhea.  No decreased hearing or dizziness.  Home Medications Prior to Admission medications   Medication Sig Start Date End Date Taking? Authorizing Provider  acetaminophen  (TYLENOL ) 500 MG tablet Take 500 mg by mouth every 6 (six) hours as needed for mild pain (pain score 1-3) or fever.    [provider]  albuterol  (VENTOLIN  HFA) 108 (90 Base) MCG/ACT inhaler Inhale 2 puffs into the lungs every 4 (four) hours as needed for wheezing or shortness of breath. 02/23/23   Corbin Dess, PA-C  dicyclomine  (BENTYL ) 20 MG tablet Take 1 tablet (20 mg total) by mouth 4 (four) times daily -  before meals and at bedtime. 11/22/19   Knapp, Iva, MD  gabapentin (NEURONTIN) 400 MG capsule Take 400 mg by mouth 3 (three) times daily.    [provider]  hydrOXYzine  (ATARAX ) 25 MG tablet Take 1 tablet (25 mg total) by mouth every 6 (six) hours as needed for itching. 05/07/23    Mesner, Reymundo Caulk, MD  ibuprofen  (ADVIL ) 600 MG tablet Take 1 tablet (600 mg total) by mouth every 6 (six) hours as needed. Take with food 01/27/20   Shawnee Gambone, PA-C  levocetirizine (XYZAL ) 5 MG tablet Take 1 tablet (5 mg total) by mouth every evening. 07/09/21   Adolph Hoop, PA-C  metFORMIN (GLUCOPHAGE) 500 MG tablet Take by mouth 2 (two) times daily with a meal.    [provider]  naproxen  (NAPROSYN ) 500 MG tablet TAKE 1 TABLET BY MOUTH TWICE DAILY WITH A MEAL 02/21/23   Darrin Emerald, MD  prazosin (MINIPRESS) 5 MG capsule Take 5 mg by mouth at bedtime.    [provider]  predniSONE  (DELTASONE ) 20 MG tablet 3 tabs po daily x 3 days, then 2 tabs x 3 days, then 1.5 tabs x 3 days, then 1 tab x 3 days, then 0.5 tabs x 3 days 05/07/23   Mesner, Reymundo Caulk, MD  sildenafil (VIAGRA) 25 MG tablet  12/02/22   [provider]      Allergies    Adhesive [tape], Amoxicillin, Codeine, Penicillins, and Latex    Review of Systems   Review of Systems  Constitutional:  Negative for chills and fever.  HENT:  Positive for ear pain, sore throat and trouble swallowing. Negative for congestion, facial swelling and hearing loss.   Respiratory:  Negative for cough and shortness of breath.   Cardiovascular:  Negative for chest pain.  Gastrointestinal:  Negative for abdominal pain, diarrhea, nausea and vomiting.  Musculoskeletal:  Positive for neck pain. Negative for neck stiffness.  Skin:  Negative for rash.  Neurological:  Negative for dizziness, weakness, numbness and headaches.    Physical Exam Updated Vital Signs BP (!) 144/95 (BP Location: Right Arm)   Pulse 99   Temp 98.3 F (36.8 C) (Oral)   Resp 20   Ht 5\' 5"  (1.651 m)   Wt 124.7 kg   SpO2 99%   BMI 45.76 kg/m  Physical Exam Vitals and nursing note reviewed.  Constitutional:      General: He is not in acute distress.    Appearance: He is not toxic-appearing.  HENT:     Right Ear: Ear canal normal. No decreased  hearing noted. Tenderness present. No drainage. No middle ear effusion. No mastoid tenderness. No hemotympanum. Tympanic membrane is erythematous and bulging.     Left Ear: Tympanic membrane and ear canal normal.     Mouth/Throat:     Pharynx: Uvula midline. Pharyngeal swelling and posterior oropharyngeal erythema present. No oropharyngeal exudate or uvula swelling.     Comments: Diffuse erythema of the oropharynx.  Uvula midline nonedematous.  No trismus.  Do not appreciate any oral exudates.  No unilateral bulging of the soft palate.  Patient is handling his secretions well.  No hot potato voice Cardiovascular:     Rate and Rhythm: Normal rate and regular rhythm.     Pulses: Normal pulses.  Pulmonary:     Effort: Pulmonary effort is normal.     Breath sounds: Normal breath sounds.  Chest:     Chest wall: No tenderness.  Musculoskeletal:        General: Normal range of motion.     Cervical back: Tenderness present. No rigidity.  Lymphadenopathy:     Cervical: Cervical adenopathy present.  Skin:    General: Skin is warm.     Capillary Refill: Capillary refill takes less than 2 seconds.     Findings: No rash.  Neurological:     Mental Status: He is alert.     ED Results / Procedures / Treatments   Labs (all labs ordered are listed, but only abnormal results are displayed) Labs Reviewed  CBC WITH DIFFERENTIAL/PLATELET - Abnormal; Notable for the following components:      Result Value   WBC 13.0 (*)    Neutro Abs 10.4 (*)    All other components within normal limits  BASIC METABOLIC PANEL WITH GFR - Abnormal; Notable for the following components:   Sodium 134 (*)    Glucose, Bld 103 (*)    All other components within normal limits    EKG None  Radiology CT Soft Tissue Neck W Contrast Result Date: 08/18/2023 CLINICAL DATA:  33 year old male with sore throat, difficulty swallowing, right ear pain. EXAM: CT NECK WITH CONTRAST TECHNIQUE: Multidetector CT imaging of the neck  was performed using the standard protocol following the bolus administration of intravenous contrast. RADIATION DOSE REDUCTION: This exam was performed according to the departmental dose-optimization program which includes automated exposure control, adjustment of the mA and/or kV according to patient size and/or use of iterative reconstruction technique. CONTRAST:  75mL OMNIPAQUE  IOHEXOL  300 MG/ML  SOLN COMPARISON:  Cervical spine CT 10/13/2016. FINDINGS: Pharynx and larynx: Larynx, epiglottis are normal. Lingual tonsil hypertrophy similar to the 2018 CT, and might be in the setting  of previous palatine tonsillectomy, uncertain. Pharynx soft tissue contours and enhancement are within normal limits. Negative parapharyngeal and retropharyngeal spaces. Salivary glands: Negative.  Negative sublingual space. Thyroid: Negative. Lymph nodes: Bilateral level 2 lymph nodes are 10-12 mm short axis, at the upper limits of normal to mildly enlarged. But the other bilateral cervical lymph nodes are subcentimeter, stable since 2018, and within normal limits. No cystic or necrotic nodes. Vascular: Suboptimal intravascular contrast bolus but the major vascular structures in the neck and at the skull base appear grossly patent. Tortuous cervical ICAs. No significant atherosclerosis identified. Limited intracranial: Negative. Visualized orbits: Negative. Mastoids and visualized paranasal sinuses: Chronic paranasal sinusitis with bilateral mucoperiosteal thickening. Bilateral sinus fluid and fluid levels superimposed. Appearance is similar to the 2018 CT. Complete opacification of the right middle ear and mastoids, with only a mild right mastoid effusion in 2018. Contralateral left middle ear and mastoids remain clear. No associated bone erosion is identified. But there does appear to be asymmetric right external auditory canal soft tissue thickening. Skeleton: Carious dentition throughout. No acute osseous abnormality identified.  Upper chest: Negative. IMPRESSION: 1. Complete opacification of the Right middle ear and mastoids, with asymmetric right external auditory canal soft tissue thickening. Suspect Acute Otitis Media and/or Externa with mastoid effusion. No bone erosion is identified. 2. 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 And underlying carious dentition. 3. Reactive appearing bilateral level 2 cervical lymph nodes. But otherwise negative CT appearance of the Neck. Electronically Signed   By: Marlise Simpers M.D.   On: 08/18/2023 12:58    Procedures Procedures    Medications Ordered in ED Medications  ketorolac  (TORADOL ) 30 MG/ML injection 30 mg (30 mg Intravenous Given 08/18/23 1020)  iohexol  (OMNIPAQUE ) 300 MG/ML solution 75 mL (75 mLs Intravenous Contrast Given 08/18/23 1159)  azithromycin  (ZITHROMAX ) tablet 500 mg (500 mg Oral Given 08/18/23 1402)    ED Course/ Medical Decision Making/ A&P                                 Medical Decision Making Patient here with complaint of right ear pain, right neck pain sore throat and pain with swallowing.  Symptoms present for 2 days.  History of prior tonsillectomy.  No fever.  No nausea vomiting  Pt is nontoxic-appearing.  Handling his secretions well.  I do not appreciate any bulging of the soft palate uvula is midline and nonedematous.  No trismus no oropharyngeal exudate.  Patient does have right otitis media on exam.  I do not appreciate any mastoid tenderness.  He appears to be handling his secretions well.  Concern for retropharyngeal abscess.  Will obtain labs and CT imaging.  Amount and/or Complexity of Data Reviewed Labs: ordered.    Details: Mild leukocytosis otherwise labs were unremarkable Radiology: ordered.    Details: CT soft tissue of neck shows opacification of the right middle ear and mastoids with likely acute otitis media with mastoid effusion.  No bony erosion is identified.  Reactive appearing bilateral cervical lymph  nodes Discussion of management or test interpretation with external provider(s): Patient has reported anaphylactic reaction to penicillins.  Consulted with pharmacy regarding proper antibiotic.  Patient will be given Zithromax  here along with prescription.  He reports feeling better after receiving IV Toradol .  States his swelling of his neck and throat have much improved.  Tolerating liquids without difficulty. Rx written, will be given ENT f/u.  Strict return  precautions also given  I recommended close outpatient follow-up in 1 week with his PCP also will provide referral information for ENT and he was given strict ER return precautions.  Risk Prescription drug management.           Final Clinical Impression(s) / ED Diagnoses Final diagnoses:  Acute otitis media, unspecified otitis media type  Pharyngitis, unspecified etiology    Rx / DC Orders ED Discharge Orders     None         Catherne Clubs, PA-C 08/20/23 1101    Cheyenne Cotta, MD 08/23/23 1034

## 2023-08-18 NOTE — ED Triage Notes (Signed)
 Pt c/o right ear pain that started 2 days ago, worsened this morning. Pt reports this morning he noticed swelling around the ear and down into what feels like his throat. He reports he can't swallow anything without pain.

## 2023-08-18 NOTE — ED Notes (Signed)
 Pt ambulatory to waiting room. Pt verbalized understanding of discharge instructions.

## 2024-01-10 ENCOUNTER — Ambulatory Visit: Admission: EM | Admit: 2024-01-10 | Discharge: 2024-01-10 | Disposition: A | Discharge status: Home / Self Care

## 2024-01-10 DIAGNOSIS — B349 Viral infection, unspecified: Secondary | ICD-10-CM

## 2024-01-10 LAB — POC COVID19/FLU A&B COMBO
Covid Antigen, POC: NEGATIVE
Influenza A Antigen, POC: NEGATIVE
Influenza B Antigen, POC: NEGATIVE

## 2024-01-10 MED ORDER — PROMETHAZINE-DM 6.25-15 MG/5ML PO SYRP
5.0000 mL | ORAL_SOLUTION | Freq: Four times a day (QID) | ORAL | 0 refills | Status: AC | PRN
Start: 2024-01-10 — End: ?

## 2024-01-10 MED ORDER — AZELASTINE HCL 0.1 % NA SOLN
1.0000 | Freq: Two times a day (BID) | NASAL | 1 refills | Status: AC
Start: 2024-01-10 — End: ?

## 2024-01-10 MED ORDER — ONDANSETRON 4 MG PO TBDP
4.0000 mg | ORAL_TABLET | Freq: Three times a day (TID) | ORAL | 0 refills | Status: AC | PRN
Start: 2024-01-10 — End: ?

## 2024-01-10 NOTE — ED Provider Notes (Signed)
 [facility]   Note:  This document was prepared using Dragon voice recognition software and may include unintentional dictation errors.  MRN: [id number] DOB: 1990-09-25  Subjective:   Charles [name] is a 33 y.o. male presenting for body aches, chills, night sweats, nausea/vomiting, diarrhea, cough, nasal congestion x 1 day.  Patient denies any known sick contacts.  Patient reports that symptoms began yesterday afternoon after work.  Patient has not taken any over-the-counter medication to treat symptoms.  Patient was sent home from work today due to vomiting.  Patient states that cough and nasal congestion is worse at night while trying to sleep.  No shortness of breath, chest pain, weakness, dizziness.  No current facility-administered medications for this encounter.  Current Outpatient Medications:    azelastine  (ASTELIN ) 0.1 % nasal spray, Place 1 spray into both nostrils 2 (two) times daily. Use in each nostril as directed, Disp: 30 mL, Rfl: 1   mirtazapine (REMERON) 15 MG tablet, Take 15 mg by mouth at bedtime., Disp: , Rfl:    ondansetron  (ZOFRAN -ODT) 4 MG disintegrating tablet, Take 1 tablet (4 mg total) by mouth every 8 (eight) hours as needed for nausea or vomiting., Disp: 20 tablet, Rfl: 0   promethazine -dextromethorphan (PROMETHAZINE -DM) 6.25-15 MG/5ML syrup, Take 5 mLs by mouth 4 (four) times daily as needed for cough., Disp: 118 mL, Rfl: 0   acetaminophen  (TYLENOL ) 500 MG tablet, Take 500 mg by mouth every 6 (six) hours as needed for mild pain (pain score 1-3) or fever., Disp: , Rfl:    albuterol  (VENTOLIN  HFA) 108 (90 Base) MCG/ACT inhaler, Inhale 2 puffs into the lungs every 4 (four) hours as needed for wheezing or shortness of breath., Disp: 18 g, Rfl: 0   azithromycin  (ZITHROMAX ) 250 MG tablet, Take 1 tablet (250 mg total) by mouth daily. Take first 2 tablets together, then 1 every day until finished., Disp: 6 tablet, Rfl: 0   dicyclomine  (BENTYL ) 20 MG tablet, Take 1  tablet (20 mg total) by mouth 4 (four) times daily -  before meals and at bedtime., Disp: 40 tablet, Rfl: 0   gabapentin (NEURONTIN) 400 MG capsule, Take 400 mg by mouth 3 (three) times daily., Disp: , Rfl:    hydrOXYzine  (ATARAX ) 25 MG tablet, Take 1 tablet (25 mg total) by mouth every 6 (six) hours as needed for itching., Disp: 30 tablet, Rfl: 0   ibuprofen  (ADVIL ) 600 MG tablet, Take 1 tablet (600 mg total) by mouth every 6 (six) hours as needed. Take with food, Disp: 21 tablet, Rfl: 0   levocetirizine (XYZAL ) 5 MG tablet, Take 1 tablet (5 mg total) by mouth every evening., Disp: 90 tablet, Rfl: 0   metFORMIN (GLUCOPHAGE) 500 MG tablet, Take by mouth 2 (two) times daily with a meal., Disp: , Rfl:    naproxen  (NAPROSYN ) 500 MG tablet, TAKE 1 TABLET BY MOUTH TWICE DAILY WITH A MEAL, Disp: 60 tablet, Rfl: 0   prazosin (MINIPRESS) 5 MG capsule, Take 5 mg by mouth at bedtime., Disp: , Rfl:    predniSONE  (DELTASONE ) 20 MG tablet, 3 tabs po daily x 3 days, then 2 tabs x 3 days, then 1.5 tabs x 3 days, then 1 tab x 3 days, then 0.5 tabs x 3 days, Disp: 27 tablet, Rfl: 0   sildenafil (VIAGRA) 25 MG tablet, , Disp: , Rfl:    Allergies  Allergen Reactions   Adhesive [Tape] Rash   Amoxicillin Anaphylaxis and Other (See Comments)    Has patient had  a PCN reaction causing immediate rash, facial/tongue/throat swelling, SOB or lightheadedness with hypotension: Yes Has patient had a PCN reaction causing severe rash involving mucus membranes or skin necrosis: No Has patient had a PCN reaction that required hospitalization: No Has patient had a PCN reaction occurring within the last 10 years: Yes If all of the above answers are NO, then may proceed with Cephalosporin use.   Codeine Shortness Of Breath   Penicillins Anaphylaxis and Other (See Comments)    Has patient had a PCN reaction causing immediate rash, facial/tongue/throat swelling, SOB or lightheadedness with hypotension: Yes Has patient had a PCN  reaction causing severe rash involving mucus membranes or skin necrosis: No Has patient had a PCN reaction that required hospitalization: No Has patient had a PCN reaction occurring within the last 10 years: Yes If all of the above answers are NO, then may proceed with Cephalosporin use.   Latex Rash    Past Medical History:  Diagnosis Date   Anxiety    Asthma    Back pain    Diabetes mellitus without complication (HCC)    PTSD (post-traumatic stress disorder)    Rib pain      Past Surgical History:  Procedure Laterality Date   ADENOIDECTOMY     TONSILLECTOMY      Family History  Problem Relation Age of Onset   Healthy Mother    Alcohol abuse Father    Drug abuse Father     Social History   Tobacco Use   Smoking status: Every Day    Current packs/day: 1.00    Average packs/day: 1 pack/day for 10.0 years (10.0 ttl pk-yrs)    Types: Cigarettes   Smokeless tobacco: Former    Types: Snuff  Vaping Use   Vaping status: Never Used  Substance Use Topics   Alcohol use: Yes    Comment: bourbon once in a blue moon   Drug use: Not Currently    Comment: former quit in 2012    ROS Refer to HPI for ROS details.  Objective:   Vitals: BP 106/75 (BP Location: Right Arm)   Pulse (!) 115   Temp 98.8 F (37.1 C) (Oral)   Resp 16   SpO2 96%   Physical Exam Vitals and nursing note reviewed.  Constitutional:      General: He is not in acute distress.    Appearance: Normal appearance. He is well-developed. He is not ill-appearing or toxic-appearing.  HENT:     Head: Normocephalic.     Nose: Congestion present. No rhinorrhea.     Mouth/Throat:     Mouth: Mucous membranes are moist.  Eyes:     Extraocular Movements: Extraocular movements intact.     Conjunctiva/sclera: Conjunctivae normal.  Cardiovascular:     Rate and Rhythm: Normal rate and regular rhythm.     Heart sounds: Normal heart sounds. No murmur heard. Pulmonary:     Effort: Pulmonary effort is  normal. No respiratory distress.     Breath sounds: Normal breath sounds. No stridor. No wheezing, rhonchi or rales.  Chest:     Chest wall: No tenderness.  Abdominal:     Palpations: Abdomen is soft.     Tenderness: There is no abdominal tenderness. There is no right CVA tenderness or left CVA tenderness.  Skin:    General: Skin is warm and dry.  Neurological:     General: No focal deficit present.     Mental Status: He is alert and oriented to  person, place, and time.  Psychiatric:        Mood and Affect: Mood normal.        Behavior: Behavior normal.     Procedures  Results for orders placed or performed during the hospital encounter of 01/10/24 (from the past 24 hours)  POC Covid19/Flu A&B Antigen     Status: None   Collection Time: 01/10/24  9:18 AM  Result Value Ref Range   Influenza A Antigen, POC Negative Negative   Influenza B Antigen, POC Negative Negative   Covid Antigen, POC Negative Negative    No results found.   Assessment and Plan :     Discharge Instructions       1. Acute viral syndrome (Primary) - POC Covid19/Flu A&B Antigen completed today in UC is negative for COVID and influenza. - azelastine  (ASTELIN ) 0.1 % nasal spray; Place 1 spray into both nostrils 2 (two) times daily. Use in each nostril as directed  Dispense: 30 mL; Refill: 1 - ondansetron  (ZOFRAN -ODT) 4 MG disintegrating tablet; Take 1 tablet (4 mg total) by mouth every 8 (eight) hours as needed for nausea or vomiting.  Dispense: 20 tablet; Refill: 0 - promethazine -dextromethorphan (PROMETHAZINE -DM) 6.25-15 MG/5ML syrup; Take 5 mLs by mouth 4 (four) times daily as needed for cough.  Dispense: 118 mL; Refill: 0 - Complete home COVID test on Thursday before returning to work to ensure that you do not have COVID. -Continue to monitor symptoms for any change in severity if there is any escalation of current symptoms or development of new symptoms follow-up in ER for further evaluation and  management.      Aliha Diedrich B Tyjae Shvartsman   Ozzie Remmers, [place], [place] 01/10/24 [phone]

## 2024-01-10 NOTE — ED Triage Notes (Signed)
 Pt reports he has body aches, chills, sweats n/v diarrhea x 1 day

## 2024-01-10 NOTE — Discharge Instructions (Signed)
  1. Acute viral syndrome (Primary) - POC Covid19/Flu A&B Antigen completed today in UC is negative for COVID and influenza. - azelastine  (ASTELIN ) 0.1 % nasal spray; Place 1 spray into both nostrils 2 (two) times daily. Use in each nostril as directed  Dispense: 30 mL; Refill: 1 - ondansetron  (ZOFRAN -ODT) 4 MG disintegrating tablet; Take 1 tablet (4 mg total) by mouth every 8 (eight) hours as needed for nausea or vomiting.  Dispense: 20 tablet; Refill: 0 - promethazine -dextromethorphan (PROMETHAZINE -DM) 6.25-15 MG/5ML syrup; Take 5 mLs by mouth 4 (four) times daily as needed for cough.  Dispense: 118 mL; Refill: 0 - Complete home COVID test on Thursday before returning to work to ensure that you do not have COVID. -Continue to monitor symptoms for any change in severity if there is any escalation of current symptoms or development of new symptoms follow-up in ER for further evaluation and management.

## 2024-02-17 ENCOUNTER — Ambulatory Visit: Payer: Medicaid Other | Admitting: Orthopedic Surgery
